# Patient Record
Sex: Male | Born: 1955 | Race: White | Hispanic: No | Marital: Single | Smoking: Former smoker
Health system: Southern US, Community
[De-identification: ages and names within clinical notes are randomized; demographics above are authoritative.]

## PROBLEM LIST (undated history)

## (undated) DIAGNOSIS — I10 Essential (primary) hypertension: Secondary | ICD-10-CM

## (undated) DIAGNOSIS — E785 Hyperlipidemia, unspecified: Secondary | ICD-10-CM

## (undated) DIAGNOSIS — I2510 Atherosclerotic heart disease of native coronary artery without angina pectoris: Secondary | ICD-10-CM

## (undated) DIAGNOSIS — I209 Angina pectoris, unspecified: Secondary | ICD-10-CM

## (undated) DIAGNOSIS — N289 Disorder of kidney and ureter, unspecified: Secondary | ICD-10-CM

## (undated) DIAGNOSIS — I219 Acute myocardial infarction, unspecified: Secondary | ICD-10-CM

## (undated) HISTORY — PX: CORONARY ANGIOPLASTY WITH STENT PLACEMENT: SHX49

## (undated) HISTORY — PX: CAROTID ENDARTERECTOMY: SUR193

## (undated) HISTORY — PX: LITHOTRIPSY: SUR834

---

## 2004-07-07 ENCOUNTER — Emergency Department: Payer: Self-pay | Admitting: Emergency Medicine

## 2004-07-07 ENCOUNTER — Other Ambulatory Visit: Payer: Self-pay

## 2004-07-24 ENCOUNTER — Emergency Department: Payer: Self-pay | Admitting: Internal Medicine

## 2008-03-19 ENCOUNTER — Emergency Department: Payer: Self-pay | Admitting: Emergency Medicine

## 2008-03-27 ENCOUNTER — Ambulatory Visit: Payer: Self-pay | Admitting: Specialist

## 2008-05-02 ENCOUNTER — Ambulatory Visit: Payer: Self-pay | Admitting: Specialist

## 2008-05-09 ENCOUNTER — Ambulatory Visit: Payer: Self-pay | Admitting: Specialist

## 2010-11-22 IMAGING — CT CT STONE STUDY
1 of 2 series · 15 of 32 positions shown, 19 images · non-contrast
Comparison: none

RESULT:      Emergent noncontrast CT of the abdomen and pelvis is performed
utilizing a renal stone protocol. Images were reconstructed in the axial
plane at 3 mm slice thickness. Multiplanar and 3 dimensional reconstructions
are performed at the time of dictation utilizing the web space software area
there is no previous exam for comparison.

The prostate is enlarged and contains calcifications. The urinary bladder is
not significantly distended. There are no bladder calculi evident. There is
no urinary obstruction. There is no ascites, abnormal bowel distention,
abnormal fluid collection or free air. The patient does have an infrarenal
abdominal aortic aneurysm demonstrating an AP dimension of 2.6 cm with a
transverse dimension of 2.7 cm. This tapers to normal diameter prior to the
aortic bifurcation. No definite urinary tract stones are evident.
The upper abdominal structures are otherwise unremarkable. The adrenal
glands are normal. .The cecum is anterior in position just to the right of
midline there is no evidence of acute appendicitis. There is no adenopathy.

[Series 2: stone · axial · 0.74mm/px · z∈[-1028,-632]mm · 15 of 150 slices shown, 19 images]
[im 12/150  soft-tissue]
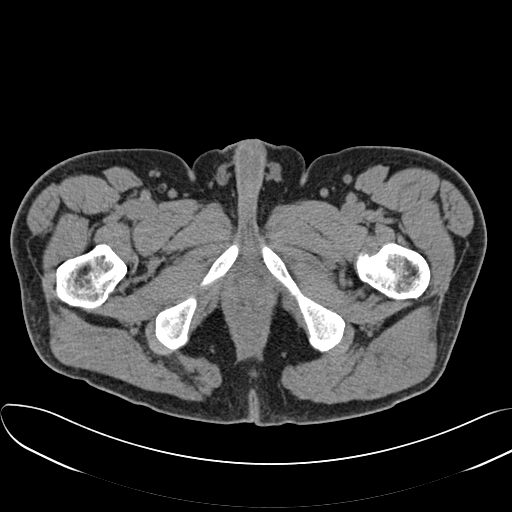
[im 12/150  bone]
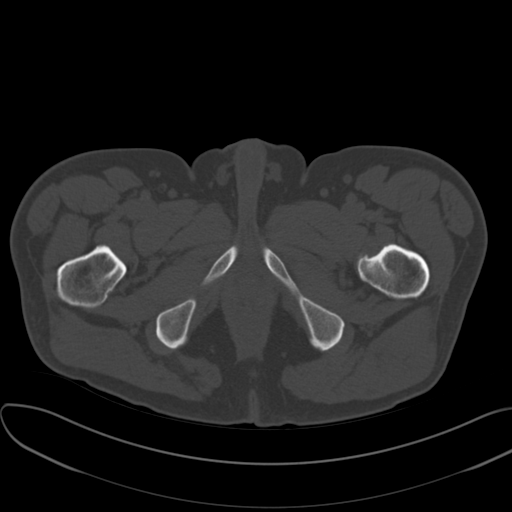
[im 23/150  soft-tissue]
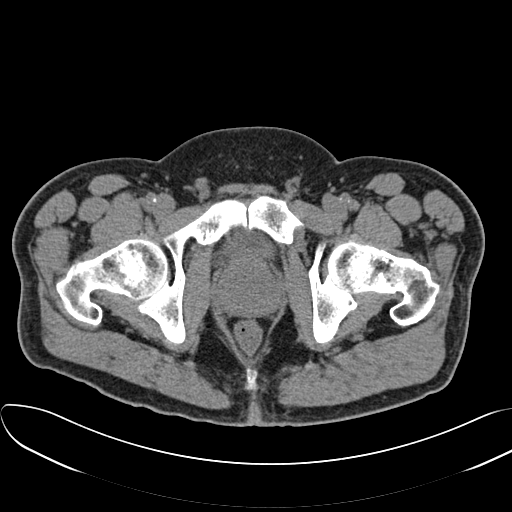
[im 34/150  soft-tissue]
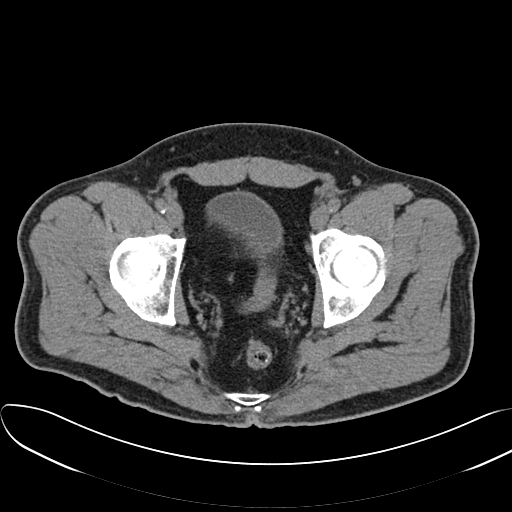
[im 45/150  soft-tissue]
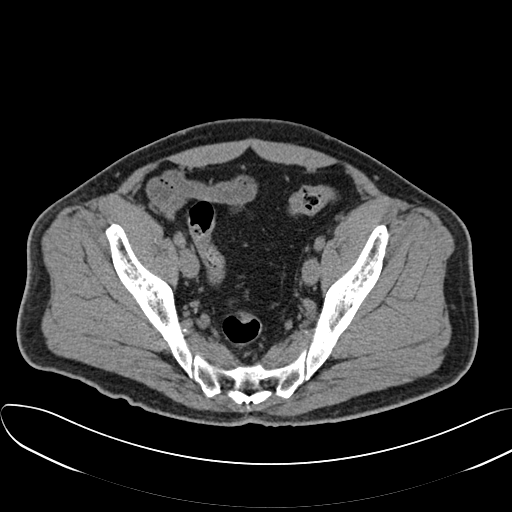
[im 56/150  soft-tissue]
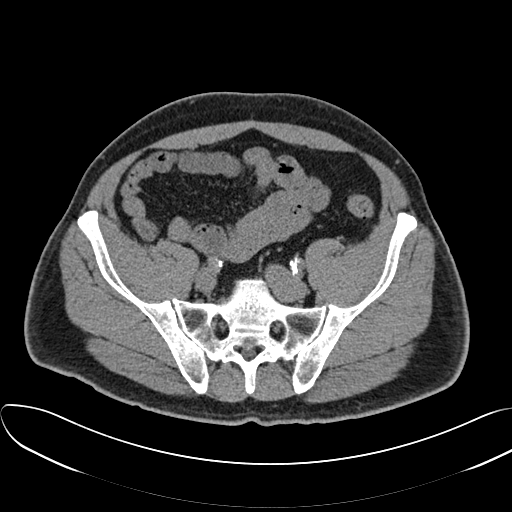
[im 67/150  soft-tissue]
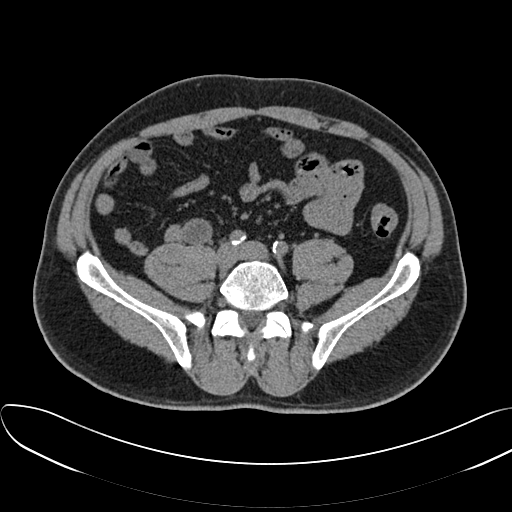
[im 78/150  soft-tissue]
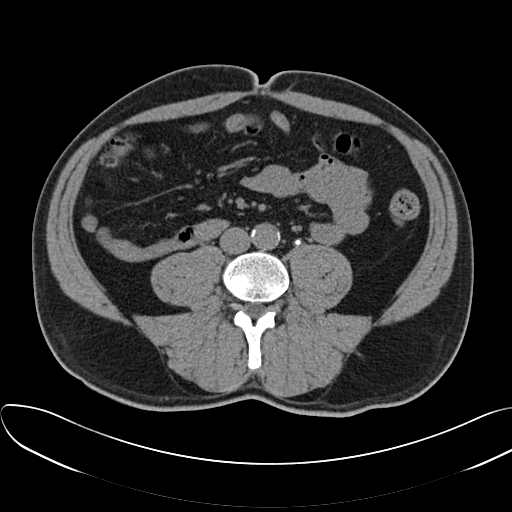
[im 89/150  soft-tissue]
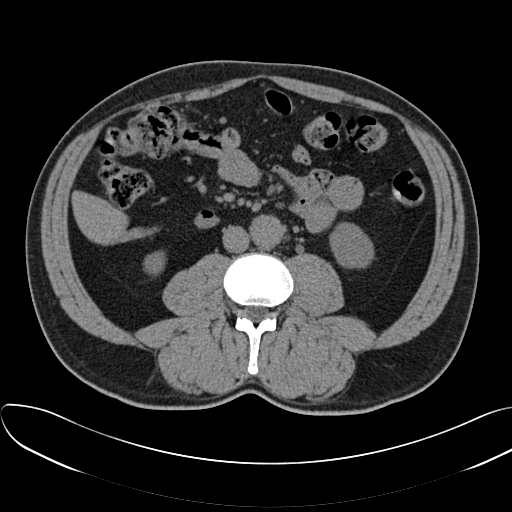
[im 100/150  soft-tissue]
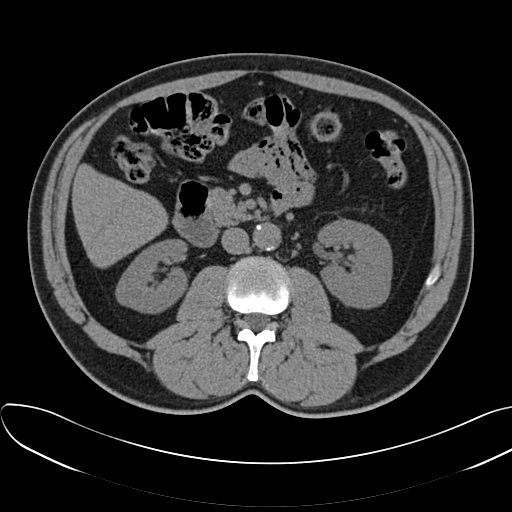
[im 100/150  bone]
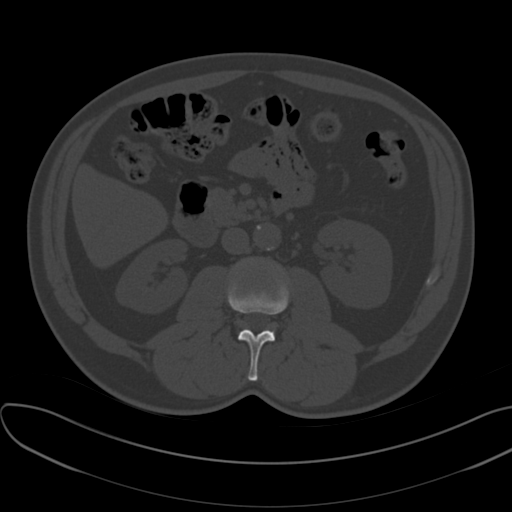
[im 111/150  soft-tissue]
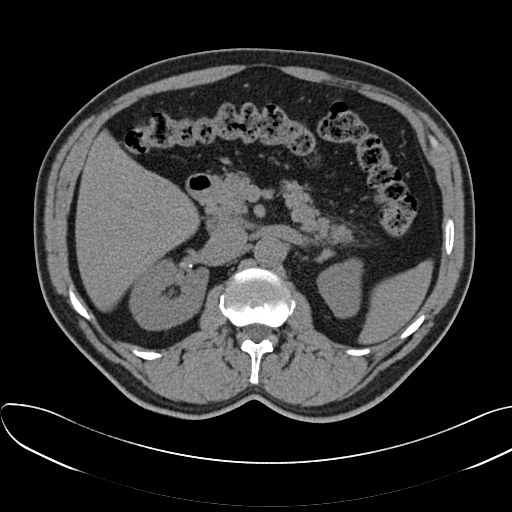
[im 122/150  soft-tissue]
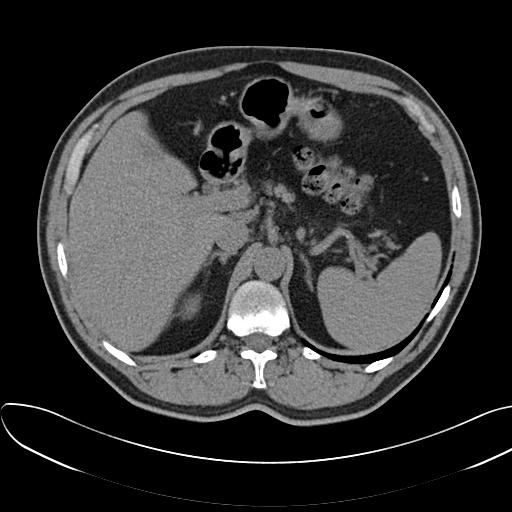
[im 127/150  lung]
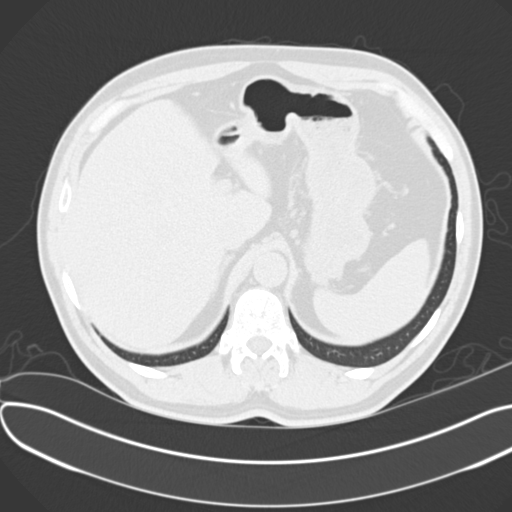
[im 133/150  soft-tissue]
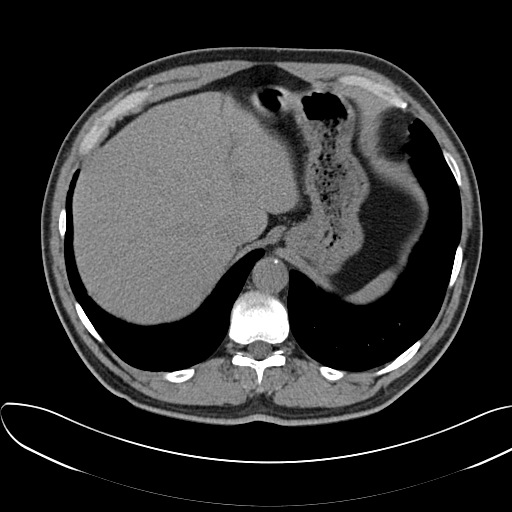
[im 133/150  lung]
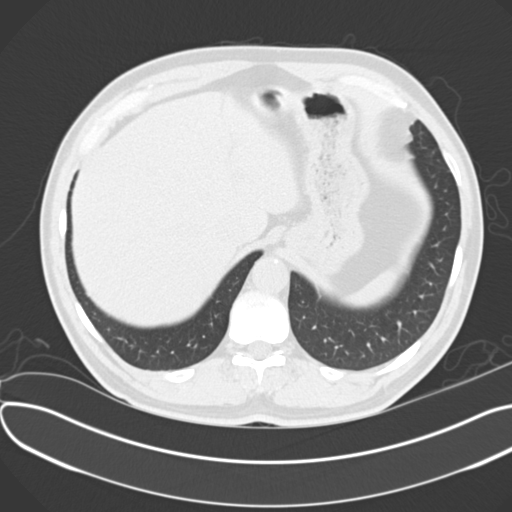
[im 138/150  lung]
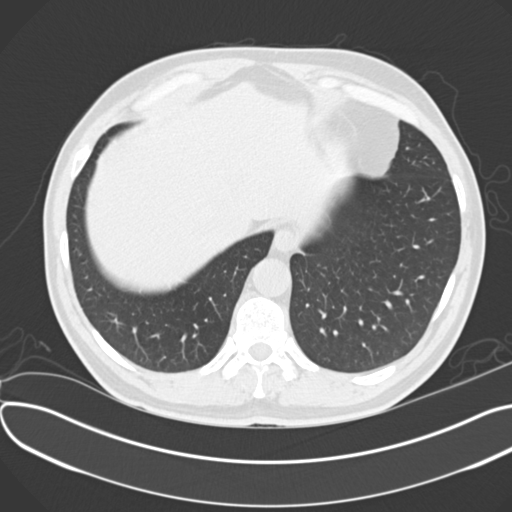
[im 144/150  soft-tissue]
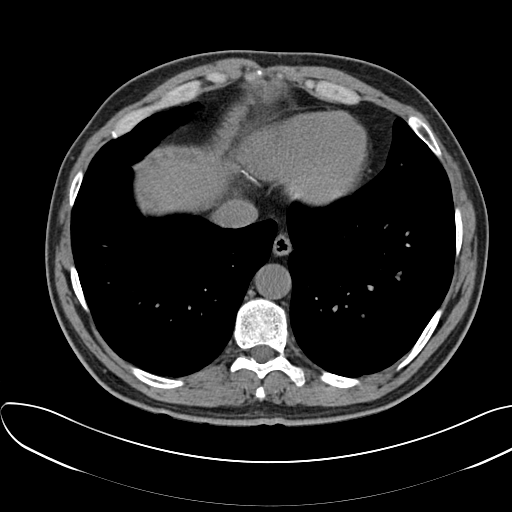
[im 144/150  lung]
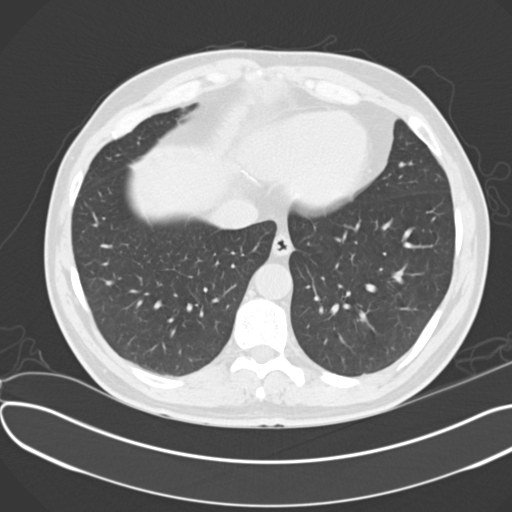

[15 of 32 positions shown; findings below may reference images not displayed]

IMPRESSION: 1. No urinary calculi or obstructive change.
2. Minimal abdominal aortic aneurysm in the infrarenal suprailiac aorta.
Atherosclerotic calcification is present.
3. No evidence of bowel obstruction or perforation.

An addendum: There is a stone in the proximal left ureter best seen on image
73 measuring approximately 3 mm in diameter. There is no obstructive change.

## 2011-01-08 ENCOUNTER — Ambulatory Visit: Payer: Self-pay | Admitting: Cardiology

## 2011-07-06 ENCOUNTER — Ambulatory Visit: Payer: Self-pay | Admitting: Ophthalmology

## 2013-05-11 ENCOUNTER — Emergency Department: Payer: Self-pay | Admitting: Emergency Medicine

## 2013-05-12 LAB — URINALYSIS, COMPLETE

## 2013-05-13 LAB — URINE CULTURE

## 2014-06-26 DIAGNOSIS — E782 Mixed hyperlipidemia: Secondary | ICD-10-CM | POA: Insufficient documentation

## 2014-06-26 DIAGNOSIS — I2510 Atherosclerotic heart disease of native coronary artery without angina pectoris: Secondary | ICD-10-CM | POA: Insufficient documentation

## 2014-06-26 DIAGNOSIS — I25119 Atherosclerotic heart disease of native coronary artery with unspecified angina pectoris: Secondary | ICD-10-CM | POA: Diagnosis present

## 2014-06-26 DIAGNOSIS — Z8679 Personal history of other diseases of the circulatory system: Secondary | ICD-10-CM | POA: Diagnosis present

## 2015-04-07 ENCOUNTER — Emergency Department
Admission: EM | Admit: 2015-04-07 | Discharge: 2015-04-08 | Disposition: A | Discharge status: Home / Self Care | Payer: BC Managed Care – PPO | Attending: Emergency Medicine | Admitting: Emergency Medicine

## 2015-04-07 ENCOUNTER — Emergency Department: Payer: BC Managed Care – PPO

## 2015-04-07 DIAGNOSIS — I2510 Atherosclerotic heart disease of native coronary artery without angina pectoris: Secondary | ICD-10-CM | POA: Diagnosis not present

## 2015-04-07 DIAGNOSIS — R0602 Shortness of breath: Secondary | ICD-10-CM | POA: Diagnosis not present

## 2015-04-07 DIAGNOSIS — I252 Old myocardial infarction: Secondary | ICD-10-CM | POA: Insufficient documentation

## 2015-04-07 DIAGNOSIS — R079 Chest pain, unspecified: Secondary | ICD-10-CM | POA: Insufficient documentation

## 2015-04-07 DIAGNOSIS — Z7902 Long term (current) use of antithrombotics/antiplatelets: Secondary | ICD-10-CM | POA: Insufficient documentation

## 2015-04-07 DIAGNOSIS — R110 Nausea: Secondary | ICD-10-CM | POA: Diagnosis not present

## 2015-04-07 DIAGNOSIS — Z79899 Other long term (current) drug therapy: Secondary | ICD-10-CM | POA: Insufficient documentation

## 2015-04-07 DIAGNOSIS — I10 Essential (primary) hypertension: Secondary | ICD-10-CM | POA: Diagnosis not present

## 2015-04-07 HISTORY — DX: Acute myocardial infarction, unspecified: I21.9

## 2015-04-07 HISTORY — DX: Hyperlipidemia, unspecified: E78.5

## 2015-04-07 HISTORY — DX: Atherosclerotic heart disease of native coronary artery without angina pectoris: I25.10

## 2015-04-07 HISTORY — DX: Essential (primary) hypertension: I10

## 2015-04-07 LAB — TROPONIN I: Troponin I: <0.03 ng/mL (ref <0.031)

## 2015-04-07 LAB — BASIC METABOLIC PANEL
Anion gap: 6 (ref 5–15)
BUN: 17 mg/dL (ref 6–20)
CALCIUM: 10.2 mg/dL (ref 8.9–10.3)
CO2: 27 mmol/L (ref 22–32)
Chloride: 106 mmol/L (ref 101–111)
Creatinine, Ser: 0.93 mg/dL (ref 0.61–1.24)
GFR calc non Af Amer: >60 mL/min (ref >60)
Glucose, Bld: 137 mg/dL — ABNORMAL HIGH (ref 65–99)
Potassium: 3.9 mmol/L (ref 3.5–5.1)
SODIUM: 139 mmol/L (ref 135–145)

## 2015-04-07 LAB — CBC
HEMATOCRIT: 47.9 % (ref 40.0–52.0)
HEMOGLOBIN: 16.5 g/dL (ref 13.0–18.0)
MCH: 30.9 pg (ref 26.0–34.0)
MCHC: 34.4 g/dL (ref 32.0–36.0)
MCV: 89.9 fL (ref 80.0–100.0)
Platelets: 213 10*3/uL (ref 150–440)
RBC: 5.33 MIL/uL (ref 4.40–5.90)
RDW: 14.2 % (ref 11.5–14.5)
WBC: 9.4 10*3/uL (ref 3.8–10.6)

## 2015-04-07 MED ORDER — NITROGLYCERIN 0.4 MG SL SUBL
0.4000 mg | SUBLINGUAL_TABLET | SUBLINGUAL | Status: DC | PRN
  Administered 2015-04-08: 0.4 mg via SUBLINGUAL
  Filled 2015-04-07: qty 1

## 2015-04-07 MED ORDER — ASPIRIN 81 MG PO CHEW
324.0000 mg | CHEWABLE_TABLET | Freq: Once | ORAL | Status: AC
  Administered 2015-04-08: 324 mg via ORAL
  Filled 2015-04-07: qty 4

## 2015-04-07 NOTE — ED Provider Notes (Signed)
[facility] Emergency Department Provider Note  ____________________________________________  Time seen: Approximately 2339 PM  I have reviewed the triage vital signs and the nursing notes.   HISTORY  Chief Complaint Chest Pain    HPI Dennis Church is a 60 y.o. male ration is a 59 year old male who comes in today with chest pain. The patient reports that the pain started this evening and he wasn't doing anything very strenuous. The patient reports he feels sore in the left side of his chest and in the back of his left arm. The patient took his regular medicines today but did not take any nitroglycerin when the pain started. The patient does have a history of an MI with a stent placed about 10 years ago. The patient reports that the pain has eased off some since he is arrived and he rates his pain a 5-6 out of 10 in intensity. He reports that the pain feels sore and achy and just won't go away. He is also having some pressure in the left side of his chest. He reports that this does not feel like his previous MI but at the time he felt pinching in his chest. The patient is concerned that there is some blockage and he sees Dr. Ihor Austin is his cardiologist. The patient denies any active shortness of breath but does have some intermittent shortness of breath. He also denies any sweats but has some nausea.   Past Medical History  Diagnosis Date  . Coronary artery disease   . Hypertension   . Hyperlipidemia   . Myocardial infarction (HCC)     There are no active problems to display for this patient.   Past Surgical History  Procedure Laterality Date  . Coronary angioplasty with stent placement    . Carotid endarterectomy Left     Current Outpatient Rx  Name  Route  Sig  Dispense  Refill  . atorvastatin (LIPITOR) 80 MG tablet   Oral   Take 80 mg by mouth every evening.         . clopidogrel (PLAVIX) 75 MG tablet   Oral   Take 75 mg by mouth daily.          . metoprolol tartrate (LOPRESSOR) 25 MG tablet   Oral   Take 25 mg by mouth 2 (two) times daily.           Allergies Review of patient's allergies indicates no known allergies.  No family history on file.  Social History Social History  Substance Use Topics  . Smoking status: Not on file  . Smokeless tobacco: Not on file  . Alcohol Use: No    Review of Systems Constitutional: No fever/chills Eyes: No visual changes. ENT: No sore throat. Cardiovascular: chest pain. Respiratory: Intermittent shortness of breath. Gastrointestinal: Nausea with No abdominal pain, no vomiting.  No diarrhea.  No constipation. Genitourinary: Negative for dysuria. Musculoskeletal: Negative for back pain. Skin: Negative for rash. Neurological: Negative for headaches, focal weakness or numbness.  10-point ROS otherwise negative.  ____________________________________________   PHYSICAL EXAM:  VITAL SIGNS: ED Triage Vitals  Enc Vitals Group     BP 04/07/15 2100 207/93 mmHg     Pulse Rate 04/07/15 2100 79     Resp 04/07/15 2100 20     Temp 04/07/15 2100 98.6 F (37 C)     Temp Source 04/07/15 2100 Oral     SpO2 04/07/15 2100 95 %     Weight 04/07/15 2100 175 lb (  79.379 kg)     Height 04/07/15 2100 5\' 10"  (1.778 m)     Head Cir --      Peak Flow --      Pain Score 04/07/15 2307 7     Pain Loc --      Pain Edu? --      Excl. in GC? --     Constitutional: Alert and oriented. Well appearing and in mild distress. Eyes: Conjunctivae are normal. PERRL. EOMI. Head: Atraumatic. Nose: No congestion/rhinnorhea. Mouth/Throat: Mucous membranes are moist.  Oropharynx non-erythematous. Cardiovascular: Normal rate, regular rhythm. Grossly normal heart sounds.  Good peripheral circulation. Respiratory: Normal respiratory effort.  No retractions. Lungs CTAB. Gastrointestinal: Soft and nontender. No distention. Positive bowel sounds Musculoskeletal: No lower extremity tenderness nor edema.    Neurologic:  Normal speech and language. No gross focal neurologic deficits are appreciated.  Skin:  Skin is warm, dry and intact.  Psychiatric: Mood and affect are normal.   ____________________________________________   LABS (all labs ordered are listed, but only abnormal results are displayed)  Labs Reviewed  BASIC METABOLIC PANEL - Abnormal; Notable for the following:    Glucose, Bld 137 (*)    All other components within normal limits  CBC  TROPONIN I  TROPONIN I   ____________________________________________  EKG  ED ECG REPORT I, Rebecka ApleyWebster,  Allison P, the attending physician, personally viewed and interpreted this ECG.   Date: 04/07/2015  EKG Time: 2059  Rate: 76  Rhythm: normal sinus rhythm  Axis: Normal  Intervals:none  ST&T Change: Q waves in leads II, III, and F aVF which are present on an EKG from 2006, no ST segment elevation.  ____________________________________________  RADIOLOGY  Chest x-ray: No active cardiopulmonary disease ____________________________________________   PROCEDURES  Procedure(s) performed: None  Critical Care performed: No  ____________________________________________   INITIAL IMPRESSION / ASSESSMENT AND PLAN / ED COURSE  Pertinent labs & imaging results that were available during my care of the patient were reviewed by me and considered in my medical decision making (see chart for details).  This is a 60 year old male who comes into the hospital today with chest pain. The patient does have a history of MI and an atypical presentation. The patient is continuing to have some mild pains I will give him a dose of nitroglycerin as well as an aspirin. I will repeat the patient's troponin and then reassess the patient to determine if his symptoms are improved.  The patient did continue to have some chest discomfort after the aspirin and the nitroglycerin. I did give him some tramadol, a GI cocktail and a dose of Ativan 0.5 mg  orally. The patient's repeat troponin was unremarkable. The patient will be discharged home to follow-up with Dr. Lady GaryFath tomorrow for further evaluation with a stress test. The patient's chest pain is improved and he feels comfortable being discharged to home. ____________________________________________   FINAL CLINICAL IMPRESSION(S) / ED DIAGNOSES  Final diagnoses:  Chest pain, unspecified chest pain type      Rebecka ApleyAllison P Webster, MD 04/08/15 [phone]

## 2015-04-07 NOTE — ED Notes (Signed)
Blood drawn, and EKG ran by Paramedic student DEE

## 2015-04-07 NOTE — ED Notes (Addendum)
Pt to triage via w/c with no distress noted; pt reports left sided CP radiating into left arm this evening with no accomp symptoms; st hx of same; pt st he is unable to tell me what is pain level is

## 2015-04-08 ENCOUNTER — Encounter: Payer: Self-pay | Admitting: Emergency Medicine

## 2015-04-08 LAB — TROPONIN I: Troponin I: <0.03 ng/mL (ref <0.031)

## 2015-04-08 MED ORDER — TRAMADOL HCL 50 MG PO TABS
50.0000 mg | ORAL_TABLET | Freq: Once | ORAL | Status: AC
  Administered 2015-04-08: 50 mg via ORAL
  Filled 2015-04-08: qty 1

## 2015-04-08 MED ORDER — GI COCKTAIL ~~LOC~~
30.0000 mL | Freq: Once | ORAL | Status: AC
  Administered 2015-04-08: 30 mL via ORAL
  Filled 2015-04-08: qty 30

## 2015-04-08 MED ORDER — LORAZEPAM 0.5 MG PO TABS
0.5000 mg | ORAL_TABLET | Freq: Once | ORAL | Status: AC
  Administered 2015-04-08: 0.5 mg via ORAL
  Filled 2015-04-08: qty 1

## 2015-04-08 NOTE — Discharge Instructions (Signed)
Nonspecific Chest Pain  °Chest pain can be caused by many different conditions. There is always a chance that your pain could be related to something serious, such as a heart attack or a blood clot in your lungs. Chest pain can also be caused by conditions that are not life-threatening. If you have chest pain, it is very important to follow up with your health care provider. °CAUSES  °Chest pain can be caused by: °· Heartburn. °· Pneumonia or bronchitis. °· Anxiety or stress. °· Inflammation around your heart (pericarditis) or lung (pleuritis or pleurisy). °· A blood clot in your lung. °· A collapsed lung (pneumothorax). It can develop suddenly on its own (spontaneous pneumothorax) or from trauma to the chest. °· Shingles infection (varicella-zoster virus). °· Heart attack. °· Damage to the bones, muscles, and cartilage that make up your chest wall. This can include: °¨ Bruised bones due to injury. °¨ Strained muscles or cartilage due to frequent or repeated coughing or overwork. °¨ Fracture to one or more ribs. °¨ Sore cartilage due to inflammation (costochondritis). °RISK FACTORS  °Risk factors for chest pain may include: °· Activities that increase your risk for trauma or injury to your chest. °· Respiratory infections or conditions that cause frequent coughing. °· Medical conditions or overeating that can cause heartburn. °· Heart disease or family history of heart disease. °· Conditions or health behaviors that increase your risk of developing a blood clot. °· Having had chicken pox (varicella zoster). °SIGNS AND SYMPTOMS °Chest pain can feel like: °· Burning or tingling on the surface of your chest or deep in your chest. °· Crushing, pressure, aching, or squeezing pain. °· Dull or sharp pain that is worse when you move, cough, or take a deep breath. °· Pain that is also felt in your back, neck, shoulder, or arm, or pain that spreads to any of these areas. °Your chest pain may come and go, or it may stay  constant. °DIAGNOSIS °Lab tests or other studies may be needed to find the cause of your pain. Your health care provider may have you take a test called an ambulatory ECG (electrocardiogram). An ECG records your heartbeat patterns at the time the test is performed. You may also have other tests, such as: °· Transthoracic echocardiogram (TTE). During echocardiography, sound waves are used to create a picture of all of the heart structures and to look at how blood flows through your heart. °· Transesophageal echocardiogram (TEE). This is a more advanced imaging test that obtains images from inside your body. It allows your health care provider to see your heart in finer detail. °· Cardiac monitoring. This allows your health care provider to monitor your heart rate and rhythm in real time. °· Holter monitor. This is a portable device that records your heartbeat and can help to diagnose abnormal heartbeats. It allows your health care provider to track your heart activity for several days, if needed. °· Stress tests. These can be done through exercise or by taking medicine that makes your heart beat more quickly. °· Blood tests. °· Imaging tests. °TREATMENT  °Your treatment depends on what is causing your chest pain. Treatment may include: °· Medicines. These may include: °¨ Acid blockers for heartburn. °¨ Anti-inflammatory medicine. °¨ Pain medicine for inflammatory conditions. °¨ Antibiotic medicine, if an infection is present. °¨ Medicines to dissolve blood clots. °¨ Medicines to treat coronary artery disease. °· Supportive care for conditions that do not require medicines. This may include: °¨ Resting. °¨ Applying heat   or cold packs to injured areas. °¨ Limiting activities until pain decreases. °HOME CARE INSTRUCTIONS °· If you were prescribed an antibiotic medicine, finish it all even if you start to feel better. °· Avoid any activities that bring on chest pain. °· Do not use any tobacco products, including  cigarettes, chewing tobacco, or electronic cigarettes. If you need help quitting, ask your health care provider. °· Do not drink alcohol. °· Take medicines only as directed by your health care provider. °· Keep all follow-up visits as directed by your health care provider. This is important. This includes any further testing if your chest pain does not go away. °· If heartburn is the cause for your chest pain, you may be told to keep your head raised (elevated) while sleeping. This reduces the chance that acid will go from your stomach into your esophagus. °· Make lifestyle changes as directed by your health care provider. These may include: °¨ Getting regular exercise. Ask your health care provider to suggest some activities that are safe for you. °¨ Eating a heart-healthy diet. A registered dietitian can help you to learn healthy eating options. °¨ Maintaining a healthy weight. °¨ Managing diabetes, if necessary. °¨ Reducing stress. °SEEK MEDICAL CARE IF: °· Your chest pain does not go away after treatment. °· You have a rash with blisters on your chest. °· You have a fever. °SEEK IMMEDIATE MEDICAL CARE IF:  °· Your chest pain is worse. °· You have an increasing cough, or you cough up blood. °· You have severe abdominal pain. °· You have severe weakness. °· You faint. °· You have chills. °· You have sudden, unexplained chest discomfort. °· You have sudden, unexplained discomfort in your arms, back, neck, or jaw. °· You have shortness of breath at any time. °· You suddenly start to sweat, or your skin gets clammy. °· You feel nauseous or you vomit. °· You suddenly feel light-headed or dizzy. °· Your heart begins to beat quickly, or it feels like it is skipping beats. °These symptoms may represent a serious problem that is an emergency. Do not wait to see if the symptoms will go away. Get medical help right away. Call your local emergency services (911 in the [place] Do not drive yourself to the hospital. °  °This  information is not intended to replace advice given to you by your health care provider. Make sure you discuss any questions you have with your health care provider. °  °Document Released: 12/30/2004 Document Revised: 04/12/2014 Document Reviewed: 10/26/2013 °Elsevier Interactive Patient Education ©2016 [employer] ° °

## 2015-04-24 ENCOUNTER — Other Ambulatory Visit: Payer: Self-pay | Admitting: Cardiology

## 2015-04-25 ENCOUNTER — Ambulatory Visit
Admission: RE | Admit: 2015-04-25 | Discharge: 2015-04-25 | Disposition: A | Discharge status: Home / Self Care | Payer: BC Managed Care – PPO | Source: Ambulatory Visit | Attending: Cardiology | Admitting: Cardiology

## 2015-04-25 ENCOUNTER — Encounter
Admission: RE | Disposition: A | Discharge status: Home / Self Care | Payer: Self-pay | Source: Ambulatory Visit | Attending: Cardiology

## 2015-04-25 ENCOUNTER — Encounter: Payer: Self-pay | Admitting: *Deleted

## 2015-04-25 DIAGNOSIS — I2510 Atherosclerotic heart disease of native coronary artery without angina pectoris: Secondary | ICD-10-CM | POA: Insufficient documentation

## 2015-04-25 DIAGNOSIS — R0602 Shortness of breath: Secondary | ICD-10-CM | POA: Diagnosis not present

## 2015-04-25 DIAGNOSIS — R079 Chest pain, unspecified: Secondary | ICD-10-CM | POA: Diagnosis present

## 2015-04-25 DIAGNOSIS — I10 Essential (primary) hypertension: Secondary | ICD-10-CM | POA: Diagnosis not present

## 2015-04-25 DIAGNOSIS — Z87891 Personal history of nicotine dependence: Secondary | ICD-10-CM | POA: Insufficient documentation

## 2015-04-25 DIAGNOSIS — Z8249 Family history of ischemic heart disease and other diseases of the circulatory system: Secondary | ICD-10-CM | POA: Insufficient documentation

## 2015-04-25 DIAGNOSIS — Z79899 Other long term (current) drug therapy: Secondary | ICD-10-CM | POA: Diagnosis not present

## 2015-04-25 DIAGNOSIS — E785 Hyperlipidemia, unspecified: Secondary | ICD-10-CM | POA: Insufficient documentation

## 2015-04-25 DIAGNOSIS — Z823 Family history of stroke: Secondary | ICD-10-CM | POA: Insufficient documentation

## 2015-04-25 DIAGNOSIS — Z7982 Long term (current) use of aspirin: Secondary | ICD-10-CM | POA: Diagnosis not present

## 2015-04-25 HISTORY — DX: Angina pectoris, unspecified: I20.9

## 2015-04-25 HISTORY — PX: CARDIAC CATHETERIZATION: SHX172

## 2015-04-25 SURGERY — LEFT HEART CATH AND CORONARY ANGIOGRAPHY
Anesthesia: Moderate Sedation | Laterality: Left

## 2015-04-25 MED ORDER — SODIUM CHLORIDE 0.9 % IV SOLN: 250.0000 mL | INTRAVENOUS | Status: DC | PRN

## 2015-04-25 MED ORDER — SODIUM CHLORIDE 0.9 % IJ SOLN: 3.0000 mL | INTRAMUSCULAR | Status: DC | PRN

## 2015-04-25 MED ORDER — SODIUM CHLORIDE 0.9 % IJ SOLN: 3.0000 mL | Freq: Two times a day (BID) | INTRAMUSCULAR | Status: DC

## 2015-04-25 MED ORDER — MIDAZOLAM HCL 2 MG/2ML IJ SOLN
INTRAMUSCULAR | Status: DC | PRN
  Administered 2015-04-25 (×2): 1 mg via INTRAVENOUS

## 2015-04-25 MED ORDER — FENTANYL CITRATE (PF) 100 MCG/2ML IJ SOLN
INTRAMUSCULAR | Status: AC
  Filled 2015-04-25: qty 2

## 2015-04-25 MED ORDER — HEPARIN (PORCINE) IN NACL 2-0.9 UNIT/ML-% IJ SOLN
INTRAMUSCULAR | Status: AC
  Filled 2015-04-25: qty 500

## 2015-04-25 MED ORDER — FENTANYL CITRATE (PF) 100 MCG/2ML IJ SOLN
INTRAMUSCULAR | Status: DC | PRN
  Administered 2015-04-25 (×2): 25 ug via INTRAVENOUS

## 2015-04-25 MED ORDER — ASPIRIN 81 MG PO CHEW: 81.0000 mg | CHEWABLE_TABLET | ORAL | Status: DC

## 2015-04-25 MED ORDER — SODIUM CHLORIDE 0.9 % WEIGHT BASED INFUSION: 1.0000 mL/kg/h | INTRAVENOUS | Status: DC

## 2015-04-25 MED ORDER — IOHEXOL 300 MG/ML  SOLN
INTRAMUSCULAR | Status: DC | PRN
  Administered 2015-04-25: 160 mL via INTRA_ARTERIAL

## 2015-04-25 MED ORDER — SODIUM CHLORIDE 0.9 % WEIGHT BASED INFUSION
3.0000 mL/kg/h | INTRAVENOUS | Status: DC
  Administered 2015-04-25: 3 mL/kg/h via INTRAVENOUS

## 2015-04-25 MED ORDER — MIDAZOLAM HCL 2 MG/2ML IJ SOLN
INTRAMUSCULAR | Status: AC
  Filled 2015-04-25: qty 2

## 2015-04-25 SURGICAL SUPPLY — 11 items
CATH INFINITI 5 FR 3DRC (CATHETERS) ×3 IMPLANT
CATH INFINITI 5FR AL1 (CATHETERS) ×3 IMPLANT
CATH INFINITI 5FR ANG PIGTAIL (CATHETERS) ×3 IMPLANT
CATH INFINITI 5FR JL4 (CATHETERS) ×3 IMPLANT
CATH INFINITI JR4 5F (CATHETERS) ×3 IMPLANT
DEVICE CLOSURE MYNXGRIP 5F (Vascular Products) ×3 IMPLANT
KIT MANI 3VAL PERCEP (MISCELLANEOUS) ×3 IMPLANT
NEEDLE PERC 18GX7CM (NEEDLE) ×3 IMPLANT
PACK CARDIAC CATH (CUSTOM PROCEDURE TRAY) ×3 IMPLANT
SHEATH PINNACLE 5F 10CM (SHEATH) ×3 IMPLANT
WIRE EMERALD 3MM-J .035X150CM (WIRE) ×3 IMPLANT

## 2015-04-25 NOTE — Discharge Instructions (Signed)

## 2015-04-25 NOTE — H&P (Signed)
Chief Complaint: Chief Complaint  Patient presents with  . Follow-up  had myoview  . Chest Pain  I feel rough today  . Shortness of Breath  I still have some  Date of Service: 04/23/2015 Date of Birth: 04/24/1955 PCP: External Referred to Provider  History of Present Illness: Dennis Church is a 60 y.o.male patient who returns for follow-up visit after presenting to the emergency room with chest pain. He ruled out for myocardial infarction during that visit. He has continued to have intermittent episodes of exertional chest pain with occasional radiation to his arm.. He has status post inferior myocardial infarction with cardiac catheterization at [facility] revealing an occluded RCA , the LAD disease treated with a stent. This was carried out in 2006 after anterior myocardial infarction. Cardiac catheterization in 2012 revealed a patent state in the LAD with occluded RCA. 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 There was high-grade disease in his circumflex felt best treated medically. He has been compliant with his medications. He now complains of exertional chest pain. He underwent a functional study which showed evidence of mild ischemia in the inferior leads. It is of note he has a chronically occluded RCA with collaterals from the left. Risk and benefits of left heart cat was discussed with the patient.  Past Medical and Surgical History  Past Medical History Past Medical History  Diagnosis Date  . Coronary artery disease  . Hyperlipidemia  . Hypertension   Past Surgical History He has no past surgical history on file.   Medications and Allergies  Current Medications  Current Outpatient Prescriptions  Medication Sig Dispense Refill  . aspirin 81 MG EC tablet Take 81 mg by mouth once daily.  Marland Kitchen atorvastatin (LIPITOR) 80 MG tablet Take 1 tablet (80 mg total) by mouth once daily. 30 tablet 11  . clopidogrel (PLAVIX) 75 mg  tablet Take 1 tablet (75 mg total) by mouth once daily. 30 tablet 11  . isosorbide mononitrate (IMDUR) 60 MG ER tablet Take 1 tablet (60 mg total) by mouth once daily. 30 tablet 11  . metoprolol tartrate (LOPRESSOR) 25 MG tablet Take 1 tablet (25 mg total) by mouth 2 (two) times daily. 60 tablet 11  . nitroGLYcerin (NITROSTAT) 0.4 MG SL tablet Place 0.4 mg under the tongue every 5 (five) minutes as needed for Chest pain. Reported on 04/23/2015   No current facility-administered medications for this visit.   Allergies: Review of patient's allergies indicates no known allergies.  Social and Family History  Social History reports that he has quit smoking. He does not have any smokeless tobacco history on file. He reports that he drinks alcohol. He reports that he does not use illicit drugs.  Family History Family History  Problem Relation Age of Onset  . Stroke Father 1  . Heart attack Mother 32   Review of Systems  Review of Systems  Constitutional: Negative for chills, diaphoresis, fever, malaise/fatigue and weight loss.  HENT: Negative for congestion, ear discharge, hearing loss and tinnitus.  Eyes: Negative for blurred vision.  Respiratory: Negative for cough, hemoptysis, sputum production, shortness of breath and wheezing.  Cardiovascular: Positive for chest pain. Negative for palpitations, orthopnea, claudication, leg swelling and PND.  Gastrointestinal: Negative for abdominal pain, blood in stool, constipation, diarrhea, heartburn, melena, nausea and vomiting.  Genitourinary: Negative for dysuria, frequency, hematuria and urgency.  Musculoskeletal: Negative for back pain, falls, joint pain and myalgias.  Skin: Negative for  itching and rash.  Neurological: Negative for dizziness, tingling, focal weakness, loss of consciousness, weakness and headaches.  Endo/Heme/Allergies: Negative for polydipsia. Does not bruise/bleed easily.  Psychiatric/Behavioral: Negative for depression,  memory loss and substance abuse. The patient is not nervous/anxious.    Physical Examination   Vitals: Visit Vitals  . BP 140/70 (BP Location: Left upper arm, Patient Position: Sitting, BP Cuff Size: Small Adult)  . Pulse 76  . Resp 12  . Ht 177.8 cm ( )  . Wt 76.7 kg (169 lb)  . BMI 24.25 kg/m2   Ht:177.8 cm ( ) Wt:76.7 kg (169 lb) ZOX:WRUE surface area is 1.95 meters squared. Body mass index is 24.25 kg/(m^2).  Wt Readings from Last 3 Encounters:  04/23/15 76.7 kg (169 lb)  04/18/15 76.7 kg (169 lb 3.2 oz)  11/13/14 74.8 kg (165 lb)   BP Readings from Last 3 Encounters:  04/23/15 140/70  04/18/15 (!) 172/110  11/13/14 (!) 150/100   General appearance appears in no acute distress  Head Mouth and Eye exam Normocephalic, without obvious abnormality, atraumatic Dentition is good Eyes appear anicteric   Neck exam Thyroid: normal  Nodes: no obvious adenopathy  LUNGS Breath Sounds: Normal Percussion: Normal  CARDIOVASCULAR JVP CV wave: no HJR: no Elevation at 90 degrees: None Carotid Pulse: normal pulsation bilaterally Bruit: None Apex: apical impulse normal  Auscultation Rhythm: normal sinus rhythm S1: normal S2: normal Clicks: no Rub: no Murmurs: no murmurs  Gallop: None ABDOMEN Liver enlargement: no Pulsatile aorta: no Ascites: no Bruits: no  EXTREMITIES Clubbing: no Edema: trace to 1+ bilateral pedal edema Pulses: peripheral pulses symmetrical Femoral Bruits: no Amputation: no SKIN Rash: no Cyanosis: no Embolic phemonenon: no Bruising: no NEURO Alert and Oriented to person, place and time: yes Non focal: yes  PSYCH: Pt appears to have normal affect  Diagnostic Studies Reviewed:  EKG EKG demonstrated normal sinus rhythm, nonspecific ST and T waves changes.  Assessment and Plan   60 y.o. male with  ICD-10-CM ICD-9-CM  1. Coronary artery disease involving native coronary artery of native heart with continued  exertional chest pain. Functional study showed mild inferior ischemia. Risk and benefits of left cardiac catheter explained to the patient he agrees to proceed. Further recommendations after cath is completed. I25.10 414.01 ECG 12-lead  2. Coronary artery disease involving native coronary artery of native heart with angina pectoris-as per above I25.119 414.01  413.9  3. Hyperlipidemia, mixed .-continue with atorvastatin at 80 in mg daily. LDL goal is less than 100 A54.0 272.2   Return in about 2 weeks (around 05/07/2015).  These notes generated with voice recognition software. I apologize for typographical errors.  Denton Ar, MD

## 2015-05-03 ENCOUNTER — Encounter: Payer: Self-pay | Admitting: Emergency Medicine

## 2015-05-03 ENCOUNTER — Emergency Department
Admission: EM | Admit: 2015-05-03 | Discharge: 2015-05-03 | Disposition: A | Discharge status: Home / Self Care | Payer: BC Managed Care – PPO | Attending: Emergency Medicine | Admitting: Emergency Medicine

## 2015-05-03 DIAGNOSIS — Z7982 Long term (current) use of aspirin: Secondary | ICD-10-CM | POA: Insufficient documentation

## 2015-05-03 DIAGNOSIS — I97630 Postprocedural hematoma of a circulatory system organ or structure following a cardiac catheterization: Secondary | ICD-10-CM

## 2015-05-03 DIAGNOSIS — Z87891 Personal history of nicotine dependence: Secondary | ICD-10-CM | POA: Insufficient documentation

## 2015-05-03 DIAGNOSIS — Z79899 Other long term (current) drug therapy: Secondary | ICD-10-CM | POA: Diagnosis not present

## 2015-05-03 DIAGNOSIS — Z7902 Long term (current) use of antithrombotics/antiplatelets: Secondary | ICD-10-CM | POA: Diagnosis not present

## 2015-05-03 DIAGNOSIS — I97610 Postprocedural hemorrhage and hematoma of a circulatory system organ or structure following a cardiac catheterization: Secondary | ICD-10-CM | POA: Diagnosis not present

## 2015-05-03 DIAGNOSIS — I10 Essential (primary) hypertension: Secondary | ICD-10-CM | POA: Insufficient documentation

## 2015-05-03 DIAGNOSIS — R1031 Right lower quadrant pain: Secondary | ICD-10-CM | POA: Diagnosis present

## 2015-05-03 NOTE — Discharge Instructions (Signed)
As we discussed, the small amount of oozing is due to a leftover bruise beneath the skin after the catheterization called a hematoma. You may cover with a loose dressing, but otherwise allowed to drain.  Return to the emergency department for any worsening condition including new or worsening pain, swelling, numbness or tingling, or large amount of bleeding.  See Dr. Lady Gary on Tuesday as scheduled.

## 2015-05-03 NOTE — ED Provider Notes (Signed)
[facility] Emergency Department Provider Note   ____________________________________________  Time seen: Approximately 9:30 AM I have reviewed the triage vital signs and the triage nursing note.  HISTORY  Chief Complaint Groin Pain   Historian Patient  HPI Dennis Church is a 60 y.o. male who had a cardiac catheterization through the right groin last Friday, 8 days ago, with Dr. Lady Gary. He reports that he had increased swelling compared to prior catheterizations as well as some increased pain in thatgroin after the catheterization, unique and different from prior catheterizations. He left the water proof dressing in place for almost one week. Today he noticed a tiny amount of oozing from the groin site. No increased swelling in fact it has been decreasing over the past 1 week. No increased pain. He is highly anxious about whether or not something is going to "let loose. "Follow-up appointment with cardiologist on Tuesday.    Past Medical History  Diagnosis Date  . Coronary artery disease   . Hypertension   . Hyperlipidemia   . Myocardial infarction (HCC)   . Anginal pain [facility])     Patient Active Problem List   Diagnosis Date Noted  . CAD in native artery 06/26/2014  . Combined fat and carbohydrate induced hyperlipemia 06/26/2014    Past Surgical History  Procedure Laterality Date  . Coronary angioplasty with stent placement    . Carotid endarterectomy Left   . Lithotripsy    . Cardiac catheterization Left 04/25/2015    Procedure: Left Heart Cath and Coronary Angiography;  Surgeon: Dalia Heading, MD;  Location: [facility] INVASIVE CV LAB;  Service: Cardiovascular;  Laterality: Left;    Current Outpatient Rx  Name  Route  Sig  Dispense  Refill  . aspirin 81 MG tablet   Oral   Take 81 mg by mouth daily.         Marland Kitchen atorvastatin (LIPITOR) 80 MG tablet   Oral   Take 80 mg by mouth every evening.         . clopidogrel (PLAVIX) 75 MG tablet   Oral  Take 75 mg by mouth daily.         . isosorbide mononitrate (IMDUR) 60 MG 24 hr tablet   Oral   Take 60 mg by mouth daily.         . metoprolol tartrate (LOPRESSOR) 25 MG tablet   Oral   Take 25 mg by mouth 2 (two) times daily.           Allergies Review of patient's allergies indicates no known allergies.  History reviewed. No pertinent family history.  Social History Social History  Substance Use Topics  . Smoking status: Former Games developer  . Smokeless tobacco: None  . Alcohol Use: No    Review of Systems  Constitutional: Negative for fever. Eyes: Negative for visual changes. ENT: Negative for sore throat. Cardiovascular: Negative for chest pain. Respiratory: Negative for shortness of breath. Gastrointestinal: Negative for abdominal pain, vomiting and diarrhea. Genitourinary: Negative for dysuria. Musculoskeletal: Negative for back pain. Skin: Negative for rash. Neurological: Negative for headache. 10 point Review of Systems otherwise negative ____________________________________________   PHYSICAL EXAM:  VITAL SIGNS: ED Triage Vitals  Enc Vitals Group     BP 05/03/15 0850 217/119 mmHg     Pulse Rate 05/03/15 0850 73     Resp 05/03/15 0850 18     Temp 05/03/15 0850 98 F (36.7 C)     Temp Source 05/03/15 0850 Oral  SpO2 05/03/15 0850 99 %     Weight 05/03/15 0850 174 lb (78.926 kg)     Height 05/03/15 0850  (1.778 m)     Head Cir --      Peak Flow --      Pain Score 05/03/15 0851 0     Pain Loc --      Pain Edu? --      Excl. in GC? --      Constitutional: Alert and oriented. Well appearing and in no distress. Eyes: Conjunctivae are normal. PERRL. Normal extraocular movements. ENT   Head: Normocephalic and atraumatic.   Nose: No congestion/rhinnorhea.   Mouth/Throat: Mucous membranes are moist.   Neck: No stridor. Cardiovascular/Chest: Normal rate, regular rhythm.  No murmurs, rubs, or gallops. Respiratory: Normal  respiratory effort without tachypnea nor retractions. Breath sounds are clear and equal bilaterally. No wheezes/rales/rhonchi. Gastrointestinal: Soft. No distention, no guarding, no rebound. Nontender.    Genitourinary/rectal: Right groin has a small marble-sized induration that was able to express a tiny amount of dark blood/hematoma, approximately 1 mL total. No significant tenderness. Musculoskeletal: Nontender with normal range of motion in all extremities. No joint effusions.  No lower extremity tenderness.  No edema. Neurologic:  Normal speech and language. No gross or focal neurologic deficits are appreciated. Skin:  Skin is warm, dry and intact. No rash noted. Psychiatric: Mood and affect are normal. Speech and behavior are normal. Patient exhibits appropriate insight and judgment.  ____________________________________________   EKG I, Governor Rooks, MD, the attending physician have personally viewed and interpreted all ECGs.  None ____________________________________________  LABS (pertinent positives/negatives)  None  ____________________________________________  RADIOLOGY All Xrays were viewed by me. Imaging interpreted by Radiologist.  None __________________________________________  PROCEDURES  Procedure(s) performed: None  Critical Care performed: None  ____________________________________________   ED COURSE / ASSESSMENT AND PLAN  Pertinent labs & imaging results that were available during my care of the patient were reviewed by me and considered in my medical decision making (see chart for details).   In visualizing the location, it actually looks like a healing site after a recent catheterization. There is a tiny punctate hole in the skin where by a tiny amount of dark blood intermittently uses. I was able to express a small pocket. Symptoms consistent with a hematoma that is draining. I discussed with Dr. Gwen Pounds who is on call, and he recommended leaving  it open versus covering with Dermabond.  No evidence of infection or acute/active arterial bleeding.     CONSULTATIONS:   Phone consultation with Dr. Gwen Pounds, cardiology.   Patient / Family / Caregiver informed of clinical course, medical decision-making process, and agree with plan.   I discussed return precautions, follow-up instructions, and discharged instructions with patient and/or family.  Discharge instructions: As we discussed, the small amount of oozing is due to a leftover bruise beneath the skin after the catheterization called a hematoma. He may cover with a loose dressing, but otherwise allowed to drain.  Return to the emergency department for any worsening condition including new or worsening pain, swelling, numbness or tingling, or large amount of bleeding. ___________________________________________   FINAL CLINICAL IMPRESSION(S) / ED DIAGNOSES   Final diagnoses:  Postoperative hematoma involving circulatory system following cardiac catheterization              Note: This dictation was prepared with Dragon dictation. Any transcriptional errors that result from this process are unintentional   Governor Rooks, MD 05/03/15 1017

## 2015-05-03 NOTE — ED Notes (Signed)
Pt reports cath on last Friday, today having bleeding at the site in right groin.

## 2015-05-03 NOTE — ED Notes (Signed)
MD at bedside. 

## 2016-07-12 ENCOUNTER — Emergency Department: Payer: BC Managed Care – PPO

## 2016-07-12 ENCOUNTER — Encounter: Payer: Self-pay | Admitting: *Deleted

## 2016-07-12 ENCOUNTER — Emergency Department
Admission: EM | Admit: 2016-07-12 | Discharge: 2016-07-12 | Discharge status: Against Medical Advice | Payer: BC Managed Care – PPO | Attending: Emergency Medicine | Admitting: Emergency Medicine

## 2016-07-12 DIAGNOSIS — R001 Bradycardia, unspecified: Secondary | ICD-10-CM

## 2016-07-12 DIAGNOSIS — M79622 Pain in left upper arm: Secondary | ICD-10-CM | POA: Diagnosis not present

## 2016-07-12 DIAGNOSIS — Z79899 Other long term (current) drug therapy: Secondary | ICD-10-CM | POA: Insufficient documentation

## 2016-07-12 DIAGNOSIS — I2510 Atherosclerotic heart disease of native coronary artery without angina pectoris: Secondary | ICD-10-CM | POA: Diagnosis not present

## 2016-07-12 DIAGNOSIS — R0789 Other chest pain: Secondary | ICD-10-CM | POA: Insufficient documentation

## 2016-07-12 DIAGNOSIS — Z7982 Long term (current) use of aspirin: Secondary | ICD-10-CM | POA: Diagnosis not present

## 2016-07-12 DIAGNOSIS — I252 Old myocardial infarction: Secondary | ICD-10-CM | POA: Diagnosis not present

## 2016-07-12 DIAGNOSIS — R079 Chest pain, unspecified: Secondary | ICD-10-CM

## 2016-07-12 DIAGNOSIS — I10 Essential (primary) hypertension: Secondary | ICD-10-CM | POA: Diagnosis not present

## 2016-07-12 DIAGNOSIS — M79602 Pain in left arm: Secondary | ICD-10-CM

## 2016-07-12 DIAGNOSIS — R0602 Shortness of breath: Secondary | ICD-10-CM | POA: Diagnosis not present

## 2016-07-12 DIAGNOSIS — Z87891 Personal history of nicotine dependence: Secondary | ICD-10-CM | POA: Diagnosis not present

## 2016-07-12 LAB — BASIC METABOLIC PANEL
Anion gap: 7 (ref 5–15)
BUN: 16 mg/dL (ref 6–20)
CALCIUM: 9.7 mg/dL (ref 8.9–10.3)
CO2: 23 mmol/L (ref 22–32)
CREATININE: 1.27 mg/dL — AB (ref 0.61–1.24)
Chloride: 104 mmol/L (ref 101–111)
GFR calc Af Amer: >60 mL/min (ref >60)
GFR, EST NON AFRICAN AMERICAN: 58 mL/min — AB (ref >60)
GLUCOSE: 110 mg/dL — AB (ref 65–99)
Potassium: 4.4 mmol/L (ref 3.5–5.1)
Sodium: 134 mmol/L — ABNORMAL LOW (ref 135–145)

## 2016-07-12 LAB — CBC
HCT: 46.9 % (ref 40.0–52.0)
Hemoglobin: 16.1 g/dL (ref 13.0–18.0)
MCH: 30 pg (ref 26.0–34.0)
MCHC: 34.3 g/dL (ref 32.0–36.0)
MCV: 87.6 fL (ref 80.0–100.0)
PLATELETS: 199 10*3/uL (ref 150–440)
RBC: 5.35 MIL/uL (ref 4.40–5.90)
RDW: 14.7 % — AB (ref 11.5–14.5)
WBC: 8.7 10*3/uL (ref 3.8–10.6)

## 2016-07-12 LAB — TROPONIN I

## 2016-07-12 MED ORDER — NITROGLYCERIN 0.4 MG SL SUBL
0.4000 mg | SUBLINGUAL_TABLET | SUBLINGUAL | Status: DC | PRN
  Administered 2016-07-12 (×2): 0.4 mg via SUBLINGUAL
  Filled 2016-07-12: qty 1

## 2016-07-12 MED ORDER — IOPAMIDOL (ISOVUE-370) INJECTION 76%
75.0000 mL | Freq: Once | INTRAVENOUS | Status: AC | PRN
  Administered 2016-07-12: 75 mL via INTRAVENOUS

## 2016-07-12 MED ORDER — ASPIRIN 81 MG PO CHEW
324.0000 mg | CHEWABLE_TABLET | Freq: Once | ORAL | Status: AC
  Administered 2016-07-12: 324 mg via ORAL
  Filled 2016-07-12: qty 4

## 2016-07-12 MED ORDER — SODIUM CHLORIDE 0.9 % IV BOLUS (SEPSIS)
1000.0000 mL | Freq: Once | INTRAVENOUS | Status: AC
  Administered 2016-07-12: 1000 mL via INTRAVENOUS

## 2016-07-12 NOTE — Discharge Instructions (Signed)
Please continue to take all your medications as prescribed and return to the emergency department for pain, shortness of breath, fainting, or any other symptoms concerning to you.  Today you are leaving against medical advice.  The cause of your chest pain is unclear, and it may be due to blockages in the arteries of your heart.  This may lead to heart attack, worsening heart function, and death.

## 2016-07-12 NOTE — ED Provider Notes (Addendum)
[facility] Emergency Department Provider Note  ____________________________________________  Time seen: Approximately 1:38 PM  I have reviewed the triage vital signs and the nursing notes.   HISTORY  Chief Complaint Chest Pain    HPI Dennis Church is a 61 y.o. male with a history of CAD status post stent, HTN, HL presenting with chest pain. The patient has a long history of several years of atypical chest pain. Over the last week, his pain has worsened and changed in character. He reports that with exertion, he has been developing a "sore" sensation in the left side of the chest wall that is reproducible if he pushes on it, associated with shortness of breath, and left upper arm pain, nausea without vomiting. The pain is worse with deep breaths. No diaphoresis, lightheadedness or syncope. This pain improves if he rests. He notices no difference with aspirin, nitroglycerin use, or isosorbide.    The patient underwent cardiac catheterization in 04/2015 with the following results:  Prox RCA lesion, 100% stenosed.  Mid Cx lesion, 40% stenosed.  Mid LAD to Dist LAD lesion, 10% stenosed. The lesion was previously treated with a stent (unknown type).  Dist LAD lesion, 70% stenosed.  The left ventricular systolic function is normal.   Past Medical History:  Diagnosis Date  . Anginal pain (HCC)   . Coronary artery disease   . Hyperlipidemia   . Hypertension   . Myocardial infarction     Patient Active Problem List   Diagnosis Date Noted  . CAD in native artery 06/26/2014  . Combined fat and carbohydrate induced hyperlipemia 06/26/2014    Past Surgical History:  Procedure Laterality Date  . CARDIAC CATHETERIZATION Left 04/25/2015   Procedure: Left Heart Cath and Coronary Angiography;  Surgeon: Dalia Heading, MD;  Location: [facility] INVASIVE CV LAB;  Service: Cardiovascular;  Laterality: Left;  . CAROTID ENDARTERECTOMY Left   . CORONARY ANGIOPLASTY WITH  STENT PLACEMENT    . LITHOTRIPSY      Current Outpatient Rx  . Order #: [id number] Class: Historical Med  . Order #: [id number] Class: Historical Med  . Order #: [id number] Class: Historical Med  . Order #: [id number] Class: Historical Med  . Order #: [id number] Class: Historical Med  . Order #: [id number] Class: Historical Med    Allergies Patient has no known allergies.  History reviewed. No pertinent family history.  Social History Social History  Substance Use Topics  . Smoking status: Former Games developer  . Smokeless tobacco: Not on file  . Alcohol use No    Review of Systems Constitutional: No fever/chills. Eyes: No visual changes. ENT: No sore throat. No congestion or rhinorrhea. Cardiovascular: Positive chest pain. Denies palpitations. Respiratory: Positive shortness of breath.  No cough. Gastrointestinal: No abdominal pain.  No nausea, no vomiting.  No diarrhea.  No constipation. Genitourinary: Negative for dysuria. Musculoskeletal: Negative for back pain. Positive left upper extremity pain. Skin: Negative for rash. Neurological: Negative for headaches. No focal numbness, tingling or weakness.   10-point ROS otherwise negative.  ____________________________________________   PHYSICAL EXAM:  VITAL SIGNS: ED Triage Vitals  Enc Vitals Group     BP 07/12/16 1009 (!) 174/93     Pulse Rate 07/12/16 1009 67     Resp 07/12/16 1009 18     Temp 07/12/16 1009 98.6 F (37 C)     Temp Source 07/12/16 1009 Oral     SpO2 07/12/16 1009 96 %     Weight 07/12/16 1006 173 lb (78.5 kg)  Height 07/12/16 1006  (1.778 m)     Head Circumference --      Peak Flow --      Pain Score --      Pain Loc --      Pain Edu? --      Excl. in GC? --     Constitutional: Alert and oriented. Well appearing and in no acute distress. Answers questions appropriately. Eyes: Conjunctivae are normal.  EOMI. No scleral icterus. Head: Atraumatic. Nose: No congestion/rhinnorhea. Mouth/Throat:  Mucous membranes are moist.  Neck: No stridor.  Supple.  No JVD. Cardiovascular: Normal rate, regular rhythm. No murmurs, rubs or gallops.  Respiratory: Normal respiratory effort.  No accessory muscle use or retractions. Lungs CTAB.  No wheezes, rales or ronchi. Gastrointestinal: Soft, nontender and nondistended.  No guarding or rebound.  No peritoneal signs. Musculoskeletal: No LE edema. No ttp in the calves or palpable cords.  Negative Homan's sign. Neurologic:  A&Ox3.  Speech is clear.  Face and smile are symmetric.  EOMI.  Moves all extremities well. Skin:  Skin is warm, dry and intact. No rash noted. Psychiatric: Mood and affect are normal. Speech and behavior are normal.  Normal judgement.  ____________________________________________   LABS (all labs ordered are listed, but only abnormal results are displayed)  Labs Reviewed  BASIC METABOLIC PANEL - Abnormal; Notable for the following:       Result Value   Sodium 134 (*)    Glucose, Bld 110 (*)    Creatinine, Ser 1.27 (*)    GFR calc non Af Amer 58 (*)    All other components within normal limits  CBC - Abnormal; Notable for the following:    RDW 14.7 (*)    All other components within normal limits  TROPONIN I  TROPONIN I   ____________________________________________  EKG  ED ECG REPORT I, Rockne Menghini, the attending physician, personally viewed and interpreted this ECG.   Date: 07/12/2016  EKG Time: 1008  Rate: 66  Rhythm: normal sinus rhythm  Axis: normal  Intervals:none  ST&T Change: 1mm ST elevation isolated to V3 w/o reciprocal changes.  No STEMI.  Morphology is grossly unchanged when compared to 04/2015 prior exam.  ED ECG REPORT I, Rockne Menghini, the attending physician, personally viewed and interpreted this ECG.   Date: 07/12/2016  EKG Time: 1426  Rate: 59  Rhythm: sinus bradycardia  Axis: normal  Intervals:none  ST&T Change: no  STEMI  ____________________________________________  RADIOLOGY  Dg Chest 2 View  Result Date: 07/12/2016 CLINICAL DATA:  Chest pain.  Dyspnea EXAM: CHEST  2 VIEW COMPARISON:  04/07/2015 chest radiograph. FINDINGS: Stable cardiomediastinal silhouette with normal heart size. No pneumothorax. No pleural effusion. Lungs appear clear, with no acute consolidative airspace disease and no pulmonary edema. IMPRESSION: No active cardiopulmonary disease. Electronically Signed   By: Delbert Phenix M.D.   On: 07/12/2016 10:30   Ct Angio Chest Pe W And/or Wo Contrast  Result Date: 07/12/2016 CLINICAL DATA:  Intermittent chest pain, shortness of breath EXAM: CT ANGIOGRAPHY CHEST WITH CONTRAST TECHNIQUE: Multidetector CT imaging of the chest was performed using the standard protocol during bolus administration of intravenous contrast. Multiplanar CT image reconstructions and MIPs were obtained to evaluate the vascular anatomy. CONTRAST:  Seven 5 mL Isovue 370 COMPARISON:  None. FINDINGS: Cardiovascular: Satisfactory opacification of the pulmonary arteries to the segmental level. No evidence of pulmonary embolism. Normal heart size. No pericardial effusion. Mediastinum/Nodes: No enlarged mediastinal, hilar, or axillary lymph nodes. Thyroid  gland, trachea, and esophagus demonstrate no significant findings. Lungs/Pleura: Lungs are clear. No pleural effusion or pneumothorax. Upper Abdomen: No acute abnormality. Musculoskeletal: No chest wall abnormality. No acute or significant osseous findings. Review of the MIP images confirms the above findings. IMPRESSION: 1. No evidence of pulmonary embolus. Electronically Signed   By: Elige Ko   On: 07/12/2016 14:23    ____________________________________________   PROCEDURES  Procedure(s) performed: None  Procedures  Critical Care performed: No ____________________________________________   INITIAL IMPRESSION / ASSESSMENT AND PLAN / ED COURSE  Pertinent labs &  imaging results that were available during my care of the patient were reviewed by me and considered in my medical decision making (see chart for details).  61 y.o. w/ CAD sp stent, HTN, HL is ending with exertional chest pain radiating to the left upper extremity and associated with shortness of breath. Overall, I am concerned about ACS or MI. Other etiologies including musculoskeletal pain, gastrointestinal causes, or intermittent arrhythmias are also possible.  ----------------------------------------- 3:17 PM on 07/12/2016 -----------------------------------------  At this time, the patient is refusing to stay for further evaluation and treatment. His workup in the emergency department is reassuring with 2 EKGs that do not show ischemic changes and 2 troponins that are negative. However, I'm concerned about his patient with multiple high risk cardiac risk factors and experiencing exertional chest pain. If his symptoms are related to ACS or MI, he understands that if he goes home without further evaluation, he may experience worsening of his condition, lifelong comorbidities associated with ischemic disease, or even death. I talked to him multiple times throughout his emergency department stay, and he is adamant to go home. I spoke with Dr. Darrold Junker, the cardiologist on-call for his primary cardiologist, Dr. Lady Gary, for outpatient follow-up.  I discussed return precautions and follow up instructions with this patient.  ____________________________________________  FINAL CLINICAL IMPRESSION(S) / ED DIAGNOSES  Final diagnoses:  Shortness of breath  Exertional chest pain  Left arm pain  Sinus bradycardia         NEW MEDICATIONS STARTED DURING THIS VISIT:  New Prescriptions   No medications on file      Rockne Menghini, MD 07/12/16 1519    Rockne Menghini, MD 07/12/16 1520

## 2016-07-12 NOTE — ED Triage Notes (Signed)
Pt states intermittent chest pain and SOB for several weeks on and off, states hx of cardiac stent, states soreness in nature, awake and alert in no acute distress

## 2020-06-30 ENCOUNTER — Encounter: Payer: Self-pay | Admitting: Emergency Medicine

## 2020-06-30 ENCOUNTER — Emergency Department
Admission: EM | Admit: 2020-06-30 | Discharge: 2020-06-30 | Disposition: A | Discharge status: Home / Self Care | Payer: Medicare PPO | Attending: Emergency Medicine | Admitting: Emergency Medicine

## 2020-06-30 ENCOUNTER — Emergency Department: Payer: Medicare PPO

## 2020-06-30 ENCOUNTER — Other Ambulatory Visit: Payer: Self-pay

## 2020-06-30 DIAGNOSIS — Z87891 Personal history of nicotine dependence: Secondary | ICD-10-CM | POA: Diagnosis not present

## 2020-06-30 DIAGNOSIS — I10 Essential (primary) hypertension: Secondary | ICD-10-CM | POA: Insufficient documentation

## 2020-06-30 DIAGNOSIS — I714 Abdominal aortic aneurysm, without rupture, unspecified: Secondary | ICD-10-CM

## 2020-06-30 DIAGNOSIS — I2510 Atherosclerotic heart disease of native coronary artery without angina pectoris: Secondary | ICD-10-CM | POA: Diagnosis not present

## 2020-06-30 DIAGNOSIS — Z79899 Other long term (current) drug therapy: Secondary | ICD-10-CM | POA: Insufficient documentation

## 2020-06-30 DIAGNOSIS — Z7982 Long term (current) use of aspirin: Secondary | ICD-10-CM | POA: Insufficient documentation

## 2020-06-30 DIAGNOSIS — R109 Unspecified abdominal pain: Secondary | ICD-10-CM

## 2020-06-30 DIAGNOSIS — Z955 Presence of coronary angioplasty implant and graft: Secondary | ICD-10-CM | POA: Insufficient documentation

## 2020-06-30 DIAGNOSIS — Z7902 Long term (current) use of antithrombotics/antiplatelets: Secondary | ICD-10-CM | POA: Diagnosis not present

## 2020-06-30 DIAGNOSIS — N210 Calculus in bladder: Secondary | ICD-10-CM | POA: Diagnosis not present

## 2020-06-30 DIAGNOSIS — M545 Low back pain, unspecified: Secondary | ICD-10-CM

## 2020-06-30 HISTORY — DX: Disorder of kidney and ureter, unspecified: N28.9

## 2020-06-30 LAB — COMPREHENSIVE METABOLIC PANEL
ALT: 17 U/L (ref 0–44)
AST: 19 U/L (ref 15–41)
Albumin: 4.4 g/dL (ref 3.5–5.0)
Alkaline Phosphatase: 96 U/L (ref 38–126)
Anion gap: 10 (ref 5–15)
BUN: 19 mg/dL (ref 8–23)
CO2: 23 mmol/L (ref 22–32)
Calcium: 9.9 mg/dL (ref 8.9–10.3)
Chloride: 100 mmol/L (ref 98–111)
Creatinine, Ser: 1.28 mg/dL — ABNORMAL HIGH (ref 0.61–1.24)
GFR, Estimated: >60 mL/min (ref >60)
Glucose, Bld: 108 mg/dL — ABNORMAL HIGH (ref 70–99)
Potassium: 4.2 mmol/L (ref 3.5–5.1)
Sodium: 133 mmol/L — ABNORMAL LOW (ref 135–145)
Total Bilirubin: 1.1 mg/dL (ref 0.3–1.2)
Total Protein: 8.5 g/dL — ABNORMAL HIGH (ref 6.5–8.1)

## 2020-06-30 LAB — CBC
HCT: 50.3 % (ref 39.0–52.0)
Hemoglobin: 17.4 g/dL — ABNORMAL HIGH (ref 13.0–17.0)
MCH: 30.8 pg (ref 26.0–34.0)
MCHC: 34.6 g/dL (ref 30.0–36.0)
MCV: 89 fL (ref 80.0–100.0)
Platelets: 210 10*3/uL (ref 150–400)
RBC: 5.65 MIL/uL (ref 4.22–5.81)
RDW: 14.3 % (ref 11.5–15.5)
WBC: 9.1 10*3/uL (ref 4.0–10.5)
nRBC: 0 % (ref 0.0–0.2)

## 2020-06-30 LAB — URINALYSIS, COMPLETE (UACMP) WITH MICROSCOPIC
Bilirubin Urine: NEGATIVE
Glucose, UA: NEGATIVE mg/dL
Ketones, ur: NEGATIVE mg/dL
Nitrite: NEGATIVE
Protein, ur: 100 mg/dL — AB
Specific Gravity, Urine: 1.006 (ref 1.005–1.030)
pH: 6 (ref 5.0–8.0)

## 2020-06-30 LAB — LIPASE, BLOOD: Lipase: 43 U/L (ref 11–51)

## 2020-06-30 MED ORDER — AMLODIPINE BESYLATE 5 MG PO TABS
5.0000 mg | ORAL_TABLET | Freq: Once | ORAL | Status: AC
  Administered 2020-06-30: 5 mg via ORAL
  Filled 2020-06-30: qty 1

## 2020-06-30 MED ORDER — LIDOCAINE 5 % EX PTCH: 1.0000 | MEDICATED_PATCH | Freq: Two times a day (BID) | CUTANEOUS | 0 refills | Status: AC

## 2020-06-30 MED ORDER — AMLODIPINE BESYLATE 5 MG PO TABS: 5.0000 mg | ORAL_TABLET | Freq: Every day | ORAL | 1 refills | Status: DC

## 2020-06-30 MED ORDER — CYCLOBENZAPRINE HCL 5 MG PO TABS: 5.0000 mg | ORAL_TABLET | Freq: Three times a day (TID) | ORAL | 0 refills | Status: DC | PRN

## 2020-06-30 MED ORDER — ONDANSETRON HCL 4 MG/2ML IJ SOLN
4.0000 mg | Freq: Once | INTRAMUSCULAR | Status: AC
  Administered 2020-06-30: 4 mg via INTRAVENOUS
  Filled 2020-06-30: qty 2

## 2020-06-30 MED ORDER — MORPHINE SULFATE (PF) 4 MG/ML IV SOLN
4.0000 mg | Freq: Once | INTRAVENOUS | Status: DC
  Filled 2020-06-30: qty 1

## 2020-06-30 MED ORDER — TAMSULOSIN HCL 0.4 MG PO CAPS: 0.4000 mg | ORAL_CAPSULE | Freq: Every day | ORAL | 1 refills | Status: DC

## 2020-06-30 MED ORDER — METOPROLOL TARTRATE 25 MG PO TABS
25.0000 mg | ORAL_TABLET | Freq: Once | ORAL | Status: AC
  Administered 2020-06-30: 25 mg via ORAL
  Filled 2020-06-30: qty 1

## 2020-06-30 MED ORDER — LACTATED RINGERS IV BOLUS
1000.0000 mL | Freq: Once | INTRAVENOUS | Status: AC
Start: 2020-06-30 — End: 2020-06-30
  Administered 2020-06-30: 1000 mL via INTRAVENOUS

## 2020-06-30 NOTE — ED Provider Notes (Addendum)
[facility] Emergency Department Provider Note   ____________________________________________   Event Date/Time   First MD Initiated Contact with Patient 06/30/20 1251     (approximate)  I have reviewed the triage vital signs and the nursing notes.   HISTORY  Chief Complaint Flank Pain    HPI Dennis Church is a 65 y.o. male with past medical history of hypertension, hyperlipidemia, CAD, and kidney stones who presents to the ED complaining of flank pain.  Patient reports that he has been dealing with intermittent pain in his left flank radiating towards his lower back for the past 2 to 3 days.  Pain has since become severe and constant, limiting his movement.  He denies any fevers, nausea, or vomiting, but does endorse some burning when he urinates.  He noticed some blood in his urine last week when he believes he passed a different kidney stone.  He describes current symptoms as similar to prior kidney stones.        Past Medical History:  Diagnosis Date  . Anginal pain (HCC)   . Coronary artery disease   . Hyperlipidemia   . Hypertension   . Myocardial infarction (HCC)   . Renal disorder    kidney stones    Patient Active Problem List   Diagnosis Date Noted  . CAD in native artery 06/26/2014  . Combined fat and carbohydrate induced hyperlipemia 06/26/2014    Past Surgical History:  Procedure Laterality Date  . CARDIAC CATHETERIZATION Left 04/25/2015   Procedure: Left Heart Cath and Coronary Angiography;  Surgeon: Dalia Heading, MD;  Location: [facility] INVASIVE CV LAB;  Service: Cardiovascular;  Laterality: Left;  . CAROTID ENDARTERECTOMY Left   . CORONARY ANGIOPLASTY WITH STENT PLACEMENT    . LITHOTRIPSY      Prior to Admission medications   Medication Sig Start Date End Date Taking? Authorizing Provider  cyclobenzaprine (FLEXERIL) 5 MG tablet Take 1 tablet (5 mg total) by mouth 3 (three) times daily as needed for muscle spasms. 06/30/20  Yes  Chesley Noon, MD  lidocaine (LIDODERM) 5 % Place 1 patch onto the skin every 12 (twelve) hours. Remove & Discard patch within 12 hours or as directed by MD 06/30/20 06/30/21 Yes Chesley Noon, MD  tamsulosin (FLOMAX) 0.4 MG CAPS capsule Take 1 capsule (0.4 mg total) by mouth daily after supper. 06/30/20  Yes Chesley Noon, MD  aspirin 81 MG tablet Take 81 mg by mouth daily.    [provider]  atorvastatin (LIPITOR) 80 MG tablet Take 80 mg by mouth every evening.    [provider]  clopidogrel (PLAVIX) 75 MG tablet Take 75 mg by mouth daily.    [provider]  isosorbide mononitrate (IMDUR) 60 MG 24 hr tablet Take 60 mg by mouth daily.    [provider]  metoprolol tartrate (LOPRESSOR) 25 MG tablet Take 25 mg by mouth 2 (two) times daily.    [provider]  nitroGLYCERIN (NITROSTAT) 0.4 MG SL tablet Place 0.4 mg under the tongue every 5 (five) minutes x 3 doses as needed.    [provider]    Allergies Patient has no known allergies.  No family history on file.  Social History Social History   Tobacco Use  . Smoking status: Former Games developer  . Smokeless tobacco: Never Used  Substance Use Topics  . Alcohol use: No    Review of Systems  Constitutional: No fever/chills Eyes: No visual changes. ENT: No sore throat. Cardiovascular:  Denies chest pain. Respiratory: Denies shortness of breath. Gastrointestinal: Positive for flank pain.  No abdominal pain.  No nausea, no vomiting.  No diarrhea.  No constipation. Genitourinary: Positive for dysuria. Musculoskeletal: Negative for back pain. Skin: Negative for rash. Neurological: Negative for headaches, focal weakness or numbness.  ____________________________________________   PHYSICAL EXAM:  VITAL SIGNS: ED Triage Vitals  Enc Vitals Group     BP 06/30/20 1223 (!) 237/99     Pulse Rate 06/30/20 1223 66     Resp 06/30/20 1223 16     Temp --      Temp src --      SpO2  06/30/20 1223 98 %     Weight 06/30/20 1220 173 lb 1 oz (78.5 kg)     Height 06/30/20 1220 5\' 10"  (1.778 m)     Head Circumference --      Peak Flow --      Pain Score 06/30/20 1219 2     Pain Loc --      Pain Edu? --      Excl. in GC? --     Constitutional: Alert and oriented. Eyes: Conjunctivae are normal. Head: Atraumatic. Nose: No congestion/rhinnorhea. Mouth/Throat: Mucous membranes are moist. Neck: Normal ROM Cardiovascular: Normal rate, regular rhythm. Grossly normal heart sounds. Respiratory: Normal respiratory effort.  No retractions. Lungs CTAB. Gastrointestinal: Soft and nontender.  Left CVA tenderness noted.  No distention. Genitourinary: deferred Musculoskeletal: No lower extremity tenderness nor edema. Neurologic:  Normal speech and language. No gross focal neurologic deficits are appreciated. Skin:  Skin is warm, dry and intact. No rash noted. Psychiatric: Mood and affect are normal. Speech and behavior are normal.  ____________________________________________   LABS (all labs ordered are listed, but only abnormal results are displayed)  Labs Reviewed  CBC - Abnormal; Notable for the following components:      Result Value   Hemoglobin 17.4 (*)    All other components within normal limits  COMPREHENSIVE METABOLIC PANEL - Abnormal; Notable for the following components:   Sodium 133 (*)    Glucose, Bld 108 (*)    Creatinine, Ser 1.28 (*)    Total Protein 8.5 (*)    All other components within normal limits  URINALYSIS, COMPLETE (UACMP) WITH MICROSCOPIC - Abnormal; Notable for the following components:   Color, Urine YELLOW (*)    APPearance CLEAR (*)    Hgb urine dipstick SMALL (*)    Protein, ur 100 (*)    Leukocytes,Ua SMALL (*)    Bacteria, UA RARE (*)    All other components within normal limits  LIPASE, BLOOD    PROCEDURES  Procedure(s) performed (including Critical  Care):  Procedures   ____________________________________________   INITIAL IMPRESSION / ASSESSMENT AND PLAN / ED COURSE       65 year old male with past medical history of hypertension, hyperlipidemia, CAD, kidney stones who presents to the ED with 2 to 3 days of intermittent left flank pain that has since become constant.  Symptoms sound consistent with kidney stone and we will further assess with noncontrast CT scan.  We will also check labs including CBC, CMP, lipase, and UA.  We will treat symptomatically with morphine and Zofran, hydrate with IV fluids.  I would also consider pyelonephritis, lumbar strain, or lumbar radiculopathy given pain extends into his lower back.  Labs are unremarkable, UA does not appear consistent with infection.  CT scan shows 4.9 cm infrarenal abdominal aortic aneurysm.  This finding was discussed with Dr.  Schnier of vascular surgery, who agrees it is unlikely to be the source of patient's pain and states patient may follow-up in vascular surgery clinic for monitoring.  Additionally, patient found to have enlarged prostate with associated bladder stone, which we will treat with Flomax.  There is no evidence of obstructing kidney stone, patient's pain may be from stone that has since passed or musculoskeletal in etiology as he now primarily points to his bilateral lumbar area.  We will treat conservatively with Lidoderm patches and Flexeril, patient counseled to follow-up with his PCP, vascular surgery, and urology.  He was counseled to return to the ED for new worsening symptoms, patient agrees with plan.  Patient noted to be hypertensive at the time of triage, states all that he takes for blood pressure is metoprolol once daily but was recently told by his cardiologist he needs to start taking it twice daily.  We will give his second dose of metoprolol today and also add amlodipine to his regimen.  Patient continues to feel much better with minimal pain at this time  and remains appropriate for discharge home.      ____________________________________________   FINAL CLINICAL IMPRESSION(S) / ED DIAGNOSES  Final diagnoses:  Left flank pain  Acute bilateral low back pain without sciatica  Abdominal aortic aneurysm (AAA) without rupture (HCC)  Bladder stone     ED Discharge Orders         Ordered    lidocaine (LIDODERM) 5 %  Every 12 hours        06/30/20 1444    cyclobenzaprine (FLEXERIL) 5 MG tablet  3 times daily PRN        06/30/20 1444    tamsulosin (FLOMAX) 0.4 MG CAPS capsule  Daily after supper        06/30/20 1444           Note:  This document was prepared using Dragon voice recognition software and may include unintentional dictation errors.   Chesley Noon, MD 06/30/20 1446    Chesley Noon, MD 06/30/20 1500

## 2020-06-30 NOTE — ED Notes (Signed)
Per ed md ok for patient to leave with elevated bp

## 2020-06-30 NOTE — ED Triage Notes (Signed)
Left flank pain x 2-3 days.  States has history of kidney stones and passed a stone last week.

## 2021-08-08 ENCOUNTER — Encounter: Payer: Self-pay | Admitting: Emergency Medicine

## 2021-08-08 ENCOUNTER — Other Ambulatory Visit: Payer: Self-pay

## 2021-08-08 ENCOUNTER — Emergency Department
Admission: EM | Admit: 2021-08-08 | Discharge: 2021-08-08 | Disposition: A | Discharge status: Home / Self Care | Payer: Medicare PPO | Attending: Emergency Medicine | Admitting: Emergency Medicine

## 2021-08-08 DIAGNOSIS — R944 Abnormal results of kidney function studies: Secondary | ICD-10-CM | POA: Insufficient documentation

## 2021-08-08 DIAGNOSIS — N3000 Acute cystitis without hematuria: Secondary | ICD-10-CM | POA: Insufficient documentation

## 2021-08-08 DIAGNOSIS — R339 Retention of urine, unspecified: Secondary | ICD-10-CM

## 2021-08-08 DIAGNOSIS — I2510 Atherosclerotic heart disease of native coronary artery without angina pectoris: Secondary | ICD-10-CM | POA: Insufficient documentation

## 2021-08-08 DIAGNOSIS — I10 Essential (primary) hypertension: Secondary | ICD-10-CM | POA: Diagnosis not present

## 2021-08-08 DIAGNOSIS — F17200 Nicotine dependence, unspecified, uncomplicated: Secondary | ICD-10-CM | POA: Diagnosis not present

## 2021-08-08 DIAGNOSIS — Z720 Tobacco use: Secondary | ICD-10-CM

## 2021-08-08 LAB — CBC WITH DIFFERENTIAL/PLATELET
Abs Immature Granulocytes: 0.03 10*3/uL (ref 0.00–0.07)
Basophils Absolute: 0 10*3/uL (ref 0.0–0.1)
Basophils Relative: 0 %
Eosinophils Absolute: 0.1 10*3/uL (ref 0.0–0.5)
Eosinophils Relative: 1 %
HCT: 47.7 % (ref 39.0–52.0)
Hemoglobin: 15.9 g/dL (ref 13.0–17.0)
Immature Granulocytes: 0 %
Lymphocytes Relative: 19 %
Lymphs Abs: 1.7 10*3/uL (ref 0.7–4.0)
MCH: 29.5 pg (ref 26.0–34.0)
MCHC: 33.3 g/dL (ref 30.0–36.0)
MCV: 88.5 fL (ref 80.0–100.0)
Monocytes Absolute: 0.7 10*3/uL (ref 0.1–1.0)
Monocytes Relative: 8 %
Neutro Abs: 6.3 10*3/uL (ref 1.7–7.7)
Neutrophils Relative %: 72 %
Platelets: 158 10*3/uL (ref 150–400)
RBC: 5.39 MIL/uL (ref 4.22–5.81)
RDW: 14.1 % (ref 11.5–15.5)
WBC: 8.9 10*3/uL (ref 4.0–10.5)
nRBC: 0 % (ref 0.0–0.2)

## 2021-08-08 LAB — BASIC METABOLIC PANEL
Anion gap: 9 (ref 5–15)
BUN: 22 mg/dL (ref 8–23)
CO2: 23 mmol/L (ref 22–32)
Calcium: 9.9 mg/dL (ref 8.9–10.3)
Chloride: 102 mmol/L (ref 98–111)
Creatinine, Ser: 1.38 mg/dL — ABNORMAL HIGH (ref 0.61–1.24)
GFR, Estimated: 56 mL/min — ABNORMAL LOW (ref >60)
Glucose, Bld: 105 mg/dL — ABNORMAL HIGH (ref 70–99)
Potassium: 4 mmol/L (ref 3.5–5.1)
Sodium: 134 mmol/L — ABNORMAL LOW (ref 135–145)

## 2021-08-08 LAB — URINALYSIS, ROUTINE W REFLEX MICROSCOPIC
Bilirubin Urine: NEGATIVE
Glucose, UA: NEGATIVE mg/dL
Ketones, ur: NEGATIVE mg/dL
Nitrite: NEGATIVE
Protein, ur: 100 mg/dL — AB
RBC / HPF: >50 RBC/hpf — ABNORMAL HIGH (ref 0–5)
Specific Gravity, Urine: 1.012 (ref 1.005–1.030)
WBC, UA: >50 WBC/hpf — ABNORMAL HIGH (ref 0–5)
pH: 5 (ref 5.0–8.0)

## 2021-08-08 MED ORDER — ISOSORBIDE MONONITRATE ER 60 MG PO TB24
60.0000 mg | ORAL_TABLET | Freq: Every day | ORAL | Status: DC
  Administered 2021-08-08: 60 mg via ORAL
  Filled 2021-08-08: qty 1

## 2021-08-08 MED ORDER — TAMSULOSIN HCL 0.4 MG PO CAPS
0.4000 mg | ORAL_CAPSULE | Freq: Every day | ORAL | 0 refills | Status: AC
Start: 2021-08-08 — End: 2021-08-15

## 2021-08-08 MED ORDER — SULFAMETHOXAZOLE-TRIMETHOPRIM 800-160 MG PO TABS
1.0000 | ORAL_TABLET | Freq: Two times a day (BID) | ORAL | 0 refills | Status: AC
Start: 2021-08-08 — End: 2021-08-18

## 2021-08-08 MED ORDER — ACETAMINOPHEN 500 MG PO TABS
1000.0000 mg | ORAL_TABLET | Freq: Once | ORAL | Status: AC
  Administered 2021-08-08: 1000 mg via ORAL
  Filled 2021-08-08: qty 2

## 2021-08-08 MED ORDER — METOPROLOL TARTRATE 25 MG PO TABS
25.0000 mg | ORAL_TABLET | Freq: Two times a day (BID) | ORAL | Status: DC
  Administered 2021-08-08: 25 mg via ORAL
  Filled 2021-08-08: qty 1

## 2021-08-08 MED ORDER — SODIUM CHLORIDE 0.9 % IV SOLN
1.0000 g | Freq: Once | INTRAVENOUS | Status: AC
  Administered 2021-08-08: 1 g via INTRAVENOUS
  Filled 2021-08-08: qty 10

## 2021-08-08 NOTE — ED Triage Notes (Signed)
Pt reports for the past 4 days he has not been able to void reports he only has been able to release a couple of drops, feels burning sensation. Pt reports feeling sore in his suprapubic area. Pt reports he has high blood pressure reports taking his meds today. Denies any other symptom at present  ?

## 2021-08-08 NOTE — ED Provider Notes (Addendum)
? ?[facility] ?Provider Note ? ? ? Event Date/Time  ? First MD Initiated Contact with Patient 08/08/21 [phone]   ?  (approximate) ? ? ?History  ? ?Dysuria and Urinary Retention ? ? ?HPI ? ?Dennis Church is a 66 y.o. male with a past medical history of CAD, HTN, HDL and BPH as well as tobacco abuse who presents for evaluation of 3 to 4 days of difficulty urinating associate with some left-sided flank pain and suprapubic discomfort.  Patient states whenever he lies on his left side he will feel like he needs to urinate but has been having significant difficulty initiating stream has hardly had any urine output at all of last couple days.  He has not had any chest pain, cough, shortness of breath, headache, earache, sore throat, vomiting, diarrhea, constipation or actual burning when he is peeing or any blood.  He has had prior episodes of retention but is not sure when this was.  States he has had a procedure in the past to reduce prostate side but this was at the [place] and he is not sure what exactly was done or when.  He states that other than not taking his blood pressure medicines today he is compliant with his medications. ? ?  ? ? ?Physical Exam  ?Triage Vital Signs: ?ED Triage Vitals  ?Enc Vitals Group  ?   BP 08/08/21 0203 (!) 204/118  ?   Pulse Rate 08/08/21 0203 91  ?   Resp 08/08/21 0203 16  ?   Temp 08/08/21 0203 (!) 97.5 ?F (36.4 ?C)  ?   Temp Source 08/08/21 0203 Oral  ?   SpO2 08/08/21 0203 99 %  ?   Weight 08/08/21 0204 160 lb (72.6 kg)  ?   Height 08/08/21 0204 5\' 10"  (1.778 m)  ?   Head Circumference --   ?   Peak Flow --   ?   Pain Score 08/08/21 0203 10  ?   Pain Loc --   ?   Pain Edu? --   ?   Excl. in GC? --   ? ? ?Most recent vital signs: ?Vitals:  ? 08/08/21 0203 08/08/21 0428  ?BP: (!) 204/118 (!) 207/122  ?Pulse: 91 77  ?Resp: 16 16  ?Temp: (!) 97.5 ?F (36.4 ?C) 97.7 ?F (36.5 ?C)  ?SpO2: 99% 97%  ? ? ?General: Awake, no distress.  ?CV:  Good peripheral perfusion.  2+ radial  pulses. ?Resp:  Normal effort.  Clear bilaterally. ?Abd:  No distention.  Some mild tenderness in the suprapubic region.  No tenderness in the upper quadrants or CVA tenderness. ?Other:   ? ? ?ED Results / Procedures / Treatments  ?Labs ?(all labs ordered are listed, but only abnormal results are displayed) ?Labs Reviewed  ?URINALYSIS, ROUTINE W REFLEX MICROSCOPIC - Abnormal; Notable for the following components:  ?    Result Value  ? Color, Urine YELLOW (*)   ? APPearance CLOUDY (*)   ? Hgb urine dipstick LARGE (*)   ? Protein, ur 100 (*)   ? Leukocytes,Ua LARGE (*)   ? RBC / HPF >50 (*)   ? WBC, UA >50 (*)   ? Bacteria, UA RARE (*)   ? All other components within normal limits  ?BASIC METABOLIC PANEL - Abnormal; Notable for the following components:  ? Sodium 134 (*)   ? Glucose, Bld 105 (*)   ? Creatinine, Ser 1.38 (*)   ? GFR, Estimated 56 (*)   ?  All other components within normal limits  ?URINE CULTURE  ?CBC WITH DIFFERENTIAL/PLATELET  ? ? ? ?EKG ? ? ? ?RADIOLOGY ? ? ? ?PROCEDURES: ? ?Critical Care performed: No ? ?Procedures ? ? ? ?MEDICATIONS ORDERED IN ED: ?Medications  ?isosorbide mononitrate (IMDUR) 24 hr tablet 60 mg (has no administration in time range)  ?metoprolol tartrate (LOPRESSOR) tablet 25 mg (has no administration in time range)  ?cefTRIAXone (ROCEPHIN) 1 g in sodium chloride 0.9 % 100 mL IVPB (has no administration in time range)  ?acetaminophen (TYLENOL) tablet 1,000 mg (has no administration in time range)  ? ? ? ?IMPRESSION / MDM / ASSESSMENT AND PLAN / ED COURSE  ?I reviewed the triage vital signs and the nursing notes. ?             ?               ? ?Differential diagnosis includes, but is not limited to urinary hesitancy and some suprapubic pressure related to acute urinary retention versus cystitis.  Patient is also noted to be quite hypertensive in triage which could be related to pain from this issue with the patient not having taken his morning blood pressure medications.  He does  not seem otherwise symptomatic from this as he has no chest pain or headache or lightheadedness or dizziness or visual changes.  He states he has struggled with high blood pressures with systolics approaching the 200s for years. ? ?We will order home dose of blood pressure medications obtain a postvoid residual bladder scan.  Patient was able to provide a small urine sample that was sent for analysis while in triage which showed findings concerning for cystitis with large leukocyte esterase as well as greater than 50 WBCs and some bacteria.  There is also a large hemoglobin at 100 protein.  Urine culture sent. ? ?BMP shows a creatinine of 1.38 compared to 1.281-year ago without any other significant lecture metabolic derangements.  CBC is unremarkable. ? ?Postvoid residual bladder scan showed greater than 1 L.  Foley placed with nearly 1 L of output. ? ?Patient given dose Rocephin while undergoing initial work-up.  We will treat cystitis with a course of Bactrim.  Also prescribed Flomax.  Instructed to follow-up with urology for his retention requiring Foley placement emergency room.  He also follow-up with PCP to have his blood pressure rechecked.  He has no other acute concerns at this time.  Counseled on tobacco cessation.  Discharged in stable condition.  Strict return precautions advised and discussed. ? ? ? ?  ? ? ?FINAL CLINICAL IMPRESSION(S) / ED DIAGNOSES  ? ?Final diagnoses:  ?Urinary retention  ?Acute cystitis without hematuria  ?Hypertension, unspecified type  ?Tobacco abuse  ? ? ? ?Rx / DC Orders  ? ?ED Discharge Orders   ? ?      Ordered  ?  sulfamethoxazole-trimethoprim (BACTRIM DS) 800-160 MG tablet  2 times daily       ? 08/08/21 0856  ?  tamsulosin (FLOMAX) 0.4 MG CAPS capsule  Daily       ? 08/08/21 0856  ? ?  ?  ? ?  ? ? ? ?Note:  This document was prepared using Dragon voice recognition software and may include unintentional dictation errors. ?  ?Gilles Chiquito, MD ?08/08/21 [phone] ? ?  ?Gilles Chiquito, MD ?08/08/21 [phone] ? ?

## 2021-08-08 NOTE — ED Notes (Signed)
Bladder scanner > 1014 ml ?

## 2021-08-08 NOTE — ED Notes (Signed)
Pt tolerated well bladder scanned, retaining 241ml of urine  ?

## 2021-08-09 LAB — URINE CULTURE: Culture: NO GROWTH

## 2021-08-19 ENCOUNTER — Ambulatory Visit: Payer: Self-pay | Admitting: Physician Assistant

## 2021-08-19 ENCOUNTER — Encounter: Payer: Self-pay | Admitting: Urology

## 2021-08-19 ENCOUNTER — Ambulatory Visit (INDEPENDENT_AMBULATORY_CARE_PROVIDER_SITE_OTHER): Payer: Medicare PPO | Admitting: Urology

## 2021-08-19 VITALS — BP 189/109 | HR 89 | Ht 70.0 in | Wt 175.0 lb

## 2021-08-19 DIAGNOSIS — N210 Calculus in bladder: Secondary | ICD-10-CM

## 2021-08-19 DIAGNOSIS — R3129 Other microscopic hematuria: Secondary | ICD-10-CM | POA: Diagnosis not present

## 2021-08-19 DIAGNOSIS — N401 Enlarged prostate with lower urinary tract symptoms: Secondary | ICD-10-CM | POA: Diagnosis not present

## 2021-08-19 DIAGNOSIS — R338 Other retention of urine: Secondary | ICD-10-CM

## 2021-08-19 DIAGNOSIS — N200 Calculus of kidney: Secondary | ICD-10-CM

## 2021-08-19 DIAGNOSIS — R972 Elevated prostate specific antigen [PSA]: Secondary | ICD-10-CM

## 2021-08-19 DIAGNOSIS — N402 Nodular prostate without lower urinary tract symptoms: Secondary | ICD-10-CM

## 2021-08-19 LAB — URINALYSIS, COMPLETE
Bilirubin, UA: NEGATIVE
Glucose, UA: NEGATIVE
Ketones, UA: NEGATIVE
Leukocytes,UA: NEGATIVE
Nitrite, UA: NEGATIVE
Specific Gravity, UA: 1.015 (ref 1.005–1.030)
Urobilinogen, Ur: 0.2 mg/dL (ref 0.2–1.0)
pH, UA: 6 (ref 5.0–7.5)

## 2021-08-19 LAB — MICROSCOPIC EXAMINATION: Bacteria, UA: NONE SEEN

## 2021-08-19 LAB — BLADDER SCAN AMB NON-IMAGING: Scan Result: 811

## 2021-08-19 MED ORDER — TAMSULOSIN HCL 0.4 MG PO CAPS: 0.4000 mg | ORAL_CAPSULE | Freq: Every day | ORAL | 1 refills | Status: DC

## 2021-08-19 NOTE — Progress Notes (Signed)
? ?08/19/2021 ?11:46 AM  ? ?Dennis Church ?08/20/1955 ?[id number] ? ?Referring provider: No referring provider defined for this encounter. ? ?Chief Complaint  ?Patient presents with  ? Urinary Retention  ? ? ?HPI: ?Dennis Church is a 66 y.o. male who presents for evaluation of urinary retention. ? ?[facility] ED visit 08/08/2021 for dysuria and urinary retention ?UA with >50 RBC/WBC; creatinine 1.38 ?PVR >1 L and Foley catheter placed ?Given Rx tamsulosin and Bactrim with instructions of urology follow-up. ?States the catheter fell out after 3 days ?Was only given a 7-day course of tamsulosin ?Urine culture was negative ?CT March 2022 performed for flank pain showed a 1.7 cm bladder calculus, small renal calculi and prostate enlargement with estimated volume 82 cc ?Prior history stone disease treated with lithotripsy ?Voiding symptoms include frequency, urgency, hesitancy and a weak urinary stream ?Occasional dysuria ? ? ?PMH: ?Past Medical History:  ?Diagnosis Date  ? Anginal pain ([place])   ? Coronary artery disease   ? Hyperlipidemia   ? Hypertension   ? Myocardial infarction [facility])   ? Renal disorder   ? kidney stones  ? ? ?Surgical History: ?Past Surgical History:  ?Procedure Laterality Date  ? CARDIAC CATHETERIZATION Left 04/25/2015  ? Procedure: Left Heart Cath and Coronary Angiography;  Surgeon: Teodoro Spray, MD;  Location: [place] CV LAB;  Service: Cardiovascular;  Laterality: Left;  ? CAROTID ENDARTERECTOMY Left   ? CORONARY ANGIOPLASTY WITH STENT PLACEMENT    ? LITHOTRIPSY    ? ? ?Home Medications:  ?Allergies as of 08/19/2021   ?No Known Allergies ?  ? ?  ?Medication List  ?  ? ?  ? Accurate as of Aug 19, 2021 11:46 AM. If you have any questions, ask your nurse or doctor.  ?  ?  ? ?  ? ?amLODipine 5 MG tablet ?Commonly known as: NORVASC ?Take 1 tablet (5 mg total) by mouth daily. ?  ?aspirin 81 MG tablet ?Take 81 mg by mouth daily. ?  ?atorvastatin 80 MG tablet ?Commonly known as: LIPITOR ?Take 80 mg by mouth  every evening. ?  ?clopidogrel 75 MG tablet ?Commonly known as: PLAVIX ?Take 75 mg by mouth daily. ?  ?isosorbide mononitrate 60 MG 24 hr tablet ?Commonly known as: IMDUR ?Take 60 mg by mouth daily. ?  ?metoprolol tartrate 25 MG tablet ?Commonly known as: LOPRESSOR ?Take 25 mg by mouth 2 (two) times daily. ?  ?nitroGLYCERIN 0.4 MG SL tablet ?Commonly known as: NITROSTAT ?Place 0.4 mg under the tongue every 5 (five) minutes x 3 doses as needed. ?  ?tamsulosin 0.4 MG Caps capsule ?Commonly known as: FLOMAX ?Take 1 capsule (0.4 mg total) by mouth daily. ?Started by: Abbie Sons, MD ?  ? ?  ? ? ?Allergies: No Known Allergies ? ?Family History: ?No family history on file. ? ?Social History:  reports that he has quit smoking. He has never used smokeless tobacco. He reports that he does not drink alcohol. No history on file for drug use. ? ? ?Physical Exam: ?BP (!) 189/109   Pulse 89   Ht 5\' 10"  (1.778 m)   Wt 175 lb (79.4 kg)   BMI 25.11 kg/m?   ?Constitutional:  Alert and oriented, No acute distress. ?HEENT: [place] AT, moist mucus membranes.  Trachea midline, no masses. ?Cardiovascular: No clubbing, cyanosis, or edema. ?Respiratory: Normal respiratory effort, no increased work of breathing. ?GI: Abdomen is soft, nontender, nondistended, no abdominal masses ?GU: Prostate 60+ cc, smooth with prominent nodular  area left mid prostate ?Skin: No rashes, bruises or suspicious lesions. ?Neurologic: Grossly intact, no focal deficits, moving all 4 extremities. ?Psychiatric: Normal mood and affect. ? ?Laboratory Data: ? ?Urinalysis ?Dipstick 2+ blood/1+ protein ?Microscopy 11-30 RBC/0-5 WBC ? ?Pertinent Imaging: ?Images were personally reviewed and interpreted ? ?CT Renal Stone Study ? ?Narrative ?CLINICAL DATA:  2-3 day history of left flank pain ? ?EXAM: ?CT ABDOMEN AND PELVIS WITHOUT CONTRAST ? ?TECHNIQUE: ?Multidetector CT imaging of the abdomen and pelvis was performed ?following the standard protocol without IV  contrast. ? ?COMPARISON:  CT abdomen/pelvis 05/09/2008 ? ?FINDINGS: ?Lower chest: No acute abnormality. ? ?Hepatobiliary: No focal liver abnormality is seen. No gallstones, ?gallbladder wall thickening, or biliary dilatation. ? ?Pancreas: Unremarkable. No pancreatic ductal dilatation or ?surrounding inflammatory changes. ? ?Spleen: Normal in size without focal abnormality. ? ?Adrenals/Urinary Tract: Approximately 1.7 x 1.1 x 1.1 cm bladder ?stone. Prostatomegaly. Punctate calcifications present in the renal ?hila bilaterally, greater on the right than the left. This is in the ?setting of multifocal calcifications within the renal arteries. ?Findings likely represent a combination of calcified atherosclerotic ?plaque and small kidney stones. The largest individual stone ?measures 3 mm in the upper pole of the right kidney. No evidence of ?hydronephrosis. Unremarkable ureters. ? 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 No evidence of obstruction or focal bowel wall ?thickening. ? ?Vascular/Lymphatic: Limited evaluation in the absence of intravenous ?contrast. Fusiform aneurysmal dilation of the infrarenal abdominal ?aorta with maximal diameters of 4.7 x 4.9 cm. Intraluminal ?calcification likely represents ossification is prominent wall ?adherent mural thrombus. No inflammatory changes or evidence of ?impending rupture. ? ?Reproductive: Marked prostatomegaly.The prostate gland measures ?approximately 5.7 x 5.3 x 5.2 cm (volume = 82 cm^3). ? ?Other: No abdominal wall hernia or abnormality. No abdominopelvic ?ascites. ? ?Musculoskeletal: No acute fracture or aggressive appearing lytic or ?blastic osseous lesion. ? ?IMPRESSION: ?1. Fusiform infrarenal abdominal aortic aneurysm with maximal ?diameter of 4.9 cm. Recommend follow-up every 6 months and vascular ?consultation. This recommendation follows ACR consensus guidelines: ?[place] Paper of the ACR Incidental Findings  Committee II on Vascular ?Findings. J Am Coll Radiol 2013; 10:789-794. ?2. Combination of small bilateral nephrolithiasis and ?atherosclerotic vascular calcifications. The largest individual ?stone measures 3 mm in the upper pole of the right kidney. No ?evidence of hydronephrosis or hydroureter. ?3. Approximately 1.7 cm bladder stone suggests chronic bladder ?outlet obstruction likely due to prostatomegaly. ?4. Marked prostatomegaly.  The prostate gland is approximately 82 g. ?5. 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 ? ?Aortic aneurysm NOS (ICD10-I71.9); Aortic Atherosclerosis ?(ICD10-I70.0). ? ? ?Electronically Signed ?By: Jacqulynn Cadet M.D. ?On: 06/30/2020 13:35 ? ? ?Assessment & Plan:   ? ?1.  BPH with urinary retention ?PVR today 811 mL ?Foley catheter placement recommended but he declined ?Rx tamsulosin sent to pharmacy ? ?2.  Bladder calculus ?17 mm bladder calculus on CT 1 year ago ?KUB ordered today ?We discussed recommendation of cystolitholapaxy ?Discussed outlet procedure at the time of cystolitholapaxy though there is a possibility this may not resolve his emptying if secondary to bladder hypotonicity.  Based on prostate size would consider HoLEP ? ?3.  Microhematuria ?Most likely secondary to bladder calculus ? ?4.  Prostate nodule ?PSA ordered ?Last PSA 01/2018 was 7.32 ? ?5.  Bilateral nephrolithiasis ? ? ?Abbie Sons, MD ? ?[facility] ?[address] ?[place], [place] [address] ?[phone] ? ?

## 2021-08-20 LAB — PSA: Prostate Specific Ag, Serum: 11 ng/mL — ABNORMAL HIGH (ref 0.0–4.0)

## 2021-08-28 ENCOUNTER — Telehealth: Payer: Self-pay

## 2021-08-28 NOTE — Telephone Encounter (Signed)
Tried to call patient, voicemail not set up

## 2021-08-28 NOTE — Telephone Encounter (Signed)
-----   Message from Abbie Sons, MD sent at 08/27/2021  4:28 PM EDT ----- PSA elevated 11.0.  Recommend scheduling follow-up appointment for cystoscopy and to discuss treatment options.  He also never got his KUB performed.

## 2023-01-06 ENCOUNTER — Other Ambulatory Visit: Payer: Self-pay | Admitting: Emergency Medicine

## 2023-01-06 DIAGNOSIS — R270 Ataxia, unspecified: Secondary | ICD-10-CM

## 2023-03-05 IMAGING — CT CT RENAL STONE PROTOCOL
2 of 4 series · 15 of 46 positions shown, 17 images · non-contrast
Comparison: CT abdomen/pelvis [DATE]

CLINICAL DATA: [REDACTED] history of left flank pain

EXAM:
CT ABDOMEN AND PELVIS WITHOUT CONTRAST
TECHNIQUE: Multidetector CT imaging of the abdomen and pelvis was performed
following the standard protocol without IV contrast.

[Series 2: stone full standard · axial · 0.72mm/px · z∈[-1180,-780]mm · 12 of 92 slices shown, 14 images]
[im 8/92  soft-tissue]
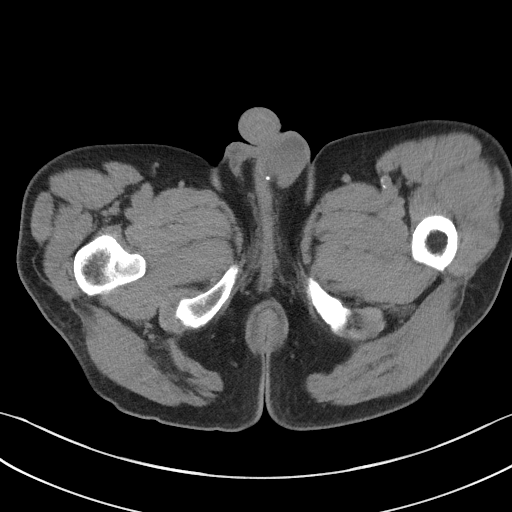
[im 8/92  bone]
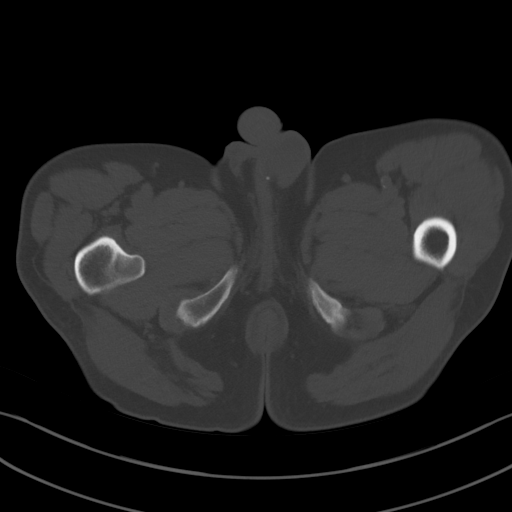
[im 15/92  soft-tissue]
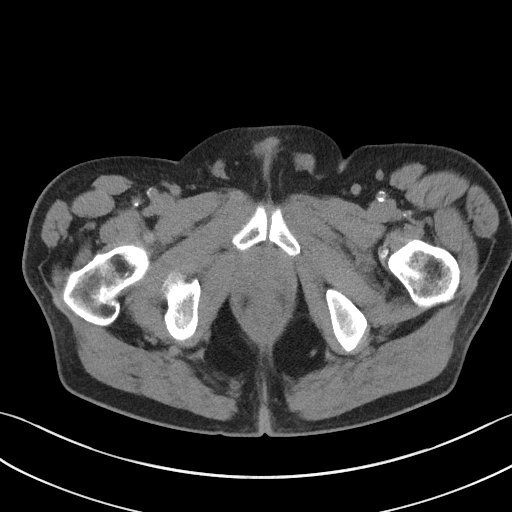
[im 22/92  soft-tissue]
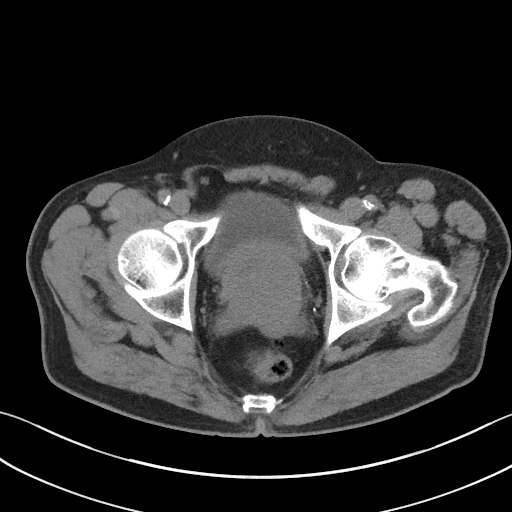
[im 30/92  soft-tissue]
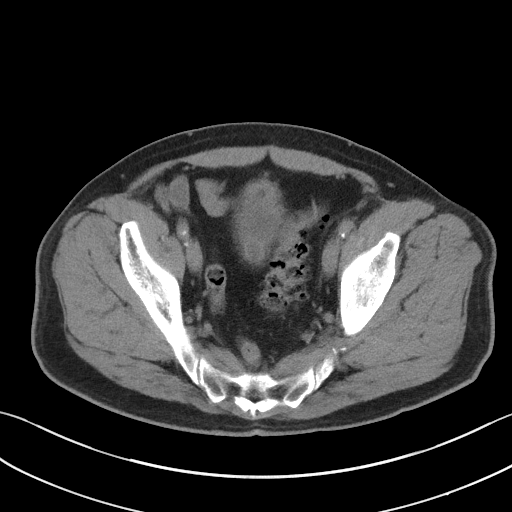
[im 37/92  soft-tissue]
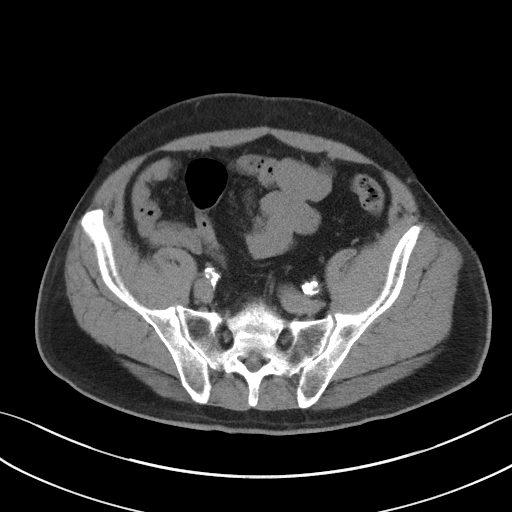
[im 44/92  soft-tissue]
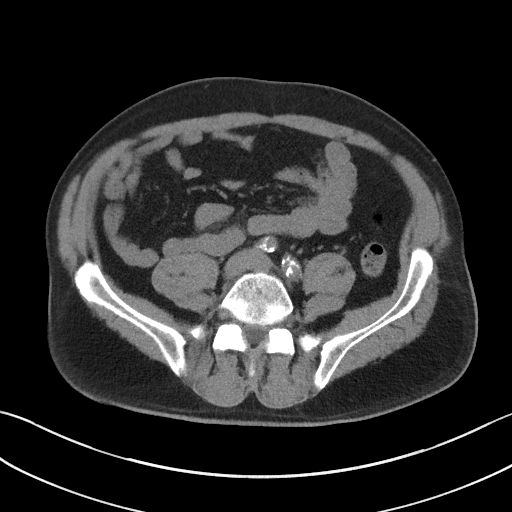
[im 51/92  soft-tissue]
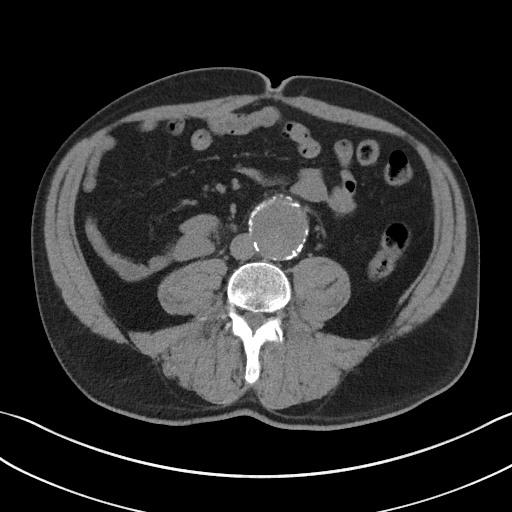
[im 59/92  soft-tissue]
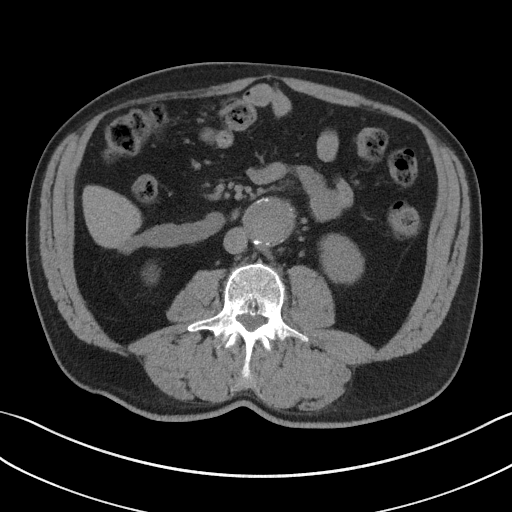
[im 66/92  soft-tissue]
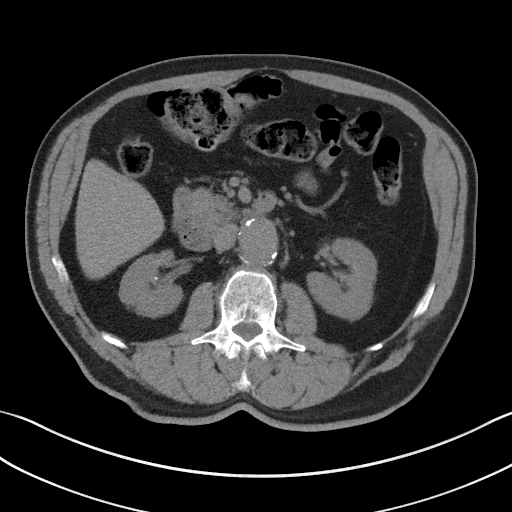
[im 66/92  bone]
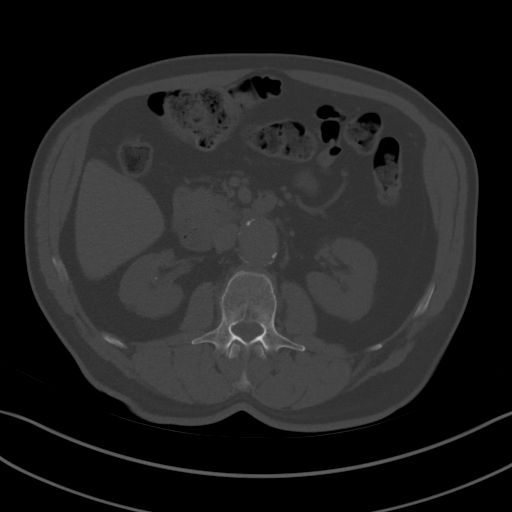
[im 73/92  soft-tissue]
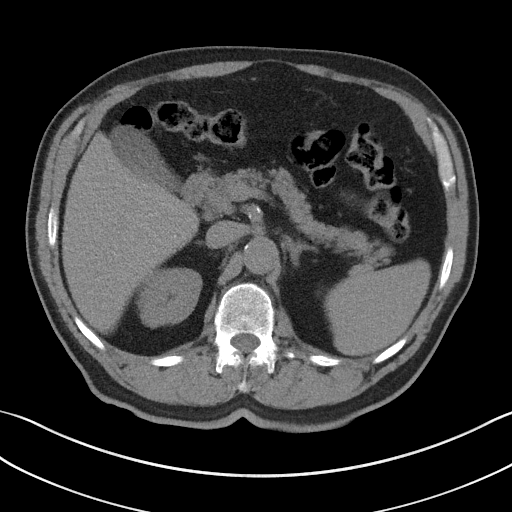
[im 81/92  soft-tissue]
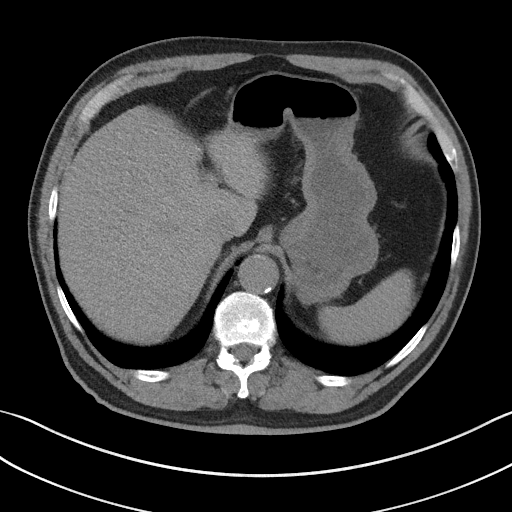
[im 88/92  soft-tissue]
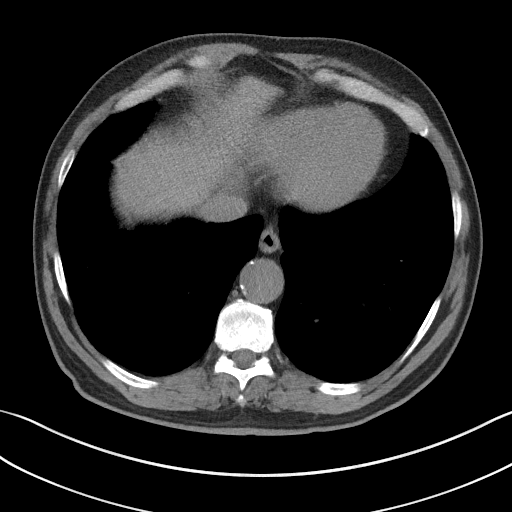

[Series 5: coronal · coronal · 0.74mm/px · 3 of 147 slices shown]
[im 49/147  soft-tissue]
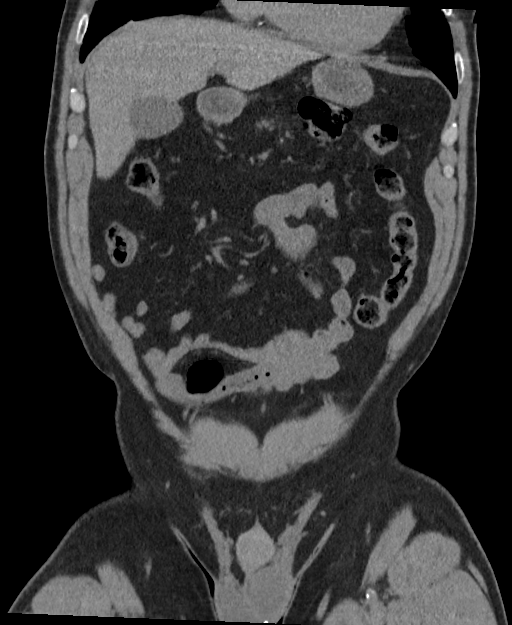
[im 65/147  soft-tissue]
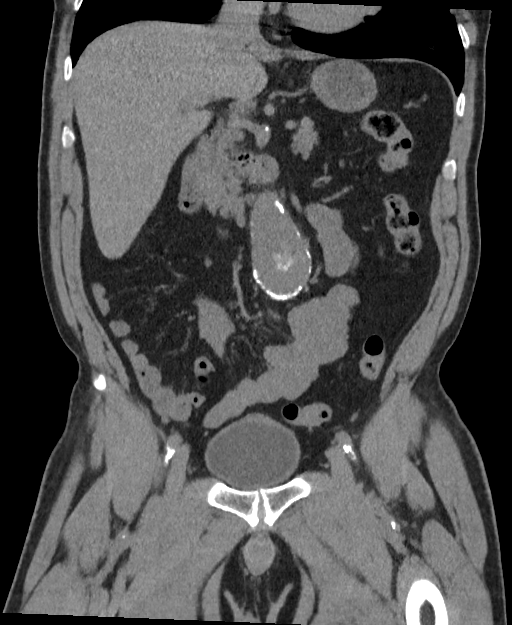
[im 82/147  soft-tissue]
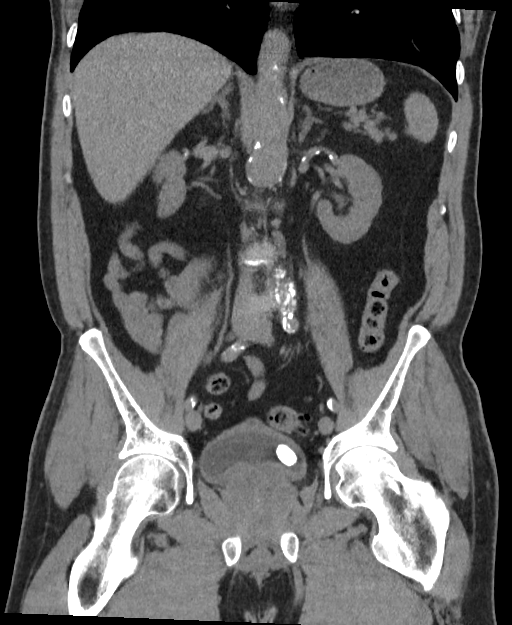

[15 of 46 positions shown; findings below may reference images not displayed]

FINDINGS: Lower chest: No acute abnormality.

Hepatobiliary: No focal liver abnormality is seen. No gallstones,
gallbladder wall thickening, or biliary dilatation.

Pancreas: Unremarkable. No pancreatic ductal dilatation or
surrounding inflammatory changes.

Spleen: Normal in size without focal abnormality.

Adrenals/Urinary Tract: Approximately 1.7 x 1.1 x 1.1 cm bladder
stone. Prostatomegaly. Punctate calcifications present in the renal
hila bilaterally, greater on the right than the left. This is in the
setting of multifocal calcifications within the renal arteries.
Findings likely represent a combination of calcified atherosclerotic
plaque and small kidney stones. The largest individual stone
measures 3 mm in the upper pole of the right kidney. No evidence of
hydronephrosis. Unremarkable ureters.

Stomach/Bowel: Colonic diverticular disease without CT evidence of
active inflammation. No evidence of obstruction or focal bowel wall
thickening.

Vascular/Lymphatic: Limited evaluation in the absence of intravenous
contrast. Fusiform aneurysmal dilation of the infrarenal abdominal
aorta with maximal diameters of 4.7 x 4.9 cm. Intraluminal
calcification likely represents ossification is prominent wall
adherent mural thrombus. No inflammatory changes or evidence of
impending rupture.

Reproductive: Marked prostatomegaly.The prostate gland measures
approximately 5.7 x 5.3 x 5.2 cm (volume = 82 cm^3).

Other: No abdominal wall hernia or abnormality. No abdominopelvic
ascites.

Musculoskeletal: No acute fracture or aggressive appearing lytic or
blastic osseous lesion.
IMPRESSION: 1. Fusiform infrarenal abdominal aortic aneurysm with maximal
diameter of 4.9 cm. Recommend follow-up every 6 months and vascular
consultation. This recommendation follows ACR consensus guidelines:
White Paper of the ACR Incidental Findings Committee II on Vascular
Findings. [HOSPITAL] 5189; 05/09/2008.
2. Combination of small bilateral nephrolithiasis and
atherosclerotic vascular calcifications. The largest individual
stone measures 3 mm in the upper pole of the right kidney. No
evidence of hydronephrosis or hydroureter.
3. Approximately 1.7 cm bladder stone suggests chronic bladder
outlet obstruction likely due to prostatomegaly.
4. Marked prostatomegaly.  The prostate gland is approximately 82 g.
5. Colonic diverticular disease without CT evidence of active
inflammation.

Aortic aneurysm NOS (XY31H-AFF.H); Aortic Atherosclerosis
(XY31H-EZP.P).

## 2023-04-03 ENCOUNTER — Other Ambulatory Visit: Payer: Self-pay

## 2023-04-03 ENCOUNTER — Emergency Department
Admission: EM | Admit: 2023-04-03 | Discharge: 2023-04-03 | Disposition: A | Discharge status: Against Medical Advice | Payer: Medicare Other | Attending: Emergency Medicine | Admitting: Emergency Medicine

## 2023-04-03 DIAGNOSIS — Z5321 Procedure and treatment not carried out due to patient leaving prior to being seen by health care provider: Secondary | ICD-10-CM | POA: Diagnosis not present

## 2023-04-03 DIAGNOSIS — R0602 Shortness of breath: Secondary | ICD-10-CM | POA: Diagnosis present

## 2023-04-03 LAB — CBC
HCT: 47.3 % (ref 39.0–52.0)
Hemoglobin: 15 g/dL (ref 13.0–17.0)
MCH: 26.5 pg (ref 26.0–34.0)
MCHC: 31.7 g/dL (ref 30.0–36.0)
MCV: 83.7 fL (ref 80.0–100.0)
Platelets: 121 10*3/uL — ABNORMAL LOW (ref 150–400)
RBC: 5.65 MIL/uL (ref 4.22–5.81)
RDW: 18.3 % — ABNORMAL HIGH (ref 11.5–15.5)
WBC: 6.2 10*3/uL (ref 4.0–10.5)
nRBC: 0 % (ref 0.0–0.2)

## 2023-04-03 LAB — BASIC METABOLIC PANEL
Anion gap: 12 (ref 5–15)
BUN: 22 mg/dL (ref 8–23)
CO2: 22 mmol/L (ref 22–32)
Calcium: 9.4 mg/dL (ref 8.9–10.3)
Chloride: 102 mmol/L (ref 98–111)
Creatinine, Ser: 1.5 mg/dL — ABNORMAL HIGH (ref 0.61–1.24)
GFR, Estimated: 50 mL/min — ABNORMAL LOW (ref >60)
Glucose, Bld: 110 mg/dL — ABNORMAL HIGH (ref 70–99)
Potassium: 4.5 mmol/L (ref 3.5–5.1)
Sodium: 136 mmol/L (ref 135–145)

## 2023-04-03 NOTE — ED Triage Notes (Signed)
C/O loss of balance for several months with falls.  Also c/o SOB. Pateint states "I have got something going on like I'm changing. Like I have some sort of disease".  Family states patient has declined over past month. Unable to care for his yard anymore.

## 2023-04-24 ENCOUNTER — Emergency Department: Payer: Medicare Other

## 2023-04-24 ENCOUNTER — Other Ambulatory Visit: Payer: Self-pay

## 2023-04-24 ENCOUNTER — Inpatient Hospital Stay
Admission: EM | Admit: 2023-04-24 | Discharge: 2023-04-30 | DRG: 064 | Disposition: A | Discharge status: Home / Self Care | Payer: Medicare Other | Attending: Internal Medicine | Admitting: Internal Medicine

## 2023-04-24 DIAGNOSIS — I63412 Cerebral infarction due to embolism of left middle cerebral artery: Secondary | ICD-10-CM | POA: Diagnosis present

## 2023-04-24 DIAGNOSIS — E871 Hypo-osmolality and hyponatremia: Secondary | ICD-10-CM | POA: Diagnosis not present

## 2023-04-24 DIAGNOSIS — G6181 Chronic inflammatory demyelinating polyneuritis: Secondary | ICD-10-CM | POA: Diagnosis present

## 2023-04-24 DIAGNOSIS — I255 Ischemic cardiomyopathy: Secondary | ICD-10-CM | POA: Diagnosis present

## 2023-04-24 DIAGNOSIS — R5383 Other fatigue: Principal | ICD-10-CM

## 2023-04-24 DIAGNOSIS — J9601 Acute respiratory failure with hypoxia: Secondary | ICD-10-CM | POA: Diagnosis present

## 2023-04-24 DIAGNOSIS — I252 Old myocardial infarction: Secondary | ICD-10-CM

## 2023-04-24 DIAGNOSIS — Z7902 Long term (current) use of antithrombotics/antiplatelets: Secondary | ICD-10-CM

## 2023-04-24 DIAGNOSIS — N400 Enlarged prostate without lower urinary tract symptoms: Secondary | ICD-10-CM | POA: Diagnosis present

## 2023-04-24 DIAGNOSIS — I10 Essential (primary) hypertension: Secondary | ICD-10-CM | POA: Diagnosis present

## 2023-04-24 DIAGNOSIS — R6520 Severe sepsis without septic shock: Secondary | ICD-10-CM | POA: Diagnosis present

## 2023-04-24 DIAGNOSIS — I639 Cerebral infarction, unspecified: Secondary | ICD-10-CM | POA: Diagnosis not present

## 2023-04-24 DIAGNOSIS — I130 Hypertensive heart and chronic kidney disease with heart failure and stage 1 through stage 4 chronic kidney disease, or unspecified chronic kidney disease: Secondary | ICD-10-CM | POA: Diagnosis present

## 2023-04-24 DIAGNOSIS — R918 Other nonspecific abnormal finding of lung field: Secondary | ICD-10-CM | POA: Diagnosis present

## 2023-04-24 DIAGNOSIS — I25119 Atherosclerotic heart disease of native coronary artery with unspecified angina pectoris: Secondary | ICD-10-CM | POA: Diagnosis present

## 2023-04-24 DIAGNOSIS — I5082 Biventricular heart failure: Secondary | ICD-10-CM | POA: Diagnosis present

## 2023-04-24 DIAGNOSIS — R29810 Facial weakness: Secondary | ICD-10-CM | POA: Diagnosis present

## 2023-04-24 DIAGNOSIS — E440 Moderate protein-calorie malnutrition: Secondary | ICD-10-CM | POA: Diagnosis present

## 2023-04-24 DIAGNOSIS — R29703 NIHSS score 3: Secondary | ICD-10-CM | POA: Diagnosis present

## 2023-04-24 DIAGNOSIS — R54 Age-related physical debility: Secondary | ICD-10-CM | POA: Diagnosis present

## 2023-04-24 DIAGNOSIS — R42 Dizziness and giddiness: Secondary | ICD-10-CM

## 2023-04-24 DIAGNOSIS — Z7901 Long term (current) use of anticoagulants: Secondary | ICD-10-CM

## 2023-04-24 DIAGNOSIS — J918 Pleural effusion in other conditions classified elsewhere: Secondary | ICD-10-CM | POA: Diagnosis present

## 2023-04-24 DIAGNOSIS — R296 Repeated falls: Secondary | ICD-10-CM | POA: Diagnosis present

## 2023-04-24 DIAGNOSIS — I2699 Other pulmonary embolism without acute cor pulmonale: Secondary | ICD-10-CM | POA: Diagnosis present

## 2023-04-24 DIAGNOSIS — R531 Weakness: Secondary | ICD-10-CM

## 2023-04-24 DIAGNOSIS — E785 Hyperlipidemia, unspecified: Secondary | ICD-10-CM | POA: Diagnosis present

## 2023-04-24 DIAGNOSIS — Z87442 Personal history of urinary calculi: Secondary | ICD-10-CM

## 2023-04-24 DIAGNOSIS — Z9181 History of falling: Secondary | ICD-10-CM

## 2023-04-24 DIAGNOSIS — C3431 Malignant neoplasm of lower lobe, right bronchus or lung: Secondary | ICD-10-CM | POA: Diagnosis present

## 2023-04-24 DIAGNOSIS — Z79899 Other long term (current) drug therapy: Secondary | ICD-10-CM

## 2023-04-24 DIAGNOSIS — J90 Pleural effusion, not elsewhere classified: Secondary | ICD-10-CM | POA: Diagnosis present

## 2023-04-24 DIAGNOSIS — I509 Heart failure, unspecified: Secondary | ICD-10-CM

## 2023-04-24 DIAGNOSIS — I513 Intracardiac thrombosis, not elsewhere classified: Secondary | ICD-10-CM | POA: Diagnosis present

## 2023-04-24 DIAGNOSIS — N401 Enlarged prostate with lower urinary tract symptoms: Secondary | ICD-10-CM | POA: Diagnosis present

## 2023-04-24 DIAGNOSIS — F17200 Nicotine dependence, unspecified, uncomplicated: Secondary | ICD-10-CM | POA: Diagnosis present

## 2023-04-24 DIAGNOSIS — N138 Other obstructive and reflux uropathy: Secondary | ICD-10-CM | POA: Diagnosis present

## 2023-04-24 DIAGNOSIS — N1831 Chronic kidney disease, stage 3a: Secondary | ICD-10-CM | POA: Diagnosis present

## 2023-04-24 DIAGNOSIS — I5031 Acute diastolic (congestive) heart failure: Secondary | ICD-10-CM | POA: Diagnosis not present

## 2023-04-24 DIAGNOSIS — I25118 Atherosclerotic heart disease of native coronary artery with other forms of angina pectoris: Secondary | ICD-10-CM | POA: Diagnosis present

## 2023-04-24 DIAGNOSIS — A419 Sepsis, unspecified organism: Secondary | ICD-10-CM | POA: Diagnosis present

## 2023-04-24 DIAGNOSIS — Z7982 Long term (current) use of aspirin: Secondary | ICD-10-CM

## 2023-04-24 DIAGNOSIS — Z046 Encounter for general psychiatric examination, requested by authority: Secondary | ICD-10-CM

## 2023-04-24 DIAGNOSIS — Z955 Presence of coronary angioplasty implant and graft: Secondary | ICD-10-CM

## 2023-04-24 DIAGNOSIS — Z1152 Encounter for screening for COVID-19: Secondary | ICD-10-CM

## 2023-04-24 DIAGNOSIS — Z8679 Personal history of other diseases of the circulatory system: Secondary | ICD-10-CM | POA: Diagnosis present

## 2023-04-24 DIAGNOSIS — I5022 Chronic systolic (congestive) heart failure: Secondary | ICD-10-CM

## 2023-04-24 DIAGNOSIS — R0902 Hypoxemia: Secondary | ICD-10-CM

## 2023-04-24 DIAGNOSIS — I714 Abdominal aortic aneurysm, without rupture, unspecified: Secondary | ICD-10-CM

## 2023-04-24 DIAGNOSIS — Z6820 Body mass index (BMI) 20.0-20.9, adult: Secondary | ICD-10-CM

## 2023-04-24 DIAGNOSIS — Z515 Encounter for palliative care: Secondary | ICD-10-CM | POA: Diagnosis not present

## 2023-04-24 DIAGNOSIS — I5023 Acute on chronic systolic (congestive) heart failure: Secondary | ICD-10-CM | POA: Diagnosis present

## 2023-04-24 LAB — COMPREHENSIVE METABOLIC PANEL
ALT: 22 U/L (ref 0–44)
AST: 29 U/L (ref 15–41)
Albumin: 2.9 g/dL — ABNORMAL LOW (ref 3.5–5.0)
Alkaline Phosphatase: 95 U/L (ref 38–126)
Anion gap: 12 (ref 5–15)
BUN: 32 mg/dL — ABNORMAL HIGH (ref 8–23)
CO2: 22 mmol/L (ref 22–32)
Calcium: 9.2 mg/dL (ref 8.9–10.3)
Chloride: 102 mmol/L (ref 98–111)
Creatinine, Ser: 1.55 mg/dL — ABNORMAL HIGH (ref 0.61–1.24)
GFR, Estimated: 48 mL/min — ABNORMAL LOW (ref >60)
Glucose, Bld: 100 mg/dL — ABNORMAL HIGH (ref 70–99)
Potassium: 4.1 mmol/L (ref 3.5–5.1)
Sodium: 136 mmol/L (ref 135–145)
Total Bilirubin: 3.5 mg/dL — ABNORMAL HIGH (ref 0.0–1.2)
Total Protein: 7.1 g/dL (ref 6.5–8.1)

## 2023-04-24 LAB — URINALYSIS, ROUTINE W REFLEX MICROSCOPIC
Bilirubin Urine: NEGATIVE
Glucose, UA: NEGATIVE mg/dL
Ketones, ur: NEGATIVE mg/dL
Leukocytes,Ua: NEGATIVE
Nitrite: NEGATIVE
Protein, ur: NEGATIVE mg/dL
Specific Gravity, Urine: 1.023 (ref 1.005–1.030)
pH: 6 (ref 5.0–8.0)

## 2023-04-24 LAB — CBC
HCT: 52.8 % — ABNORMAL HIGH (ref 39.0–52.0)
Hemoglobin: 16.6 g/dL (ref 13.0–17.0)
MCH: 26 pg (ref 26.0–34.0)
MCHC: 31.4 g/dL (ref 30.0–36.0)
MCV: 82.6 fL (ref 80.0–100.0)
Platelets: 97 10*3/uL — ABNORMAL LOW (ref 150–400)
RBC: 6.39 MIL/uL — ABNORMAL HIGH (ref 4.22–5.81)
RDW: 19.9 % — ABNORMAL HIGH (ref 11.5–15.5)
WBC: 5.3 10*3/uL (ref 4.0–10.5)
nRBC: 0 % (ref 0.0–0.2)

## 2023-04-24 LAB — ETHANOL: Alcohol, Ethyl (B): <10 mg/dL (ref <10)

## 2023-04-24 LAB — RESP PANEL BY RT-PCR (RSV, FLU A&B, COVID)  RVPGX2
Influenza A by PCR: NEGATIVE
Influenza B by PCR: NEGATIVE
Resp Syncytial Virus by PCR: NEGATIVE
SARS Coronavirus 2 by RT PCR: NEGATIVE

## 2023-04-24 LAB — TSH: TSH: 3.359 u[IU]/mL (ref 0.350–4.500)

## 2023-04-24 LAB — LACTIC ACID, PLASMA
Lactic Acid, Venous: 2.1 mmol/L (ref 0.5–1.9)
Lactic Acid, Venous: 1.4 mmol/L (ref 0.5–1.9)

## 2023-04-24 LAB — MAGNESIUM: Magnesium: 2.4 mg/dL (ref 1.7–2.4)

## 2023-04-24 LAB — BLOOD GAS, VENOUS

## 2023-04-24 LAB — PROTIME-INR
INR: 2.1 — ABNORMAL HIGH (ref 0.8–1.2)
Prothrombin Time: 23.7 s — ABNORMAL HIGH (ref 11.4–15.2)

## 2023-04-24 LAB — TROPONIN I (HIGH SENSITIVITY): Troponin I (High Sensitivity): 51 ng/L — ABNORMAL HIGH (ref <18)

## 2023-04-24 LAB — CBG MONITORING, ED: Glucose-Capillary: 101 mg/dL — ABNORMAL HIGH (ref 70–99)

## 2023-04-24 LAB — BRAIN NATRIURETIC PEPTIDE: B Natriuretic Peptide: >4500 pg/mL — ABNORMAL HIGH (ref 0.0–100.0)

## 2023-04-24 MED ORDER — SODIUM CHLORIDE 0.9 % IV SOLN
500.0000 mg | Freq: Once | INTRAVENOUS | Status: AC
  Administered 2023-04-24: 500 mg via INTRAVENOUS
  Filled 2023-04-24: qty 5

## 2023-04-24 MED ORDER — ACETAMINOPHEN 325 MG PO TABS
650.0000 mg | ORAL_TABLET | Freq: Four times a day (QID) | ORAL | Status: DC | PRN
  Administered 2023-04-26: 650 mg via ORAL
  Filled 2023-04-24: qty 2

## 2023-04-24 MED ORDER — HYDRALAZINE HCL 20 MG/ML IJ SOLN
10.0000 mg | Freq: Four times a day (QID) | INTRAMUSCULAR | Status: DC | PRN
  Administered 2023-04-25: 10 mg via INTRAVENOUS
  Filled 2023-04-24: qty 1

## 2023-04-24 MED ORDER — SODIUM CHLORIDE 0.9% FLUSH
3.0000 mL | Freq: Two times a day (BID) | INTRAVENOUS | Status: DC
  Administered 2023-04-25 – 2023-04-30 (×10): 3 mL via INTRAVENOUS

## 2023-04-24 MED ORDER — TAMSULOSIN HCL 0.4 MG PO CAPS
0.4000 mg | ORAL_CAPSULE | Freq: Every day | ORAL | Status: DC
  Administered 2023-04-25 – 2023-04-30 (×6): 0.4 mg via ORAL
  Filled 2023-04-24 (×6): qty 1

## 2023-04-24 MED ORDER — HEPARIN BOLUS VIA INFUSION
4800.0000 [IU] | Freq: Once | INTRAVENOUS | Status: AC
Start: 2023-04-24 — End: 2023-04-24
  Administered 2023-04-24: 4800 [IU] via INTRAVENOUS
  Filled 2023-04-24: qty 4800

## 2023-04-24 MED ORDER — ISOSORBIDE MONONITRATE ER 30 MG PO TB24
60.0000 mg | ORAL_TABLET | Freq: Every day | ORAL | Status: DC
  Administered 2023-04-25 – 2023-04-26 (×2): 60 mg via ORAL
  Filled 2023-04-24: qty 1
  Filled 2023-04-24: qty 2

## 2023-04-24 MED ORDER — SODIUM CHLORIDE 0.9 % IV SOLN
2.0000 g | Freq: Once | INTRAVENOUS | Status: AC
  Administered 2023-04-24: 2 g via INTRAVENOUS
  Filled 2023-04-24: qty 20

## 2023-04-24 MED ORDER — MORPHINE SULFATE (PF) 2 MG/ML IV SOLN
2.0000 mg | INTRAVENOUS | Status: DC | PRN
Start: 2023-04-24 — End: 2023-04-30

## 2023-04-24 MED ORDER — ATORVASTATIN CALCIUM 20 MG PO TABS
80.0000 mg | ORAL_TABLET | Freq: Every evening | ORAL | Status: DC
  Administered 2023-04-25 – 2023-04-29 (×4): 80 mg via ORAL
  Filled 2023-04-24 (×4): qty 4

## 2023-04-24 MED ORDER — AMLODIPINE BESYLATE 5 MG PO TABS
5.0000 mg | ORAL_TABLET | Freq: Every day | ORAL | Status: DC
  Administered 2023-04-25: 5 mg via ORAL
  Filled 2023-04-24: qty 1

## 2023-04-24 MED ORDER — IOHEXOL 350 MG/ML SOLN
75.0000 mL | Freq: Once | INTRAVENOUS | Status: AC | PRN
  Administered 2023-04-24: 75 mL via INTRAVENOUS

## 2023-04-24 MED ORDER — ACETAMINOPHEN 650 MG RE SUPP: 650.0000 mg | Freq: Four times a day (QID) | RECTAL | Status: DC | PRN

## 2023-04-24 MED ORDER — METOPROLOL TARTRATE 25 MG PO TABS
25.0000 mg | ORAL_TABLET | Freq: Two times a day (BID) | ORAL | Status: DC
  Administered 2023-04-25 (×2): 25 mg via ORAL
  Filled 2023-04-24 (×2): qty 1

## 2023-04-24 MED ORDER — SODIUM CHLORIDE 0.9 % IV SOLN
2.0000 g | INTRAVENOUS | Status: AC
  Administered 2023-04-24 – 2023-04-28 (×4): 2 g via INTRAVENOUS
  Filled 2023-04-24 (×5): qty 20

## 2023-04-24 MED ORDER — LACTATED RINGERS IV SOLN: INTRAVENOUS | Status: DC

## 2023-04-24 MED ORDER — FUROSEMIDE 10 MG/ML IJ SOLN
40.0000 mg | Freq: Once | INTRAMUSCULAR | Status: AC
  Administered 2023-04-24: 40 mg via INTRAVENOUS
  Filled 2023-04-24: qty 4

## 2023-04-24 MED ORDER — HEPARIN (PORCINE) 25000 UT/250ML-% IV SOLN
1200.0000 [IU]/h | INTRAVENOUS | Status: DC
  Administered 2023-04-24: 1200 [IU]/h via INTRAVENOUS
  Filled 2023-04-24: qty 250

## 2023-04-24 NOTE — Assessment & Plan Note (Deleted)
Stable currently patient without any chest pain, troponin mildly elevated suspect secondary to demand.  Patient is currently on heparin gtt. for his mural thrombus.  Will continue patient on metoprolol and Imdur as blood pressure allows. Will have to verify that patient is on aspirin and Plavix which I will hold as patient is on heparin gtt.

## 2023-04-24 NOTE — Assessment & Plan Note (Signed)
Ruled out

## 2023-04-24 NOTE — ED Notes (Signed)
Patient transported to CT 

## 2023-04-24 NOTE — Assessment & Plan Note (Signed)
Today's creatinine 1.55  with a GFR of 48

## 2023-04-24 NOTE — ED Notes (Signed)
Applied 2L [place] and O2 came up, then went back down. Pt now on 4L [place].

## 2023-04-24 NOTE — Assessment & Plan Note (Signed)
 Nicotine patch. Counseling once patient is stable.

## 2023-04-24 NOTE — ED Notes (Signed)
Received 2 sets of blood work for this patient. One set has chart labels and one set has sunquest labels. Rosemarie Beath, RN to confirm which set of blood he would like [place] to run for this patient. He confirmed that he personally drew Mr. Langill and put chart labels on the blood. Proceeded to run test under accession [id number] and [phone] on blood sent on chart labels.

## 2023-04-24 NOTE — ED Notes (Signed)
Changed pt's sheets and removed personal clothing and place pt in hospital gown due to sheets being soaked with urine.

## 2023-04-24 NOTE — ED Notes (Signed)
RN notified Dr. Arnoldo Morale of pt EKG.

## 2023-04-24 NOTE — Assessment & Plan Note (Signed)
Patient initially presenting with generalized weakness and multiple complaints of weight loss not feeling well.  Based on evaluation so far Findings are concerning for possible cancer contributing to this.  We will treat the underlying illness and suspect patient will require short-term rehab or aggressive PT to get to his baseline.

## 2023-04-24 NOTE — ED Provider Notes (Addendum)
[facility] Provider Note    Event Date/Time   First MD Initiated Contact with Patient 04/24/23 1614     (approximate)   History   Fatigue   HPI  Dennis Church is a 68 y.o. male past medical tree significant for CAD, hypertension, hyperlipidemia, who presents to the emergency department with multiple complaints.  Patient states that he has been having multiple falls with dizziness and feeling very off balance.  Denies any alcohol use.  States that he has had multiple falls over the past couple of weeks.  States that he is not hungry and only eats a couple bites of food every day.  Does state that he has access to food but he is just not hungry.  Endorses a significant amount of weight loss.  Daily tobacco use.  States that he has been feeling short of breath and having swelling on his legs.  Denies any history of DVT or PE.  No recent travel.  Denies being on any anticoagulation.     Physical Exam   Triage Vital Signs: ED Triage Vitals  Encounter Vitals Group     BP 04/24/23 1515 (!) 180/134     Systolic BP Percentile --      Diastolic BP Percentile --      Pulse Rate 04/24/23 1515 88     Resp 04/24/23 1515 18     Temp 04/24/23 1515 98 F (36.7 C)     Temp Source 04/24/23 1515 Oral     SpO2 04/24/23 1515 98 %     Weight 04/24/23 1516 175 lb 0.7 oz (79.4 kg)     Height 04/24/23 1626 5\' 5"  (1.651 m)     Head Circumference --      Peak Flow --      Pain Score 04/24/23 1515 0     Pain Loc --      Pain Education --      Exclude from Growth Chart --     Most recent vital signs: Vitals:   04/24/23 1626 04/24/23 1810  BP: (!) 182/143 (!) 159/123  Pulse: 86 83  Resp: (!) 23 19  Temp:    SpO2: 94% 94%    Physical Exam Constitutional:      Appearance: He is well-developed.  HENT:     Head: Atraumatic.  Eyes:     Extraocular Movements: Extraocular movements intact.     Conjunctiva/sclera: Conjunctivae normal.     Pupils: Pupils are equal,  round, and reactive to light.  Cardiovascular:     Rate and Rhythm: Regular rhythm.  Pulmonary:     Effort: No respiratory distress.     Breath sounds: Rhonchi present.     Comments: Hypoxic to 88% requiring 4 L nasal cannula Abdominal:     Tenderness: There is no abdominal tenderness.  Musculoskeletal:        General: Swelling present.     Cervical back: Normal range of motion.     Comments: Pitting edema to bilateral lower extremities with no unilateral leg swelling  Skin:    General: Skin is warm.     Capillary Refill: Capillary refill takes 2 to 3 seconds.  Neurological:     Mental Status: He is alert. Mental status is at baseline.     Comments: Dysmetria with bilateral finger-to-nose.  No nystagmus.  5/5 strength bilateral upper and lower extremities.     IMPRESSION / MDM / ASSESSMENT AND PLAN / ED COURSE  I reviewed  the triage vital signs and the nursing notes.  On arrival patient is significantly hypertensive, hypoxic to 88% requiring 4 L nasal cannula.  Differential diagnosis including COPD exacerbation, CHF that is new onset, ACS, malignancy, pulmonary embolism, intracranial hemorrhage, CVA  EKG  I, Corena Herter, the attending physician, personally viewed and interpreted this ECG.  Significant artifact on EKG.  Significantly prolonged QTc within interval of 700s.  Nonspecific ST changes.  This is a significant change when compared to prior EKG.  Repeating EKG.  No tachycardic or bradycardic dysrhythmias while on cardiac telemetry.  RADIOLOGY I independently reviewed imaging, my interpretation of imaging: Appears to have cardiomegaly with pleural effusion and questionable right lower lobe pneumonia versus effusion.  Discussed CT scans with the radiologist on-call.  Concern for large pleural effusion with questionable pneumonia versus mass.  Does have abdominal ascites but no obvious malignancy in the abdomen.  CT scan of the head without signs of intracranial  hemorrhage.  Questionable findings of delayed filling of either chronic findings from his large effusion versus pulmonary embolism however also noted a questionable mural thrombus.    LABS (all labs ordered are listed, but only abnormal results are displayed) Labs interpreted as -    Labs Reviewed  CBC - Abnormal; Notable for the following components:      Result Value   RBC 6.39 (*)    HCT 52.8 (*)    RDW 19.9 (*)    Platelets 97 (*)    All other components within normal limits  URINALYSIS, ROUTINE W REFLEX MICROSCOPIC - Abnormal; Notable for the following components:   Color, Urine YELLOW (*)    APPearance HAZY (*)    Hgb urine dipstick LARGE (*)    Bacteria, UA RARE (*)    All other components within normal limits  COMPREHENSIVE METABOLIC PANEL - Abnormal; Notable for the following components:   Glucose, Bld 100 (*)    BUN 32 (*)    Creatinine, Ser 1.55 (*)    Albumin 2.9 (*)    Total Bilirubin 3.5 (*)    GFR, Estimated 48 (*)    All other components within normal limits  BLOOD GAS, VENOUS - Abnormal; Notable for the following components:   Bicarbonate 31.9 (*)    Acid-Base Excess 5.3 (*)    All other components within normal limits  CBG MONITORING, ED - Abnormal; Notable for the following components:   Glucose-Capillary 101 (*)    All other components within normal limits  TROPONIN I (HIGH SENSITIVITY) - Abnormal; Notable for the following components:   Troponin I (High Sensitivity) 51 (*)    All other components within normal limits  RESP PANEL BY RT-PCR (RSV, FLU A&B, COVID)  RVPGX2  CULTURE, BLOOD (ROUTINE X 2)  CULTURE, BLOOD (ROUTINE X 2)  MAGNESIUM  TSH  BRAIN NATRIURETIC PEPTIDE  LACTIC ACID, PLASMA  LACTIC ACID, PLASMA     MDM    Patient with acute hypoxic respiratory failure.  Multiple abnormalities concerning for large pleural effusion, questionable underlying malignancy, questionable underlying pneumonia and possible pulmonary embolism.  Patient  requiring new oxygen.  Increase in size of aortic aneurysm.  No abdominal pain at this time.  Patient's increased to 5.9 from 4.8.  Given questionable pneumonia, unable to exclude and does have a cough blood cultures obtained and will obtain a lactic acid.  Started on IV Rocephin and azithromycin.  Repeat EKG with normal QT interval.  Started on IV Lasix for effusion.  Felt that 30 cc/kg  of IV fluids or any IV fluids would be detrimental to the patient.  Started on heparin bolus infusion for questionable pulmonary embolism and mural thrombus.  Consulted hospitalist for admission for acute hypoxic respiratory failure.   PROCEDURES:  Critical Care performed: yes  .Critical Care  Performed by: Corena Herter, MD Authorized by: Corena Herter, MD   Critical care provider statement:    Critical care time (minutes):  45   Critical care time was exclusive of:  Separately billable procedures and treating other patients   Critical care was necessary to treat or prevent imminent or life-threatening deterioration of the following conditions:  Respiratory failure   Critical care was time spent personally by me on the following activities:  Development of treatment plan with patient or surrogate, discussions with consultants, evaluation of patient's response to treatment, examination of patient, ordering and review of laboratory studies, ordering and review of radiographic studies, ordering and performing treatments and interventions, pulse oximetry, re-evaluation of patient's condition and review of old charts   Patient's presentation is most consistent with acute presentation with potential threat to life or bodily function.   MEDICATIONS ORDERED IN ED: Medications  cefTRIAXone (ROCEPHIN) 2 g in sodium chloride 0.9 % 100 mL IVPB (has no administration in time range)  azithromycin (ZITHROMAX) 500 mg in sodium chloride 0.9 % 250 mL IVPB (has no administration in time range)  furosemide (LASIX)  injection 40 mg (has no administration in time range)  iohexol (OMNIPAQUE) 350 MG/ML injection 75 mL (75 mLs Intravenous Contrast Given 04/24/23 1714)    FINAL CLINICAL IMPRESSION(S) / ED DIAGNOSES   Final diagnoses:  Other fatigue  Pleural effusion  Hypoxia  Dizzy     Rx / DC Orders   ED Discharge Orders     None        Note:  This document was prepared using Dragon voice recognition software and may include unintentional dictation errors.   Corena Herter, MD 04/24/23 Kristeen Mans    Corena Herter, MD 04/24/23 1610

## 2023-04-24 NOTE — ED Provider Triage Note (Signed)
Emergency Medicine Provider Triage Evaluation Note  DASHIELL CHEVERE, a 68 y.o. male  was evaluated in triage.  Pt complains of generalized weakness as well as unexpected and unplanned weight loss and decreased energy.  Patient would endorse symptoms have been present for the last several months.  He would also endorse decreased appetite.  Patient with history of hypertension, BPH, CAD, HLD reports he takes the medications as prescribed.  By his report, has not been evaluated by his cardiologist since the pandemic.  Patient presented to this ED in December for the same complaint, but left secondary to the protracted wait prior to being evaluated.  Review of Systems  Positive: Weakness, fatigue, anorexia Negative: FCS, CP  Physical Exam  BP (!) 180/134 (BP Location: Left Arm)   Pulse 88   Temp 98 F (36.7 C) (Oral)   Resp 18   Wt 79.4 kg   SpO2 98%   BMI 25.12 kg/m  Gen:   Awake, no distress  NAD Resp:  Normal effort CTA MSK:   Moves extremities without difficulty  Other:    Medical Decision Making  Medically screening exam initiated at 3:36 PM.  Appropriate orders placed.  GAELEN RAYAS was informed that the remainder of the evaluation will be completed by another provider, this initial triage assessment does not replace that evaluation, and the importance of remaining in the ED until their evaluation is complete.  Geriatric patient to the ED for evaluation of generalized weakness, anorexia, and weight loss.   Lissa Hoard, PA-C 04/24/23 1538

## 2023-04-24 NOTE — Progress Notes (Signed)
PHARMACY - ANTICOAGULATION CONSULT NOTE  Pharmacy Consult for heparin Indication: pulmonary embolus  No Known Allergies  Patient Measurements: Height: 5\' 5"  (165.1 cm) Weight: 71.2 kg (157 lb) IBW/kg (Calculated) : 61.5 Heparin Dosing Weight: 71.2 kg  Vital Signs: Temp: 98 F (36.7 C) (01/19 1515) Temp Source: Oral (01/19 1515) BP: 159/123 (01/19 1810) Pulse Rate: 83 (01/19 1810)  Labs: Recent Labs    04/24/23 1524  HGB 16.6  HCT 52.8*  PLT 97*  CREATININE 1.55*  TROPONINIHS 51*    Estimated Creatinine Clearance: 39.1 mL/min (A) (by C-G formula based on SCr of 1.55 mg/dL (H)).   Medical History: Past Medical History:  Diagnosis Date   Anginal pain (HCC)    Coronary artery disease    Hyperlipidemia    Hypertension    Myocardial infarction [facility])    Renal disorder    kidney stones   Assessment: 68 y/o male presenting with dizziness and decreased po intake. PMH significant for CAD, hypertension, hyperlipidemia. CT imaging concerning for pulmonary embolism (vs malignancy) and mural thrombus. Pharmacy has been consulted to initiate heparin infusion. Per chart review, patient is not on anticoagulation prior to admission.  Baseline labs: hgb 16.6, plt 97, INR ordered  Goal of Therapy:  Heparin level 0.3-0.7 units/ml Monitor platelets by anticoagulation protocol: Yes   Plan:  Give 4800 units bolus x 1 Start heparin infusion at 1200 units/hr Check anti-Xa level in 6 hours and daily while on heparin Continue to monitor H&H and platelets  Thank you for involving pharmacy in this patient's care.   Rockwell Alexandria, PharmD Clinical Pharmacist 04/24/2023 6:26 PM

## 2023-04-24 NOTE — Assessment & Plan Note (Signed)
Patient has BPH history and is on Flomax, chart review shows a previous PSA elevation at 11.  Will repeat a PSA level and indwelling Foley.

## 2023-04-24 NOTE — ED Triage Notes (Signed)
Pt sts that he has been loosing weight and having no energy. Pt sts that this has been going on for months and that he is just tired of feeling this way.

## 2023-04-24 NOTE — Assessment & Plan Note (Signed)
Not commented on echocardiogram but CT scan.  On Eliquis.  CT scan could not rule out pulmonary embolism.

## 2023-04-24 NOTE — Progress Notes (Signed)
CODE SEPSIS - PHARMACY COMMUNICATION  **Broad Spectrum Antibiotics should be administered within 1 hour of Sepsis diagnosis**  Time Code Sepsis Called/Page Received: 1817  Antibiotics Ordered: ceftriaxone and azithromycin  Time of 1st antibiotic administration: 1842  Additional action taken by pharmacy: None  If necessary, Name of Provider/Nurse Contacted: None    Rockwell Alexandria ,PharmD Clinical Pharmacist  04/24/2023  6:35 PM

## 2023-04-24 NOTE — Assessment & Plan Note (Signed)
Vitals:   04/24/23 1515 04/24/23 1626 04/24/23 1800 04/24/23 1810  BP: (!) 180/134 (!) 182/143 (!) 196/136 (!) 159/123   04/24/23 1900 04/24/23 2000 04/24/23 2200 04/24/23 2322  BP: (!) 193/138 (!) 172/114 (!) 148/97 (!) 167/126  Will continue patient on metoprolol and Imdur and amlodipine, as needed hydralazine.

## 2023-04-24 NOTE — Assessment & Plan Note (Signed)
Thoracentesis with cytology since patient has a lung mass.

## 2023-04-24 NOTE — Assessment & Plan Note (Signed)
Patient with hypoxia in the emergency room of 88%.  Placed on 4 L.

## 2023-04-24 NOTE — Assessment & Plan Note (Signed)
Ordered a thoracentesis with cytology since the patient has a lung mass.

## 2023-04-24 NOTE — H&P (Addendum)
History and Physical    Patient: Dennis Church ZOX:[id number] DOB: September 20, 1955 DOA: 04/24/2023 DOS: the patient was seen and examined on 04/24/2023 PCP: Patient, No Pcp Per  Patient coming from: Home Chief complaint: Chief Complaint  Patient presents with   Fatigue   HPI:  Dennis Church is a 68 y.o. male with past medical history  of CAD, generalized weakness, BPH, benign essential hypertension, tobacco abuse, hyperlipidemia presenting today with chest generalized weakness since the past 2 to 3 months or so with weight loss and malaise and fatigue.  Per nurses report patient was brought by neighbor he looks unkempt and is not able to take care of himself lives at home.  Per report patient has been dizzy multiple complaints of feeling weak, loss of appetite, patient reports no energy.  He is alert awake and oriented but is a limited historian.  >>ED Course: In emergency room patient is ill-appearing and tachypneic not in respiratory distress is able to complete sentences meeting sepsis criteria. Vitals:   04/24/23 1936 04/24/23 2000 04/24/23 2200 04/24/23 2322  BP:  (!) 172/114 (!) 148/97 (!) 167/126  Pulse:  85 95 94  Temp: (!) 97 F (36.1 C)     Resp:  (!) 24 (!) 26 19  Height:      Weight:      SpO2:  95% 91% 98%  TempSrc: Axillary     BMI (Calculated):      >> Workup so far: Vitals since arrival patient has remained stable with heart rate in the 80s respiratory rate has been in the high 20s, hypoxia at 88% on room air patient initially started on 2 L and increased to 4 L.  Venous blood gas within normal limits with  pCO2 of 54 normal pH. CMP shows creatinine of 1.5 EGFR 48, BUN of 32 normal electrolytes otherwise ALT AST 2922 and total bili elevated at 3.5 normal alk phos. EKG is abnormal with low voltage prolonged QT of 700s nonspecific ST changes and anterolateral infarct with Q waves in anterior and lateral leads diffusely, troponin of 51 BNP of more than 4500. CBC shows a normal  Jirak count of 5.3 hemoglobin of 16.6 platelet count at 97,000. Respiratory panel negative for flu COVID and RSV.Marland Kitchen Urinalysis collected today shows 0-5 WBCs rare bacteria negative nitrites large hemoglobin yellow in color. Alcohol level less than 10 . Blood cultures collected in the ED. Initial chest x-ray shows congestive heart failure small left pleural effusion and right moderate pleural fusion, right lung base opacity. Received message from nurse about patient having 650 mL of urine in his bladder indwelling Foley requested. CT of the chest angio PE protocol shows: -Possible PE with decreased enhancement in the right middle and right lower lobe pulmonary arteries. - Right lower lobe mass of 3.6 cm with consolidation concerning for neoplasm. - Large right pleural effusion excess of 2 L. Volume loss and peripheral consolidation in the right lower lobe suspect postobstructive collapse or compressive atelectasis from right pleural effusion. -Cardiomegaly, with prominent left ventricular dilatation. Small mural thrombi at the left ventricular apex. - Patient has moderate ascites. - Enlarged prostate with chronic bladder outlet obstruction. - 5.9 cm abdominal aortic aneurysm with no evidence of leak or rupture.  In the ED pt received Medications  azithromycin (ZITHROMAX) 500 mg in sodium chloride 0.9 % 250 mL IVPB (500 mg Intravenous New Bag/Given 04/24/23 2004)  heparin ADULT infusion 100 units/mL (25000 units/252mL) (1,200 Units/hr Intravenous New Bag/Given 04/24/23 1854)  sodium  chloride flush (NS) 0.9 % injection 3 mL (has no administration in time range)  acetaminophen (TYLENOL) tablet 650 mg (has no administration in time range)    Or  acetaminophen (TYLENOL) suppository 650 mg (has no administration in time range)  morphine (PF) 2 MG/ML injection 2 mg (has no administration in time range)  lactated ringers infusion (has no administration in time range)  iohexol (OMNIPAQUE) 350 MG/ML  injection 75 mL (75 mLs Intravenous Contrast Given 04/24/23 1714)  cefTRIAXone (ROCEPHIN) 2 g in sodium chloride 0.9 % 100 mL IVPB (0 g Intravenous Stopped 04/24/23 1859)  furosemide (LASIX) injection 40 mg (40 mg Intravenous Given 04/24/23 1856)  heparin bolus via infusion 4,800 Units (4,800 Units Intravenous Bolus from Bag 04/24/23 1854)  Review of Systems  Constitutional:  Positive for malaise/fatigue and weight loss.  Respiratory:  Positive for cough, shortness of breath and wheezing.   Neurological:  Positive for weakness.   Past Medical History:  Diagnosis Date   Anginal pain (HCC)    Coronary artery disease    Hyperlipidemia    Hypertension    Myocardial infarction [facility])    Renal disorder    kidney stones   Past Surgical History:  Procedure Laterality Date   CARDIAC CATHETERIZATION Left 04/25/2015   Procedure: Left Heart Cath and Coronary Angiography;  Surgeon: Dalia Heading, MD;  Location: [facility] INVASIVE CV LAB;  Service: Cardiovascular;  Laterality: Left;   CAROTID ENDARTERECTOMY Left    CORONARY ANGIOPLASTY WITH STENT PLACEMENT     LITHOTRIPSY      reports that he has quit smoking. He has never used smokeless tobacco. He reports that he does not drink alcohol. No history on file for drug use.  No Known Allergies  No family history on file.  Prior to Admission medications   Medication Sig Start Date End Date Taking? Authorizing Provider  amLODipine (NORVASC) 5 MG tablet Take 1 tablet (5 mg total) by mouth daily. 06/30/20 08/29/20  Chesley Noon, MD  aspirin 81 MG tablet Take 81 mg by mouth daily.    [provider]  atorvastatin (LIPITOR) 80 MG tablet Take 80 mg by mouth every evening.    [provider]  clopidogrel (PLAVIX) 75 MG tablet Take 75 mg by mouth daily.    [provider]  isosorbide mononitrate (IMDUR) 60 MG 24 hr tablet Take 60 mg by mouth daily.    [provider]  metoprolol tartrate (LOPRESSOR) 25 MG tablet Take 25 mg by  mouth 2 (two) times daily.    [provider]  nitroGLYCERIN (NITROSTAT) 0.4 MG SL tablet Place 0.4 mg under the tongue every 5 (five) minutes x 3 doses as needed.    [provider]  tamsulosin (FLOMAX) 0.4 MG CAPS capsule Take 1 capsule (0.4 mg total) by mouth daily. 08/19/21   Riki Altes, MD   Vitals:   04/24/23 1936 04/24/23 2000 04/24/23 2200 04/24/23 2322  BP:  (!) 172/114 (!) 148/97 (!) 167/126  Pulse:  85 95 94  Resp:  (!) 24 (!) 26 19  Temp: (!) 97 F (36.1 C)     TempSrc: Axillary     SpO2:  95% 91% 98%  Weight:      Height:       Physical Exam Vitals and nursing note reviewed.  Constitutional:      General: He is awake. He is not in acute distress.    Appearance: He is ill-appearing.     Interventions: Nasal cannula  in place.  HENT:     Head: Normocephalic and atraumatic.     Right Ear: Hearing normal.     Left Ear: Hearing normal.     Nose: No nasal deformity.     Left Nostril: Epistaxis present.     Comments: Left nares dry blood.  Eyes:     General: Lids are normal.     Extraocular Movements: Extraocular movements intact.  Cardiovascular:     Rate and Rhythm: Normal rate and regular rhythm.     Pulses: Normal pulses.     Heart sounds: Normal heart sounds.  Pulmonary:     Effort: Tachypnea present. No respiratory distress.     Breath sounds: Examination of the right-middle field reveals decreased breath sounds. Examination of the right-lower field reveals decreased breath sounds. Examination of the left-lower field reveals decreased breath sounds. Decreased breath sounds present.  Abdominal:     General: Bowel sounds are normal. There is distension.     Palpations: Abdomen is soft. There is no mass.     Tenderness: There is no abdominal tenderness.  Musculoskeletal:     Right lower leg: No edema.     Left lower leg: No edema.  Skin:    General: Skin is warm and dry.  Neurological:     General: No focal deficit present.     Mental  Status: He is alert and oriented to person, place, and time.     Cranial Nerves: Cranial nerves 2-12 are intact.  Psychiatric:        Attention and Perception: Attention normal.        Speech: Speech normal.        Behavior: Behavior is cooperative.    Labs on Admission: I have personally reviewed following labs and imaging studies Results for orders placed or performed during the hospital encounter of 04/24/23 (from the past 24 hours)  Urinalysis, Routine w reflex microscopic -Urine, Clean Catch     Status: Abnormal   Collection Time: 04/24/23  3:20 PM  Result Value Ref Range   Color, Urine YELLOW (A) YELLOW   APPearance HAZY (A) CLEAR   Specific Gravity, Urine 1.023 1.005 - 1.030   pH 6.0 5.0 - 8.0   Glucose, UA NEGATIVE NEGATIVE mg/dL   Hgb urine dipstick LARGE (A) NEGATIVE   Bilirubin Urine NEGATIVE NEGATIVE   Ketones, ur NEGATIVE NEGATIVE mg/dL   Protein, ur NEGATIVE NEGATIVE mg/dL   Nitrite NEGATIVE NEGATIVE   Leukocytes,Ua NEGATIVE NEGATIVE   RBC / HPF 0-5 0 - 5 RBC/hpf   WBC, UA 0-5 0 - 5 WBC/hpf   Bacteria, UA RARE (A) NONE SEEN   Squamous Epithelial / HPF 6-10 0 - 5 /HPF   Mucus PRESENT   CBC     Status: Abnormal   Collection Time: 04/24/23  3:24 PM  Result Value Ref Range   WBC 5.3 4.0 - 10.5 K/uL   RBC 6.39 (H) 4.22 - 5.81 MIL/uL   Hemoglobin 16.6 13.0 - 17.0 g/dL   HCT 19.1 (H) 47.8 - 29.5 %   MCV 82.6 80.0 - 100.0 fL   MCH 26.0 26.0 - 34.0 pg   MCHC 31.4 30.0 - 36.0 g/dL   RDW 62.1 (H) 30.8 - 65.7 %   Platelets 97 (L) 150 - 400 K/uL   nRBC 0.0 0.0 - 0.2 %  Comprehensive metabolic panel     Status: Abnormal   Collection Time: 04/24/23  3:24 PM  Result Value Ref Range   Sodium  136 135 - 145 mmol/L   Potassium 4.1 3.5 - 5.1 mmol/L   Chloride 102 98 - 111 mmol/L   CO2 22 22 - 32 mmol/L   Glucose, Bld 100 (H) 70 - 99 mg/dL   BUN 32 (H) 8 - 23 mg/dL   Creatinine, Ser 1.61 (H) 0.61 - 1.24 mg/dL   Calcium 9.2 8.9 - 09.6 mg/dL   Total Protein 7.1 6.5 - 8.1  g/dL   Albumin 2.9 (L) 3.5 - 5.0 g/dL   AST 29 15 - 41 U/L   ALT 22 0 - 44 U/L   Alkaline Phosphatase 95 38 - 126 U/L   Total Bilirubin 3.5 (H) 0.0 - 1.2 mg/dL   GFR, Estimated 48 (L) >60 mL/min   Anion gap 12 5 - 15  Troponin I (High Sensitivity)     Status: Abnormal   Collection Time: 04/24/23  3:24 PM  Result Value Ref Range   Troponin I (High Sensitivity) 51 (H) <18 ng/L  Magnesium     Status: None   Collection Time: 04/24/23  3:24 PM  Result Value Ref Range   Magnesium 2.4 1.7 - 2.4 mg/dL  Brain natriuretic peptide     Status: Abnormal   Collection Time: 04/24/23  3:24 PM  Result Value Ref Range   B Natriuretic Peptide >4,500.0 (H) 0.0 - 100.0 pg/mL  Resp panel by RT-PCR (RSV, Flu A&B, Covid) Anterior Nasal Swab     Status: None   Collection Time: 04/24/23  4:51 PM   Specimen: Anterior Nasal Swab  Result Value Ref Range   SARS Coronavirus 2 by RT PCR NEGATIVE NEGATIVE   Influenza A by PCR NEGATIVE NEGATIVE   Influenza B by PCR NEGATIVE NEGATIVE   Resp Syncytial Virus by PCR NEGATIVE NEGATIVE  TSH     Status: None   Collection Time: 04/24/23  4:51 PM  Result Value Ref Range   TSH 3.359 0.350 - 4.500 uIU/mL  Blood gas, venous     Status: Abnormal (Preliminary result)   Collection Time: 04/24/23  5:00 PM  Result Value Ref Range   pH, Ven 7.38 7.25 - 7.43   pCO2, Ven 54 44 - 60 mmHg   pO2, Ven PENDING 32 - 45 mmHg   Bicarbonate 31.9 (H) 20.0 - 28.0 mmol/L   Acid-Base Excess 5.3 (H) 0.0 - 2.0 mmol/L   O2 Saturation 32.7 %   Patient temperature 37.0    Collection site VEIN   CBG monitoring, ED     Status: Abnormal   Collection Time: 04/24/23  5:06 PM  Result Value Ref Range   Glucose-Capillary 101 (H) 70 - 99 mg/dL  Lactic acid, plasma     Status: Abnormal   Collection Time: 04/24/23  6:46 PM  Result Value Ref Range   Lactic Acid, Venous 2.1 (HH) 0.5 - 1.9 mmol/L  Protime-INR     Status: Abnormal   Collection Time: 04/24/23  6:46 PM  Result Value Ref Range    Prothrombin Time 23.7 (H) 11.4 - 15.2 seconds   INR 2.1 (H) 0.8 - 1.2  Ethanol     Status: None   Collection Time: 04/24/23  7:00 PM  Result Value Ref Range   Alcohol, Ethyl (B) <10 <10 mg/dL  Lactic acid, plasma     Status: None   Collection Time: 04/24/23  8:31 PM  Result Value Ref Range   Lactic Acid, Venous 1.4 0.5 - 1.9 mmol/L   Recent Results (from the past 720 hours)  Resp panel by RT-PCR (RSV, Flu A&B, Covid) Anterior Nasal Swab     Status: None   Collection Time: 04/24/23  4:51 PM   Specimen: Anterior Nasal Swab  Result Value Ref Range Status   SARS Coronavirus 2 by RT PCR NEGATIVE NEGATIVE Final    Comment: (NOTE) SARS-CoV-2 target nucleic acids are NOT DETECTED.  The SARS-CoV-2 RNA is generally detectable in upper respiratory specimens during the acute phase of infection. The lowest concentration of SARS-CoV-2 viral copies this assay can detect is 138 copies/mL. A negative result does not preclude SARS-Cov-2 infection and should not be used as the sole basis for treatment or other patient management decisions. A negative result may occur with  improper specimen collection/handling, submission of specimen other than nasopharyngeal swab, presence of viral mutation(s) within the areas targeted by this assay, and inadequate number of viral copies(<138 copies/mL). A negative result must be combined with clinical observations, patient history, and epidemiological information. The expected result is Negative.  Fact Sheet for Patients:  BloggerCourse.com  Fact Sheet for Healthcare Providers:  SeriousBroker.it  This test is no t yet approved or cleared by the [place] FDA and  has been authorized for detection and/or diagnosis of SARS-CoV-2 by FDA under an Emergency Use Authorization (EUA). This EUA will remain  in effect (meaning this test can be used) for the duration of the COVID-19 declaration under Section  564(b)(1) of the Act, 21 U.S.C.section 360bbb-3(b)(1), unless the authorization is terminated  or revoked sooner.       Influenza A by PCR NEGATIVE NEGATIVE Final   Influenza B by PCR NEGATIVE NEGATIVE Final    Comment: (NOTE) The Xpert Xpress SARS-CoV-2/FLU/RSV plus assay is intended as an aid in the diagnosis of influenza from Nasopharyngeal swab specimens and should not be used as a sole basis for treatment. Nasal washings and aspirates are unacceptable for Xpert Xpress SARS-CoV-2/FLU/RSV testing.  Fact Sheet for Patients: BloggerCourse.com  Fact Sheet for Healthcare Providers: SeriousBroker.it  This test is not yet approved or cleared by the [place] FDA and has been authorized for detection and/or diagnosis of SARS-CoV-2 by FDA under an Emergency Use Authorization (EUA). This EUA will remain in effect (meaning this test can be used) for the duration of the COVID-19 declaration under Section 564(b)(1) of the Act, 21 U.S.C. section 360bbb-3(b)(1), unless the authorization is terminated or revoked.     Resp Syncytial Virus by PCR NEGATIVE NEGATIVE Final    Comment: (NOTE) Fact Sheet for Patients: BloggerCourse.com  Fact Sheet for Healthcare Providers: SeriousBroker.it  This test is not yet approved or cleared by the [place] FDA and has been authorized for detection and/or diagnosis of SARS-CoV-2 by FDA under an Emergency Use Authorization (EUA). This EUA will remain in effect (meaning this test can be used) for the duration of the COVID-19 declaration under Section 564(b)(1) of the Act, 21 U.S.C. section 360bbb-3(b)(1), unless the authorization is terminated or revoked.  Performed at [facility], [address] [place], [place] [address]    CBC:    Latest Ref Rng & Units 04/24/2023    3:24 PM 04/03/2023   11:53 AM 08/08/2021    8:22 AM  CBC   WBC 4.0 - 10.5 K/uL 5.3  6.2  8.9   Hemoglobin 13.0 - 17.0 g/dL 19.1  47.8  29.5   Hematocrit 39.0 - 52.0 % 52.8  47.3  47.7   Platelets 150 - 400 K/uL 97  121  158    Basic  Metabolic Panel: Recent Labs  Lab 04/24/23 1524  NA 136  K 4.1  CL 102  CO2 22  GLUCOSE 100*  BUN 32*  CREATININE 1.55*  CALCIUM 9.2  MG 2.4   Creatinine: Lab Results  Component Value Date   CREATININE 1.55 (H) 04/24/2023   CREATININE 1.50 (H) 04/03/2023   CREATININE 1.38 (H) 08/08/2021   Liver Function Tests:    Latest Ref Rng & Units 04/24/2023    3:24 PM 06/30/2020   12:24 PM  Hepatic Function  Total Protein 6.5 - 8.1 g/dL 7.1  8.5   Albumin 3.5 - 5.0 g/dL 2.9  4.4   AST 15 - 41 U/L 29  19   ALT 0 - 44 U/L 22  17   Alk Phosphatase 38 - 126 U/L 95  96   Total Bilirubin 0.0 - 1.2 mg/dL 3.5  1.1    Coagulation Profile: Recent Labs  Lab 04/24/23 1846  INR 2.1*   Cardiac Enzymes: No results for input(s): "CKTOTAL", "CKMB", "CKMBINDEX", "TROPONINI" in the last 168 hours. BNP (last 3 results) No results for input(s): "PROBNP" in the last 8760 hours. HbA1C: No results for input(s): "HGBA1C" in the last 72 hours. Lipid Profile: No results for input(s): "CHOL", "HDL", "LDLCALC", "TRIG", "CHOLHDL", "LDLDIRECT" in the last 72 hours.  Radiological Exams on Admission: CT Angio Chest Pulmonary Embolism (PE) W or WO Contrast Result Date: 04/24/2023 CLINICAL DATA:  Weight loss, fatigue, weakness, sepsis EXAM: CT ANGIOGRAPHY CHEST CT ABDOMEN AND PELVIS WITH CONTRAST TECHNIQUE: Multidetector CT imaging of the chest was performed using the standard protocol during bolus administration of intravenous contrast. Multiplanar CT image reconstructions and MIPs were obtained to evaluate the vascular anatomy. Multidetector CT imaging of the abdomen and pelvis was performed using the standard protocol during bolus administration of intravenous contrast. RADIATION DOSE REDUCTION: This exam was performed according to  the departmental dose-optimization program which includes automated exposure control, adjustment of the mA and/or kV according to patient size and/or use of iterative reconstruction technique. CONTRAST:  75mL OMNIPAQUE IOHEXOL 350 MG/ML SOLN COMPARISON:  04/24/2023, 06/30/2020, 07/12/2016 FINDINGS: CTA CHEST FINDINGS Cardiovascular: There is technically adequate opacification of the main pulmonary artery. Mixing artifact is identified. There is decreased contrast enhancement of the right middle and right lower lobe pulmonary arteries, which could be due to pulmonary emboli versus decreased perfusion due to presumed underlying chronic consolidation. No other filling defects are identified. The heart is enlarged, with mild left ventricular dilatation. Mural thrombus is seen within the left ventricular apex, reference image 7 of series 9 on the abdominal CT exam. There is reflux of contrast into the hepatic veins, suggesting an element of right sided cardiac dysfunction. No pericardial effusion. Normal caliber of the thoracic aorta. Evaluation of the aortic lumen is limited due to timing of the contrast bolus. Atherosclerosis of the aorta and coronary vasculature. Mediastinum/Nodes: There are numerous subcentimeter mediastinal and hilar lymph nodes, without evidence of pathologic adenopathy. Thyroid, trachea, and esophagus are grossly normal. Lungs/Pleura: There is a large right pleural effusion, volume estimated in excess of 2 L. There is rounded masslike consolidation of the right lower lobe in the infrahilar region, measuring up to 3.6 cm reference image 91/2. Neoplasm cannot be excluded. Peripheral consolidation and volume loss elsewhere within the right lower lobe consistent with atelectasis. Indeterminate 4 mm left upper lobe pulmonary nodule reference image 19/3. Trace left pleural effusion. Upper lobe predominant emphysema.  No pneumothorax. Musculoskeletal: No acute or destructive bony abnormalities.  Reconstructed  images demonstrate no additional findings. Review of the MIP images confirms the above findings. CT ABDOMEN and PELVIS FINDINGS Hepatobiliary: No focal liver abnormality is seen. No gallstones, gallbladder wall thickening, or biliary dilatation. Pancreas: Unremarkable. No pancreatic ductal dilatation or surrounding inflammatory changes. Spleen: Normal in size without focal abnormality. Adrenals/Urinary Tract: Mild bilateral renal cortical atrophy and scattered areas of renal cortical scarring, right greater than left. No urinary tract calculi or obstructive uropathy. Bladder is moderately distended, with multiple bladder diverticula consistent with chronic bladder outlet obstruction. Nonobstructing 2.1 cm bladder calculus layers dependently. The adrenals are unremarkable. Stomach/Bowel: No bowel obstruction or ileus. Distal colonic diverticulosis without diverticulitis. No bowel wall thickening or inflammatory change. Vascular/Lymphatic: There is a 5.9 cm infrarenal abdominal aortic aneurysm, with no evidence of leak or rupture. Chronic calcification is seen within the ventral aspect of the thrombosed portion of the aneurysm. Diffuse aortic atherosclerosis. No pathologic adenopathy. Reproductive: Marked enlargement of the prostate measuring 5.8 x 5.6 cm. Other: Moderate ascites, most pronounced within the bilateral flanks. No free intraperitoneal gas. No abdominal wall hernia. Musculoskeletal: Diffuse body wall edema. No acute or destructive bony abnormalities. Reconstructed images demonstrate no additional findings. Review of the MIP images confirms the above findings. IMPRESSION: Chest: 1. Decreased enhancement of the right middle and right lower lobe pulmonary arteries, without definitive filling defect identified. Differential diagnosis would include decreased perfusion due to chronic consolidation versus pulmonary embolus. 2. Rounded masslike consolidation in the right lower lobe infrahilar region  measuring up to 3.6 cm. The appearance is concerning for neoplasm, though underlying infection cannot be excluded. Pulmonology follow-up recommended. 3. Large right pleural effusion volume estimated in excess of 2 L. 4. Volume loss and peripheral consolidation within the right lower lobe, consistent with atelectasis. It is unclear whether this is postobstructive collapse or sys compressive atelectasis from the right pleural effusion. 5. Cardiomegaly, with prominent left ventricular dilatation. Small mural thrombi at the left ventricular apex best seen on the delayed contrast series on the corresponding CT abdomen exam. Echocardiography recommended. 6. Reflux of contrast into the hepatic veins consistent with cardiac dysfunction. 7.  Aortic Atherosclerosis (ICD10-I70.0). Abdomen/pelvis: 1. 5.9 cm infrarenal abdominal aortic aneurysm. No evidence of aortic leak or rupture. Recommend follow-up CT or MR as appropriate in 6 months and referral to or continued care with vascular specialist. (Ref.: J Vasc Surg. 2018; 67:2-77 and J Am Coll Radiol 2013;10(10):789-794.) 2. Moderate ascites. 3. Enlarged prostate, with evidence of chronic bladder outlet obstruction. 4. Nonobstructing 2.1 cm bladder calculus. 5. Sigmoid diverticulosis without diverticulitis. 6.  Aortic Atherosclerosis (ICD10-I70.0). 7. Diffuse body wall edema. Critical Value/emergent results were called by telephone at the time of interpretation on 04/24/2023 at 550 pm to provider [facility] , who verbally acknowledged these results. Electronically Signed   By: Sharlet Salina M.D.   On: 04/24/2023 18:02   CT ABDOMEN PELVIS W CONTRAST Result Date: 04/24/2023 CLINICAL DATA:  Weight loss, fatigue, weakness, sepsis EXAM: CT ANGIOGRAPHY CHEST CT ABDOMEN AND PELVIS WITH CONTRAST TECHNIQUE: Multidetector CT imaging of the chest was performed using the standard protocol during bolus administration of intravenous contrast. Multiplanar CT image reconstructions and  MIPs were obtained to evaluate the vascular anatomy. Multidetector CT imaging of the abdomen and pelvis was performed using the standard protocol during bolus administration of intravenous contrast. RADIATION DOSE REDUCTION: This exam was performed according to the departmental dose-optimization program which includes automated exposure control, adjustment of the mA and/or kV according to patient size and/or use of iterative  reconstruction technique. CONTRAST:  75mL OMNIPAQUE IOHEXOL 350 MG/ML SOLN COMPARISON:  04/24/2023, 06/30/2020, 07/12/2016 FINDINGS: CTA CHEST FINDINGS Cardiovascular: There is technically adequate opacification of the main pulmonary artery. Mixing artifact is identified. There is decreased contrast enhancement of the right middle and right lower lobe pulmonary arteries, which could be due to pulmonary emboli versus decreased perfusion due to presumed underlying chronic consolidation. No other filling defects are identified. The heart is enlarged, with mild left ventricular dilatation. Mural thrombus is seen within the left ventricular apex, reference image 7 of series 9 on the abdominal CT exam. There is reflux of contrast into the hepatic veins, suggesting an element of right sided cardiac dysfunction. No pericardial effusion. Normal caliber of the thoracic aorta. Evaluation of the aortic lumen is limited due to timing of the contrast bolus. Atherosclerosis of the aorta and coronary vasculature. Mediastinum/Nodes: There are numerous subcentimeter mediastinal and hilar lymph nodes, without evidence of pathologic adenopathy. Thyroid, trachea, and esophagus are grossly normal. Lungs/Pleura: There is a large right pleural effusion, volume estimated in excess of 2 L. There is rounded masslike consolidation of the right lower lobe in the infrahilar region, measuring up to 3.6 cm reference image 91/2. Neoplasm cannot be excluded. Peripheral consolidation and volume loss elsewhere within the right  lower lobe consistent with atelectasis. Indeterminate 4 mm left upper lobe pulmonary nodule reference image 19/3. Trace left pleural effusion. Upper lobe predominant emphysema.  No pneumothorax. Musculoskeletal: No acute or destructive bony abnormalities. Reconstructed images demonstrate no additional findings. Review of the MIP images confirms the above findings. CT ABDOMEN and PELVIS FINDINGS Hepatobiliary: No focal liver abnormality is seen. No gallstones, gallbladder wall thickening, or biliary dilatation. Pancreas: Unremarkable. No pancreatic ductal dilatation or surrounding inflammatory changes. Spleen: Normal in size without focal abnormality. Adrenals/Urinary Tract: Mild bilateral renal cortical atrophy and scattered areas of renal cortical scarring, right greater than left. No urinary tract calculi or obstructive uropathy. Bladder is moderately distended, with multiple bladder diverticula consistent with chronic bladder outlet obstruction. Nonobstructing 2.1 cm bladder calculus layers dependently. The adrenals are unremarkable. Stomach/Bowel: No bowel obstruction or ileus. Distal colonic diverticulosis without diverticulitis. No bowel wall thickening or inflammatory change. Vascular/Lymphatic: There is a 5.9 cm infrarenal abdominal aortic aneurysm, with no evidence of leak or rupture. Chronic calcification is seen within the ventral aspect of the thrombosed portion of the aneurysm. Diffuse aortic atherosclerosis. No pathologic adenopathy. Reproductive: Marked enlargement of the prostate measuring 5.8 x 5.6 cm. Other: Moderate ascites, most pronounced within the bilateral flanks. No free intraperitoneal gas. No abdominal wall hernia. Musculoskeletal: Diffuse body wall edema. No acute or destructive bony abnormalities. Reconstructed images demonstrate no additional findings. Review of the MIP images confirms the above findings. IMPRESSION: Chest: 1. Decreased enhancement of the right middle and right lower  lobe pulmonary arteries, without definitive filling defect identified. Differential diagnosis would include decreased perfusion due to chronic consolidation versus pulmonary embolus. 2. Rounded masslike consolidation in the right lower lobe infrahilar region measuring up to 3.6 cm. The appearance is concerning for neoplasm, though underlying infection cannot be excluded. Pulmonology follow-up recommended. 3. Large right pleural effusion volume estimated in excess of 2 L. 4. Volume loss and peripheral consolidation within the right lower lobe, consistent with atelectasis. It is unclear whether this is postobstructive collapse or sys compressive atelectasis from the right pleural effusion. 5. Cardiomegaly, with prominent left ventricular dilatation. Small mural thrombi at the left ventricular apex best seen on the delayed contrast series on the corresponding CT  abdomen exam. Echocardiography recommended. 6. Reflux of contrast into the hepatic veins consistent with cardiac dysfunction. 7.  Aortic Atherosclerosis (ICD10-I70.0). Abdomen/pelvis: 1. 5.9 cm infrarenal abdominal aortic aneurysm. No evidence of aortic leak or rupture. Recommend follow-up CT or MR as appropriate in 6 months and referral to or continued care with vascular specialist. (Ref.: J Vasc Surg. 2018; 67:2-77 and J Am Coll Radiol 2013;10(10):789-794.) 2. Moderate ascites. 3. Enlarged prostate, with evidence of chronic bladder outlet obstruction. 4. Nonobstructing 2.1 cm bladder calculus. 5. Sigmoid diverticulosis without diverticulitis. 6.  Aortic Atherosclerosis (ICD10-I70.0). 7. Diffuse body wall edema. Critical Value/emergent results were called by telephone at the time of interpretation on 04/24/2023 at 550 pm to provider [facility] , who verbally acknowledged these results. Electronically Signed   By: Sharlet Salina M.D.   On: 04/24/2023 18:02   CT Head Wo Contrast Result Date: 04/24/2023 CLINICAL DATA:  Losing weight, decreased energy for  months. EXAM: CT HEAD WITHOUT CONTRAST TECHNIQUE: Contiguous axial images were obtained from the base of the skull through the vertex without intravenous contrast. RADIATION DOSE REDUCTION: This exam was performed according to the departmental dose-optimization program which includes automated exposure control, adjustment of the mA and/or kV according to patient size and/or use of iterative reconstruction technique. COMPARISON:  CT head dated 07/24/2004. FINDINGS: Brain: No evidence of acute infarction, hemorrhage, hydrocephalus, extra-axial collection or mass lesion/mass effect. There is small to moderate volume encephalomalacia of the left frontal lobe superomedially and small-to-moderate volume encephalomalacia of the right occipital lobe. There is mild cerebral volume loss with associated ex vacuo dilatation. Periventricular Rice matter hypoattenuation likely represents chronic small vessel ischemic disease. Vascular: There are vascular calcifications in the carotid siphons. Skull: Normal. Negative for fracture or focal lesion. Sinuses/Orbits: No acute finding. Other: None. IMPRESSION: 1. No acute intracranial process. 2. Small to moderate volume encephalomalacia of the left frontal lobe and right occipital lobe. Electronically Signed   By: Romona Curls M.D.   On: 04/24/2023 17:48   DG Chest 2 View Result Date: 04/24/2023 CLINICAL DATA:  Weakness. EXAM: CHEST - 2 VIEW COMPARISON:  07/12/2016. FINDINGS: Cardiac silhouette borderline enlarged. Left coronary artery stent. No mediastinal or hilar masses. No convincing adenopathy. Moderate right and small left pleural effusions. Additional opacity at the right lung base which may reflect atelectasis or pneumonia. Vascular congestion and bilateral interstitial thickening. Skeletal structures are grossly intact. IMPRESSION: 1. Findings consistent with congestive heart failure. Moderate right and small left pleural effusions. 2. Right lung base opacity may reflect  atelectasis or pneumonia. Electronically Signed   By: Amie Portland M.D.   On: 04/24/2023 16:11    Data Reviewed: Relevant notes from primary care and specialist visits, past discharge summaries as available in EHR, including Care Everywhere. Prior diagnostic testing as pertinent to current admission diagnoses, Updated medications and problem lists for reconciliation ED course, including vitals, labs, imaging, treatment and response to treatment,Triage notes, nursing and pharmacy notes and ED provider's notes Notable results as noted in HPI.Discussed case with EDMD/ ED APP/ or Specialty MD on call and as needed.  >>Assessment and Plan: * Generalized weakness Patient initially presenting with generalized weakness and multiple complaints of weight loss not feeling well.  Based on evaluation so far Findings are concerning for possible cancer contributing to this.  We will treat the underlying illness and suspect patient will require short-term rehab or aggressive PT to get to his baseline.  Acute respiratory failure with hypoxia (HCC) New finding of hypoxia in the  emergency room patient started on 4 L of oxygen unfortunately patient found to have a large pleural effusion along with a lung mass just concerning for cancer.  Will consult pulmonology. Patient needs thoracentesis however currently is on heparin gtt. for his mural thrombus will defer to a.m. team to coordinate with pulmonology and cardiology for thoracentesis.  Supportive care with supplemental oxygenation and low threshold for NIPPV.  Low threshold for IR consult overnight report thoracentesis.  Severe sepsis [facility]) Patient does meet severe sepsis criteria with vitals and lactic acid and [place] count and findings on x-ray and CT for possible underlying pneumonia or lung mass.  Patient is started on IV antibiotic regimen with Rocephin and azithromycin which we will continue.  Continue with additional supportive measures.  Vitals have  remained stable.  IV fluid boluses not required as blood pressure is stable but also as patient is having a large pleural effusion that needs to be removed and is given Lasix x 1 in the ED.  Pleural effusion on right Etiology unclear, patient does have a history of heart disease however in light of the mass malignancy is unfortunately primary concern.  We have consulted pulmonology for evaluation with thoracentesis and evaluation and management.  Mass of right lung Patient started on IV antibiotics which we will continue.  Pulmonology consulted.  Additional evaluation plan defer to a.m. team.  LV (left ventricular) mural thrombus Case discussed with cardiology on-call and recommendations for heparin GTT.  Will obtain a 2D echocardiogram.  Cardiology consult ordered requested message sent to [facility] clinic.   Coronary artery disease involving native coronary artery of native heart with angina pectoris (HCC) Stable currently patient without any chest pain, troponin mildly elevated suspect secondary to demand.  Patient is currently on heparin gtt. for his mural thrombus.  Will continue patient on metoprolol and Imdur as blood pressure allows. Will have to verify that patient is on aspirin and Plavix which I will hold as patient is on heparin gtt.   Benign prostatic hyperplasia Patient has BPH history and is on Flomax, chart review shows a previous PSA elevation at 11.  Will repeat a PSA level and indwelling Foley.  CKD stage 3a, GFR 45-59 ml/min (HCC) Lab Results  Component Value Date   CREATININE 1.55 (H) 04/24/2023   CREATININE 1.50 (H) 04/03/2023   CREATININE 1.38 (H) 08/08/2021  Will monitor kidney function and dose medications, monitor overnight as patient has received contrast today .  Tobacco use disorder Nicotine patch.  Counseling once patient is stable.  Benign essential hypertension Vitals:   04/24/23 1515 04/24/23 1626 04/24/23 1800 04/24/23 1810  BP: (!) 180/134 (!)  182/143 (!) 196/136 (!) 159/123   04/24/23 1900 04/24/23 2000 04/24/23 2200 04/24/23 2322  BP: (!) 193/138 (!) 172/114 (!) 148/97 (!) 167/126  Will continue patient on metoprolol and Imdur and amlodipine, as needed hydralazine.   DVT prophylaxis:  Heparin GTT Consults:  Cardiology: [facility] clinic Dr. Corky Sing Pulmonology: Dr. Rosine Beat .  Advance Care Planning:    Code Status: Full Code   Family Communication:  None Disposition Plan:  Home Severity of Illness: The appropriate patient status for this patient is INPATIENT. Inpatient status is judged to be reasonable and necessary in order to provide the required intensity of service to ensure the patient's safety. The patient's presenting symptoms, physical exam findings, and initial radiographic and laboratory data in the context of their chronic comorbidities is felt to place them at high risk for further clinical deterioration. Furthermore, it  is not anticipated that the patient will be medically stable for discharge from the hospital within 2 midnights of admission.   * I certify that at the point of admission it is my clinical judgment that the patient will require inpatient hospital care spanning beyond 2 midnights from the point of admission due to high intensity of service, high risk for further deterioration and high frequency of surveillance required.*  Author: Gertha Calkin, MD 04/24/2023 11:50 PM  For on call review [email or web address].   Unresulted Labs (From admission, onward)     Start     Ordered   04/25/23 0500  Comprehensive metabolic panel  Tomorrow morning,   R        04/24/23 2039   04/25/23 0500  CBC  Tomorrow morning,   R        04/24/23 2039   04/25/23 0500  Protime-INR  Tomorrow morning,   R        04/24/23 2039   04/25/23 0100  Heparin level (unfractionated)  ONCE - URGENT,   URGENT        04/24/23 1825   04/25/23 0100  CBC  ONCE - URGENT,   STAT        04/24/23 1825   04/24/23 2100  HIV Antibody (routine  testing w rflx)  Once,   R        04/24/23 2100   04/24/23 2100  Ethanol  Once,   AD        04/24/23 2100   04/24/23 2100  T4, free  Once,   AD        04/24/23 2100   04/24/23 2100  PSA  Once,   AD        04/24/23 2100   04/24/23 2038  Urinalysis, Routine w reflex microscopic -Urine, Random  Once,   R       Question:  Specimen Source  Answer:  Urine, Random   04/24/23 2039   04/24/23 1841  Ammonia  Once,   STAT       Comments: Cannot add    04/24/23 1841   04/24/23 1818  Blood culture (routine x 2)  BLOOD CULTURE X 2,   STAT      04/24/23 1817            Orders Placed This Encounter  Procedures   Critical Care   Resp panel by RT-PCR (RSV, Flu A&B, Covid) Anterior Nasal Swab   Blood culture (routine x 2)   DG Chest 2 View   CT Head Wo Contrast   CT Angio Chest Pulmonary Embolism (PE) W or WO Contrast   CT ABDOMEN PELVIS W CONTRAST   CBC   Urinalysis, Routine w reflex microscopic -Urine, Clean Catch   Comprehensive metabolic panel   Magnesium   Brain natriuretic peptide   Blood gas, venous   TSH   Lactic acid, plasma   Protime-INR   Heparin level (unfractionated)   CBC   Ammonia   Ethanol   Comprehensive metabolic panel   CBC   Urinalysis, Routine w reflex microscopic -Urine, Random   Protime-INR   HIV Antibody (routine testing w rflx)   Ethanol   T4, free   PSA   Diet NPO time specified Except for: Ice Chips, Sips with Meds   Document Height and Actual Weight   DO NOT delay antibiotics if unable to obtain blood culture.   Maintain IV access   Vital signs   Notify physician (specify)  Mobility Protocol: No Restrictions   Refer to Sidebar Report Refer to ICU, Med-Surg, Progressive, and Step-Down Mobility Protocol Sidebars   Daily weights   Intake and Output   Do not place and if present remove PureWick   Initiate Oral Care Protocol   Initiate Carrier Fluid Protocol   RN may order General Admission PRN Orders utilizing "General Admission PRN  medications" (through manage orders) for the following patient needs: allergy symptoms (Claritin), cold sores (Carmex), cough (Robitussin DM), eye irritation (Liquifilm Tears), hemorrhoids (Tucks), indigestion (Maalox), minor skin irritation (Hydrocortisone Cream), muscle pain Romeo Apple Gay), nose irritation (saline nasal spray) and sore throat (Chloraseptic spray).   Cardiac Monitoring - Continuous Indefinite   Swallow screen   Bladder scan   Insert urethral catheter   Full code   Consult to hospitalist   Code Sepsis activation.  This occurs automatically when order is signed and prioritizes pharmacy, lab, and radiology services for STAT collections and interventions.  If [facility] downtime, call [facility] [phone]) to activate Code Sepsis.   monitoring by pharmacy   heparin per pharmacy consult   Inpatient consult to Cardiology [facility] only - Select consulting group: [facility]; Consult Timeframe: ROUTINE - requires response within 24 hours; Reason for Consult? Mural thrombus / plueral effusion.   Consult to pulmonology Consult Timeframe: ROUTINE - requires response within 24 hours; Reason for Consult? Lung mass/ large pleural effusion suspect malignancy.   Oxygen therapy Mode or (Route): Nasal cannula; Liters Per Minute: 2; Keep O2 saturation between: greater than 92 %   Pulse oximetry, continuous   CBG monitoring, ED   ED EKG   ECHOCARDIOGRAM COMPLETE   Insert 2nd peripheral IV if not already present.   Admit to Inpatient (patient's expected length of stay will be greater than 2 midnights or inpatient only procedure)   Aspiration precautions   Fall precautions

## 2023-04-25 ENCOUNTER — Inpatient Hospital Stay: Payer: Medicare Other

## 2023-04-25 ENCOUNTER — Other Ambulatory Visit: Payer: Medicare Other

## 2023-04-25 ENCOUNTER — Inpatient Hospital Stay
Admit: 2023-04-25 | Discharge: 2023-04-25 | Disposition: A | Discharge status: Home / Self Care | Payer: Medicare Other | Attending: Student | Admitting: Student

## 2023-04-25 ENCOUNTER — Encounter: Payer: Self-pay | Admitting: Internal Medicine

## 2023-04-25 DIAGNOSIS — I5031 Acute diastolic (congestive) heart failure: Secondary | ICD-10-CM | POA: Diagnosis not present

## 2023-04-25 DIAGNOSIS — N400 Enlarged prostate without lower urinary tract symptoms: Secondary | ICD-10-CM

## 2023-04-25 DIAGNOSIS — I509 Heart failure, unspecified: Secondary | ICD-10-CM

## 2023-04-25 DIAGNOSIS — F17200 Nicotine dependence, unspecified, uncomplicated: Secondary | ICD-10-CM

## 2023-04-25 DIAGNOSIS — I639 Cerebral infarction, unspecified: Secondary | ICD-10-CM | POA: Diagnosis present

## 2023-04-25 DIAGNOSIS — J90 Pleural effusion, not elsewhere classified: Secondary | ICD-10-CM

## 2023-04-25 DIAGNOSIS — I513 Intracardiac thrombosis, not elsewhere classified: Secondary | ICD-10-CM | POA: Diagnosis not present

## 2023-04-25 DIAGNOSIS — I10 Essential (primary) hypertension: Secondary | ICD-10-CM

## 2023-04-25 DIAGNOSIS — R918 Other nonspecific abnormal finding of lung field: Secondary | ICD-10-CM

## 2023-04-25 DIAGNOSIS — J9601 Acute respiratory failure with hypoxia: Secondary | ICD-10-CM

## 2023-04-25 DIAGNOSIS — N1831 Chronic kidney disease, stage 3a: Secondary | ICD-10-CM

## 2023-04-25 LAB — LIPID PANEL
Cholesterol: 127 mg/dL (ref 0–200)
HDL: 27 mg/dL — ABNORMAL LOW (ref >40)
LDL Cholesterol: 84 mg/dL (ref 0–99)
Total CHOL/HDL Ratio: 4.7 {ratio}
Triglycerides: 82 mg/dL (ref <150)
VLDL: 16 mg/dL (ref 0–40)

## 2023-04-25 LAB — ETHANOL: Alcohol, Ethyl (B): <10 mg/dL (ref <10)

## 2023-04-25 LAB — BODY FLUID CELL COUNT WITH DIFFERENTIAL
Eos, Fluid: 0 %
Lymphs, Fluid: 73 %
Monocyte-Macrophage-Serous Fluid: 17 %
Neutrophil Count, Fluid: 10 %
Total Nucleated Cell Count, Fluid: 19 uL

## 2023-04-25 LAB — CBC
HCT: 47.8 % (ref 39.0–52.0)
Hemoglobin: 15.1 g/dL (ref 13.0–17.0)
MCH: 26.1 pg (ref 26.0–34.0)
MCHC: 31.6 g/dL (ref 30.0–36.0)
MCV: 82.7 fL (ref 80.0–100.0)
Platelets: 88 10*3/uL — ABNORMAL LOW (ref 150–400)
RBC: 5.78 MIL/uL (ref 4.22–5.81)
RDW: 19.6 % — ABNORMAL HIGH (ref 11.5–15.5)
WBC: 5.1 10*3/uL (ref 4.0–10.5)
nRBC: 0 % (ref 0.0–0.2)

## 2023-04-25 LAB — PROTEIN, PLEURAL OR PERITONEAL FLUID: Total protein, fluid: <3 g/dL

## 2023-04-25 LAB — PSA: Prostatic Specific Antigen: 7.24 ng/mL — ABNORMAL HIGH (ref 0.00–4.00)

## 2023-04-25 LAB — BLOOD GAS, VENOUS
Bicarbonate: 31.9 mmol/L — ABNORMAL HIGH (ref 20.0–28.0)
O2 Saturation: 32.7 mmol/L — ABNORMAL HIGH (ref 0.0–2.0)
Patient temperature: 37
Patient temperature: 37 %
pCO2, Ven: 54 mm[Hg] (ref 44–60)
pH, Ven: 7.38 (ref 7.25–7.43)
pO2, Ven: 31.9 mmol/L — ABNORMAL HIGH (ref 32–45)

## 2023-04-25 LAB — COMPREHENSIVE METABOLIC PANEL
ALT: 22 U/L (ref 0–44)
AST: 26 U/L (ref 15–41)
Albumin: 2.6 g/dL — ABNORMAL LOW (ref 3.5–5.0)
Alkaline Phosphatase: 87 U/L (ref 38–126)
Anion gap: 13 (ref 5–15)
BUN: 29 mg/dL — ABNORMAL HIGH (ref 8–23)
CO2: 23 mmol/L (ref 22–32)
Calcium: 8.5 mg/dL — ABNORMAL LOW (ref 8.9–10.3)
Chloride: 100 mmol/L (ref 98–111)
Creatinine, Ser: 1.47 mg/dL — ABNORMAL HIGH (ref 0.61–1.24)
GFR, Estimated: 51 mL/min — ABNORMAL LOW (ref >60)
Glucose, Bld: 96 mg/dL (ref 70–99)
Potassium: 4.1 mmol/L (ref 3.5–5.1)
Sodium: 136 mmol/L (ref 135–145)
Total Bilirubin: 2.5 mg/dL — ABNORMAL HIGH (ref 0.0–1.2)
Total Protein: 6 g/dL — ABNORMAL LOW (ref 6.5–8.1)

## 2023-04-25 LAB — HEPARIN LEVEL (UNFRACTIONATED)
Heparin Unfractionated: >1.1 [IU]/mL — ABNORMAL HIGH (ref 0.30–0.70)
Heparin Unfractionated: >1.1 [IU]/mL — ABNORMAL HIGH (ref 0.30–0.70)
Heparin Unfractionated: <0.1 [IU]/mL — ABNORMAL LOW (ref 0.30–0.70)

## 2023-04-25 LAB — T4, FREE: Free T4: 1.05 ng/dL (ref 0.61–1.12)

## 2023-04-25 LAB — HIV ANTIBODY (ROUTINE TESTING W REFLEX): HIV Screen 4th Generation wRfx: NONREACTIVE

## 2023-04-25 LAB — PROTIME-INR
INR: 1.8 — ABNORMAL HIGH (ref 0.8–1.2)
Prothrombin Time: 21.4 s — ABNORMAL HIGH (ref 11.4–15.2)

## 2023-04-25 LAB — HEMOGLOBIN A1C
Hgb A1c MFr Bld: 6.7 % — ABNORMAL HIGH (ref 4.8–5.6)
Mean Plasma Glucose: 145.59 mg/dL

## 2023-04-25 LAB — LACTATE DEHYDROGENASE, PLEURAL OR PERITONEAL FLUID: LD, Fluid: 64 U/L — ABNORMAL HIGH (ref 3–23)

## 2023-04-25 MED ORDER — APIXABAN 5 MG PO TABS
10.0000 mg | ORAL_TABLET | Freq: Two times a day (BID) | ORAL | Status: DC
  Administered 2023-04-25 – 2023-04-30 (×10): 10 mg via ORAL
  Filled 2023-04-25 (×11): qty 2

## 2023-04-25 MED ORDER — ENSURE ENLIVE PO LIQD
237.0000 mL | Freq: Two times a day (BID) | ORAL | Status: DC
  Administered 2023-04-26 – 2023-04-30 (×5): 237 mL via ORAL

## 2023-04-25 MED ORDER — ASPIRIN 81 MG PO TBEC: 81.0000 mg | DELAYED_RELEASE_TABLET | Freq: Every day | ORAL | Status: DC

## 2023-04-25 MED ORDER — HEPARIN BOLUS VIA INFUSION
2000.0000 [IU] | Freq: Once | INTRAVENOUS | Status: DC
  Filled 2023-04-25: qty 2000

## 2023-04-25 MED ORDER — STROKE: EARLY STAGES OF RECOVERY BOOK
Freq: Once | Status: AC
Start: 2023-04-26 — End: 2023-04-26

## 2023-04-25 MED ORDER — APIXABAN 5 MG PO TABS: 5.0000 mg | ORAL_TABLET | Freq: Two times a day (BID) | ORAL | Status: DC

## 2023-04-25 MED ORDER — LOSARTAN POTASSIUM 25 MG PO TABS
25.0000 mg | ORAL_TABLET | Freq: Every day | ORAL | Status: DC
  Administered 2023-04-25 – 2023-04-26 (×2): 25 mg via ORAL
  Filled 2023-04-25 (×2): qty 1

## 2023-04-25 MED ORDER — LIDOCAINE HCL (PF) 1 % IJ SOLN
15.0000 mL | Freq: Once | INTRAMUSCULAR | Status: AC
  Administered 2023-04-25: 15 mL via INTRADERMAL

## 2023-04-25 MED ORDER — IOHEXOL 350 MG/ML SOLN
75.0000 mL | Freq: Once | INTRAVENOUS | Status: AC | PRN
  Administered 2023-04-25: 75 mL via INTRAVENOUS

## 2023-04-25 MED ORDER — AZITHROMYCIN 500 MG PO TABS
250.0000 mg | ORAL_TABLET | Freq: Every day | ORAL | Status: DC
  Administered 2023-04-25 – 2023-04-30 (×6): 250 mg via ORAL
  Filled 2023-04-25 (×6): qty 1

## 2023-04-25 MED ORDER — PERFLUTREN LIPID MICROSPHERE
1.0000 mL | INTRAVENOUS | Status: AC | PRN
  Administered 2023-04-25: 4 mL via INTRAVENOUS

## 2023-04-25 MED ORDER — GADOBUTROL 1 MMOL/ML IV SOLN
7.5000 mL | Freq: Once | INTRAVENOUS | Status: AC | PRN
  Administered 2023-04-25: 7.5 mL via INTRAVENOUS

## 2023-04-25 MED ORDER — HEPARIN (PORCINE) 25000 UT/250ML-% IV SOLN
1200.0000 [IU]/h | INTRAVENOUS | Status: DC
  Administered 2023-04-25: 950 [IU]/h via INTRAVENOUS

## 2023-04-25 MED ORDER — FUROSEMIDE 10 MG/ML IJ SOLN
40.0000 mg | Freq: Two times a day (BID) | INTRAMUSCULAR | Status: DC
  Administered 2023-04-25 – 2023-04-27 (×5): 40 mg via INTRAVENOUS
  Filled 2023-04-25 (×5): qty 4

## 2023-04-25 NOTE — ED Notes (Signed)
Pt back from US

## 2023-04-25 NOTE — ED Notes (Signed)
MD at bedside. 

## 2023-04-25 NOTE — Progress Notes (Signed)
Message sent to pharmacy the IV ATB is not available on the floor. It is not in his drawer, the fridge or Pyxis.

## 2023-04-25 NOTE — Assessment & Plan Note (Signed)
Echo pending.  Currently on IV Lasix.

## 2023-04-25 NOTE — ED Notes (Signed)
IVC by Dr. Royetta Crochet

## 2023-04-25 NOTE — Progress Notes (Signed)
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  Patient was brought in by a neighbor.  Patient states that he has been falling a lot.  Patient was found to have acute respiratory failure and a large pleural effusion with a mass underlying.  Patient had a Foley catheter placed.  Patient was started on IV heparin for mural thrombus seen on CT scan.  Echocardiogram pending.  1/20.  Patient states that he has been having trouble with his balance at home.  MRI of the brain showed 2 small acute infarcts in left [place] and slower absent flow in the right vertebral artery likely chronic.  Patient pulled out an IV.  Assessment and Plan: Stroke, acute, embolic (HCC) Could be embolic stroke with mural thrombus in the heart.  Will add aspirin.  Patient on heparin drip.  PT and OT consultations.  Neurology consultation.  Patient on Lipitor.  Check an LDL.  Acute CHF (congestive heart failure) (HCC) Echo pending.  Currently on IV Lasix.  LV (left ventricular) mural thrombus Heparin drip started.  Echocardiogram ordered.   Pleural effusion on right Ordered a thoracentesis with cytology since the patient has a lung mass.  Mass of right lung Thoracentesis with cytology since patient has a lung mass.  Acute respiratory failure with hypoxia [facility]) Patient with hypoxia in the emergency room of 88%.  Placed on 4 L.  Now on room air.  Benign prostatic hyperplasia Continue Flomax.  Had Foley catheter placed.  Now has blood in the catheter bilaterally secondary to heparin drip.  CKD stage 3a, GFR 45-59 ml/min (HCC) Today's creatinine 1.47 with a GFR of 51  Severe sepsis  (HCC) Ruled out  Tobacco use disorder Nicotine patch.  Counseling once patient is stable.  Benign essential hypertension Blood pressure very variable based on his mood.  Currently on Cozaar, Imdur        Subjective: Patient feeling okay.  Admitted with acute respiratory failure and falls.  Found to have acute strokes.  Also found to have a mural thrombus in the heart on CT scan.  Physical Exam: Vitals:   04/25/23 0730 04/25/23 0943 04/25/23 0945 04/25/23 1217  BP:  126/76 (!) 117/105   Pulse:  72 76   Resp:   18   Temp: 97.7 F (36.5 C)   98.6 F (37 C)  TempSrc: Oral   Oral  SpO2:   94%   Weight:      Height:       Physical Exam HENT:     Head: Normocephalic.     Mouth/Throat:     Pharynx: No oropharyngeal exudate.  Eyes:     General: Lids are normal.     Conjunctiva/sclera: Conjunctivae normal.  Cardiovascular:     Rate and Rhythm: Normal rate and regular rhythm.     Heart sounds: Normal heart sounds, S1 normal and S2 normal.  Pulmonary:     Breath sounds: Examination of the left-middle field reveals decreased breath sounds and rhonchi. Examination of the right-lower field reveals decreased breath sounds and rhonchi. Examination of the left-lower field reveals decreased breath sounds and rhonchi. Decreased breath sounds and rhonchi present.  Abdominal:  Palpations: Abdomen is soft.     Tenderness: There is no abdominal tenderness.  Musculoskeletal:     Right lower leg: Swelling present.     Left lower leg: Swelling present.  Skin:    General: Skin is warm.     Findings: No rash.  Neurological:     Mental Status: He is alert.     Comments: Answer some simple questions appropriately.  Able to straight leg raise bilaterally.     Data Reviewed: MRI showing 2 small strokes in the left abdomen.  Platelet count 88, hemoglobin 15.1, Rue blood cell count 5.1, creatinine 1.47  Family Communication: Left message for son  Disposition: Status is:  Inpatient Remains inpatient appropriate because: Acute strokes seen on MRI brain  Planned Discharge Destination: To be determined    Time spent: 38 minutes  Author: Alford Highland, MD 04/25/2023 12:24 PM  For on call review [email or web address].

## 2023-04-25 NOTE — Progress Notes (Signed)
Updated patient's son on the phone.  Son will talk to patient about being compliant with treatment.

## 2023-04-25 NOTE — ED Notes (Signed)
Pt given warm blanket and set up to eat dinner. CB within reach.

## 2023-04-25 NOTE — Hospital Course (Addendum)
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  Patient was brought in by a neighbor.  Patient states that he has been falling a lot.  Patient was found to have acute respiratory failure and a large pleural effusion with a mass underlying.  Patient had a Foley catheter placed.  Patient was started on IV heparin for mural thrombus seen on CT scan.  Echocardiogram pending.  1/20.  Patient states that he has been having trouble with his balance at home.  MRI of the brain showed 2 small acute infarcts in left [place] and slower absent flow in the right vertebral artery likely chronic.  Patient pulled out an IV.  Patient involuntarily committed.  Patient unable to give [place] a good reason on why he needs to leave today.  Needs further workup here in the hospital.  1.9 L drawn off from underneath the right lung by interventional radiology  1/21.  Cytology still pending.  EF less than 20%.  Patient on 4 L of oxygen this morning.

## 2023-04-25 NOTE — ED Notes (Signed)
Pt stated he is only doing this "for his son". Pt is calm and cooperative at this time. NAD, and set up for dinner. CB within reach.

## 2023-04-25 NOTE — Progress Notes (Signed)
PHARMACY - ANTICOAGULATION CONSULT NOTE  Pharmacy Consult for heparin Indication: pulmonary embolus  No Known Allergies  Patient Measurements: Height: 5\' 5"  (165.1 cm) Weight: 71.2 kg (157 lb) IBW/kg (Calculated) : 61.5 Heparin Dosing Weight: 71.2 kg  Vital Signs: Temp: 97 F (36.1 C) (01/19 1936) Temp Source: Axillary (01/19 1936) BP: 183/125 (01/20 0108) Pulse Rate: 86 (01/20 0108)  Labs: Recent Labs    04/24/23 1524 04/24/23 1846 04/25/23 0055  HGB 16.6  --  15.1  HCT 52.8*  --  47.8  PLT 97*  --  88*  LABPROT  --  23.7*  --   INR  --  2.1*  --   HEPARINUNFRC  --   --  >1.10*  CREATININE 1.55*  --  1.47*  TROPONINIHS 51*  --   --     Estimated Creatinine Clearance: 41.3 mL/min (A) (by C-G formula based on SCr of 1.47 mg/dL (H)).   Medical History: Past Medical History:  Diagnosis Date   Anginal pain (HCC)    Coronary artery disease    Hyperlipidemia    Hypertension    Myocardial infarction [facility])    Renal disorder    kidney stones   Assessment: 68 y/o male presenting with dizziness and decreased po intake. PMH significant for CAD, hypertension, hyperlipidemia. CT imaging concerning for pulmonary embolism (vs malignancy) and mural thrombus. Pharmacy has been consulted to initiate heparin infusion. Per chart review, patient is not on anticoagulation prior to admission.  Baseline labs: hgb 16.6, plt 97, INR ordered  Goal of Therapy:  Heparin level 0.3-0.7 units/ml Monitor platelets by anticoagulation protocol: Yes   Plan:  1/20:  HL @ 0055 = > 1.10, SUPRAtherapeutic - RN stated sample was drawn from same arm but different IV line as heparin gtt so will take this as valid - Will hold heparin gtt for 1 hr and restart @ 950 units/hr and recheck HL 6 hrs after restart.  Thank you for involving pharmacy in this patient's care.   Aaren Atallah D Clinical Pharmacist 04/25/2023 2:00 AM

## 2023-04-25 NOTE — Assessment & Plan Note (Signed)
Could be embolic stroke with mural thrombus in the heart.  Will add aspirin.  Patient on heparin drip.  PT and OT consultations.  Neurology consultation.  Patient on Lipitor.  Check an LDL.

## 2023-04-25 NOTE — Consult Note (Signed)
[facility] CLINIC CARDIOLOGY CONSULT NOTE       Patient ID: Dennis Church MRN: [id number] DOB/AGE: February 14, 1956 68 y.o.  Admit date: 04/24/2023 Referring Physician Dr. Irena Cords Primary Physician Patient, No Pcp Per  Primary Cardiologist Dr. Lady Gary (last seen 2021) Reason for Consultation LV thrombus  HPI: [facility] is a 68 y.o. male  with a past medical history of hypertension, hyperlipidemia, tobacco use, BPH who presented to the ED on 04/24/2023 for generalized weakness. Cardiology was consulted for further evaluation.   Patient states that for the last 1 to 2 weeks he has been feeling significantly tired and weak.  States that he also noticed that he would get short of breath anytime he exerted himself.  Also reports he has had multiple falls due to feeling off balance.  Given this he decided to come to the ED for further evaluation.  Workup in the ED notable for creatinine 1.55, potassium 4.1, hemoglobin 16.6, WBC 5.3.  BNP greater than 4500.  Troponin checked once and was 51.  Lactic acid found to be elevated at 2.1.  EKG with sinus rhythm rate 88 bpm.  Chest x-ray with vascular congestion and moderate right sided pleural effusion.  CTA chest revealed right lower lobe mass with associated large right pleural effusion.  LV apical thrombi also noted on the CT.  MRI brain today revealed 2 acute small infarcts in the left putamen.  At the time of my evaluation this morning the patient is resting comfortably in ED stretcher.  He denies any current complaints.  States that overall he feels much better than when he first came in.  We discussed his recent symptoms in further detail.  He states that he has been having shortness of breath on exertion associated with weakness and fatigue for the last few weeks.  Also endorses decreased appetite and weight loss.  States that he has noted lower extremity edema as well.  He denies any episodes of chest pain or palpitations.  Review of systems complete and  found to be negative unless listed above    Past Medical History:  Diagnosis Date   Anginal pain (HCC)    Coronary artery disease    Hyperlipidemia    Hypertension    Myocardial infarction [facility])    Renal disorder    kidney stones    Past Surgical History:  Procedure Laterality Date   CARDIAC CATHETERIZATION Left 04/25/2015   Procedure: Left Heart Cath and Coronary Angiography;  Surgeon: Dalia Heading, MD;  Location: [facility] INVASIVE CV LAB;  Service: Cardiovascular;  Laterality: Left;   CAROTID ENDARTERECTOMY Left    CORONARY ANGIOPLASTY WITH STENT PLACEMENT     LITHOTRIPSY      (Not in a hospital admission)  Social History   Socioeconomic History   Marital status: Single    Spouse name: Not on file   Number of children: Not on file   Years of education: Not on file   Highest education level: Not on file  Occupational History   Not on file  Tobacco Use   Smoking status: Former   Smokeless tobacco: Never  Substance and Sexual Activity   Alcohol use: No   Drug use: Not on file   Sexual activity: Not on file  Other Topics Concern   Not on file  Social History Narrative   Not on file   Social Drivers of Health   Financial Resource Strain: Not on file  Food Insecurity: Not on file  Transportation Needs:  Not on file  Physical Activity: Not on file  Stress: Not on file  Social Connections: Not on file  Intimate Partner Violence: Not on file    No family history on file.   Vitals:   04/25/23 0943 04/25/23 0945 04/25/23 1217 04/25/23 1335  BP: 126/76 (!) 117/105  (!) 146/80  Pulse: 72 76  70  Resp:  18  19  Temp:   98.6 F (37 C)   TempSrc:   Oral   SpO2:  94%  95%  Weight:      Height:        PHYSICAL EXAM General: Ill-appearing male, well nourished, in no acute distress. HEENT: Normocephalic and atraumatic. Neck: No JVD.  Lungs: Normal respiratory effort on room air. Clear bilaterally to auscultation. No wheezes, crackles, rhonchi.  Heart: HRRR. Normal  S1 and S2 without gallops or murmurs.  Abdomen: Non-distended appearing.  Msk: Normal strength and tone for age. Extremities: Warm and well perfused. No clubbing, cyanosis.  2+ pitting edema.  Neuro: Alert and oriented X 3. Psych: Answers questions appropriately.   Labs: Basic Metabolic Panel: Recent Labs    04/24/23 1524 04/25/23 0055  NA 136 136  K 4.1 4.1  CL 102 100  CO2 22 23  GLUCOSE 100* 96  BUN 32* 29*  CREATININE 1.55* 1.47*  CALCIUM 9.2 8.5*  MG 2.4  --    Liver Function Tests: Recent Labs    04/24/23 1524 04/25/23 0055  AST 29 26  ALT 22 22  ALKPHOS 95 87  BILITOT 3.5* 2.5*  PROT 7.1 6.0*  ALBUMIN 2.9* 2.6*   No results for input(s): "LIPASE", "AMYLASE" in the last 72 hours. CBC: Recent Labs    04/24/23 1524 04/25/23 0055  WBC 5.3 5.1  HGB 16.6 15.1  HCT 52.8* 47.8  MCV 82.6 82.7  PLT 97* 88*   Cardiac Enzymes: Recent Labs    04/24/23 1524  TROPONINIHS 51*   BNP: Recent Labs    04/24/23 1524  BNP >4,500.0*   D-Dimer: No results for input(s): "DDIMER" in the last 72 hours. Hemoglobin A1C: No results for input(s): "HGBA1C" in the last 72 hours. Fasting Lipid Panel: Recent Labs    04/25/23 0500  CHOL 127  HDL 27*  LDLCALC 84  TRIG 82  CHOLHDL 4.7   Thyroid Function Tests: Recent Labs    04/24/23 1651  TSH 3.359   Anemia Panel: No results for input(s): "VITAMINB12", "FOLATE", "FERRITIN", "TIBC", "IRON", "RETICCTPCT" in the last 72 hours.   Radiology: MR BRAIN W WO CONTRAST Result Date: 04/25/2023 CLINICAL DATA:  CNS neoplasm staging EXAM: MRI HEAD WITHOUT AND WITH CONTRAST TECHNIQUE: Multiplanar, multiecho pulse sequences of the brain and surrounding structures were obtained without and with intravenous contrast. CONTRAST:  7.55mL GADAVIST GADOBUTROL 1 MMOL/ML IV SOLN COMPARISON:  Head CT from yesterday FINDINGS: Brain: 2 punctate areas of restricted diffusion at the left upper putamen. Chronic infarcts scattered along the  cerebral cortex, especially left superior frontal and right occipital. Ischemic gliosis in the cerebral Kerstein matter with chronic lacunes in the bilateral thalamus. Ischemic gliosis is extensive in the cerebral Beding[place] matter. Brain atrophy is present. There are a few chronic blood products in the deep cerebellum, brainstem, left periatrial Sonnen matter, and left thalamus which are likely related to chronic small vessel disease. No abnormal enhancement Vascular: Absent right vertebral flow void just beyond the dura. Skull and upper cervical spine: Normal marrow signal Sinuses/Orbits: Negative IMPRESSION: 1. Two small acute infarcts in  the left putamen. 2. 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 3. Slow or absent flow in the right vertebral artery, likely chronic in the absence of acute infarct in this distribution. 4. No evidence of metastatic disease. Electronically Signed   By: Tiburcio Pea M.D.   On: 04/25/2023 09:17   CT Angio Chest Pulmonary Embolism (PE) W or WO Contrast Result Date: 04/24/2023 CLINICAL DATA:  Weight loss, fatigue, weakness, sepsis EXAM: CT ANGIOGRAPHY CHEST CT ABDOMEN AND PELVIS WITH CONTRAST TECHNIQUE: Multidetector CT imaging of the chest was performed using the standard protocol during bolus administration of intravenous contrast. Multiplanar CT image reconstructions and MIPs were obtained to evaluate the vascular anatomy. Multidetector CT imaging of the abdomen and pelvis was performed using the standard protocol during bolus administration of intravenous contrast. RADIATION DOSE REDUCTION: This exam was performed according to the departmental dose-optimization program which includes automated exposure control, adjustment of the mA and/or kV according to patient size and/or use of iterative reconstruction technique. CONTRAST:  75mL OMNIPAQUE IOHEXOL 350 MG/ML SOLN COMPARISON:  04/24/2023, 06/30/2020, 07/12/2016 FINDINGS: CTA CHEST FINDINGS Cardiovascular: There  is technically adequate opacification of the main pulmonary artery. Mixing artifact is identified. There is decreased contrast enhancement of the right middle and right lower lobe pulmonary arteries, which could be due to pulmonary emboli versus decreased perfusion due to presumed underlying chronic consolidation. No other filling defects are identified. The heart is enlarged, with mild left ventricular dilatation. Mural thrombus is seen within the left ventricular apex, reference image 7 of series 9 on the abdominal CT exam. There is reflux of contrast into the hepatic veins, suggesting an element of right sided cardiac dysfunction. No pericardial effusion. Normal caliber of the thoracic aorta. Evaluation of the aortic lumen is limited due to timing of the contrast bolus. Atherosclerosis of the aorta and coronary vasculature. Mediastinum/Nodes: There are numerous subcentimeter mediastinal and hilar lymph nodes, without evidence of pathologic adenopathy. Thyroid, trachea, and esophagus are grossly normal. Lungs/Pleura: There is a large right pleural effusion, volume estimated in excess of 2 L. There is rounded masslike consolidation of the right lower lobe in the infrahilar region, measuring up to 3.6 cm reference image 91/2. Neoplasm cannot be excluded. Peripheral consolidation and volume loss elsewhere within the right lower lobe consistent with atelectasis. Indeterminate 4 mm left upper lobe pulmonary nodule reference image 19/3. Trace left pleural effusion. Upper lobe predominant emphysema.  No pneumothorax. Musculoskeletal: No acute or destructive bony abnormalities. Reconstructed images demonstrate no additional findings. Review of the MIP images confirms the above findings. CT ABDOMEN and PELVIS FINDINGS Hepatobiliary: No focal liver abnormality is seen. No gallstones, gallbladder wall thickening, or biliary dilatation. Pancreas: Unremarkable. No pancreatic ductal dilatation or surrounding inflammatory  changes. Spleen: Normal in size without focal abnormality. Adrenals/Urinary Tract: Mild bilateral renal cortical atrophy and scattered areas of renal cortical scarring, right greater than left. No urinary tract calculi or obstructive uropathy. Bladder is moderately distended, with multiple bladder diverticula consistent with chronic bladder outlet obstruction. Nonobstructing 2.1 cm bladder calculus layers dependently. The adrenals are unremarkable. Stomach/Bowel: No bowel obstruction or ileus. Distal colonic diverticulosis without diverticulitis. No bowel wall thickening or inflammatory change. Vascular/Lymphatic: There is a 5.9 cm infrarenal abdominal aortic aneurysm, with no evidence of leak or rupture. Chronic calcification is seen within the ventral aspect of the thrombosed portion of the aneurysm. Diffuse aortic atherosclerosis. No pathologic adenopathy. Reproductive: Marked enlargement of the prostate measuring 5.8 x 5.6 cm. Other: Moderate ascites, most pronounced within  the bilateral flanks. No free intraperitoneal gas. No abdominal wall hernia. Musculoskeletal: Diffuse body wall edema. No acute or destructive bony abnormalities. Reconstructed images demonstrate no additional findings. Review of the MIP images confirms the above findings. IMPRESSION: Chest: 1. Decreased enhancement of the right middle and right lower lobe pulmonary arteries, without definitive filling defect identified. Differential diagnosis would include decreased perfusion due to chronic consolidation versus pulmonary embolus. 2. Rounded masslike consolidation in the right lower lobe infrahilar region measuring up to 3.6 cm. The appearance is concerning for neoplasm, though underlying infection cannot be excluded. Pulmonology follow-up recommended. 3. Large right pleural effusion volume estimated in excess of 2 L. 4. Volume loss and peripheral consolidation within the right lower lobe, consistent with atelectasis. It is unclear whether  this is postobstructive collapse or sys compressive atelectasis from the right pleural effusion. 5. Cardiomegaly, with prominent left ventricular dilatation. Small mural thrombi at the left ventricular apex best seen on the delayed contrast series on the corresponding CT abdomen exam. Echocardiography recommended. 6. Reflux of contrast into the hepatic veins consistent with cardiac dysfunction. 7.  Aortic Atherosclerosis (ICD10-I70.0). Abdomen/pelvis: 1. 5.9 cm infrarenal abdominal aortic aneurysm. No evidence of aortic leak or rupture. Recommend follow-up CT or MR as appropriate in 6 months and referral to or continued care with vascular specialist. (Ref.: J Vasc Surg. 2018; 67:2-77 and J Am Coll Radiol 2013;10(10):789-794.) 2. Moderate ascites. 3. Enlarged prostate, with evidence of chronic bladder outlet obstruction. 4. Nonobstructing 2.1 cm bladder calculus. 5. Sigmoid diverticulosis without diverticulitis. 6.  Aortic Atherosclerosis (ICD10-I70.0). 7. Diffuse body wall edema. Critical Value/emergent results were called by telephone at the time of interpretation on 04/24/2023 at 550 pm to provider [facility] , who verbally acknowledged these results. Electronically Signed   By: Sharlet Salina M.D.   On: 04/24/2023 18:02   CT ABDOMEN PELVIS W CONTRAST Result Date: 04/24/2023 CLINICAL DATA:  Weight loss, fatigue, weakness, sepsis EXAM: CT ANGIOGRAPHY CHEST CT ABDOMEN AND PELVIS WITH CONTRAST TECHNIQUE: Multidetector CT imaging of the chest was performed using the standard protocol during bolus administration of intravenous contrast. Multiplanar CT image reconstructions and MIPs were obtained to evaluate the vascular anatomy. Multidetector CT imaging of the abdomen and pelvis was performed using the standard protocol during bolus administration of intravenous contrast. RADIATION DOSE REDUCTION: This exam was performed according to the departmental dose-optimization program which includes automated exposure  control, adjustment of the mA and/or kV according to patient size and/or use of iterative reconstruction technique. CONTRAST:  75mL OMNIPAQUE IOHEXOL 350 MG/ML SOLN COMPARISON:  04/24/2023, 06/30/2020, 07/12/2016 FINDINGS: CTA CHEST FINDINGS Cardiovascular: There is technically adequate opacification of the main pulmonary artery. Mixing artifact is identified. There is decreased contrast enhancement of the right middle and right lower lobe pulmonary arteries, which could be due to pulmonary emboli versus decreased perfusion due to presumed underlying chronic consolidation. No other filling defects are identified. The heart is enlarged, with mild left ventricular dilatation. Mural thrombus is seen within the left ventricular apex, reference image 7 of series 9 on the abdominal CT exam. There is reflux of contrast into the hepatic veins, suggesting an element of right sided cardiac dysfunction. No pericardial effusion. Normal caliber of the thoracic aorta. Evaluation of the aortic lumen is limited due to timing of the contrast bolus. Atherosclerosis of the aorta and coronary vasculature. Mediastinum/Nodes: There are numerous subcentimeter mediastinal and hilar lymph nodes, without evidence of pathologic adenopathy. Thyroid, trachea, and esophagus are grossly normal. Lungs/Pleura: There is a large  right pleural effusion, volume estimated in excess of 2 L. There is rounded masslike consolidation of the right lower lobe in the infrahilar region, measuring up to 3.6 cm reference image 91/2. Neoplasm cannot be excluded. Peripheral consolidation and volume loss elsewhere within the right lower lobe consistent with atelectasis. Indeterminate 4 mm left upper lobe pulmonary nodule reference image 19/3. Trace left pleural effusion. Upper lobe predominant emphysema.  No pneumothorax. Musculoskeletal: No acute or destructive bony abnormalities. Reconstructed images demonstrate no additional findings. Review of the MIP images  confirms the above findings. CT ABDOMEN and PELVIS FINDINGS Hepatobiliary: No focal liver abnormality is seen. No gallstones, gallbladder wall thickening, or biliary dilatation. Pancreas: Unremarkable. No pancreatic ductal dilatation or surrounding inflammatory changes. Spleen: Normal in size without focal abnormality. Adrenals/Urinary Tract: Mild bilateral renal cortical atrophy and scattered areas of renal cortical scarring, right greater than left. No urinary tract calculi or obstructive uropathy. Bladder is moderately distended, with multiple bladder diverticula consistent with chronic bladder outlet obstruction. Nonobstructing 2.1 cm bladder calculus layers dependently. The adrenals are unremarkable. Stomach/Bowel: No bowel obstruction or ileus. Distal colonic diverticulosis without diverticulitis. No bowel wall thickening or inflammatory change. Vascular/Lymphatic: There is a 5.9 cm infrarenal abdominal aortic aneurysm, with no evidence of leak or rupture. Chronic calcification is seen within the ventral aspect of the thrombosed portion of the aneurysm. Diffuse aortic atherosclerosis. No pathologic adenopathy. Reproductive: Marked enlargement of the prostate measuring 5.8 x 5.6 cm. Other: Moderate ascites, most pronounced within the bilateral flanks. No free intraperitoneal gas. No abdominal wall hernia. Musculoskeletal: Diffuse body wall edema. No acute or destructive bony abnormalities. Reconstructed images demonstrate no additional findings. Review of the MIP images confirms the above findings. IMPRESSION: Chest: 1. Decreased enhancement of the right middle and right lower lobe pulmonary arteries, without definitive filling defect identified. Differential diagnosis would include decreased perfusion due to chronic consolidation versus pulmonary embolus. 2. Rounded masslike consolidation in the right lower lobe infrahilar region measuring up to 3.6 cm. The appearance is concerning for neoplasm, though  underlying infection cannot be excluded. Pulmonology follow-up recommended. 3. Large right pleural effusion volume estimated in excess of 2 L. 4. Volume loss and peripheral consolidation within the right lower lobe, consistent with atelectasis. It is unclear whether this is postobstructive collapse or sys compressive atelectasis from the right pleural effusion. 5. Cardiomegaly, with prominent left ventricular dilatation. Small mural thrombi at the left ventricular apex best seen on the delayed contrast series on the corresponding CT abdomen exam. Echocardiography recommended. 6. Reflux of contrast into the hepatic veins consistent with cardiac dysfunction. 7.  Aortic Atherosclerosis (ICD10-I70.0). Abdomen/pelvis: 1. 5.9 cm infrarenal abdominal aortic aneurysm. No evidence of aortic leak or rupture. Recommend follow-up CT or MR as appropriate in 6 months and referral to or continued care with vascular specialist. (Ref.: J Vasc Surg. 2018; 67:2-77 and J Am Coll Radiol 2013;10(10):789-794.) 2. Moderate ascites. 3. Enlarged prostate, with evidence of chronic bladder outlet obstruction. 4. Nonobstructing 2.1 cm bladder calculus. 5. Sigmoid diverticulosis without diverticulitis. 6.  Aortic Atherosclerosis (ICD10-I70.0). 7. Diffuse body wall edema. Critical Value/emergent results were called by telephone at the time of interpretation on 04/24/2023 at 550 pm to provider [facility] , who verbally acknowledged these results. Electronically Signed   By: Sharlet Salina M.D.   On: 04/24/2023 18:02   CT Head Wo Contrast Result Date: 04/24/2023 CLINICAL DATA:  Losing weight, decreased energy for months. EXAM: CT HEAD WITHOUT CONTRAST TECHNIQUE: Contiguous axial images were obtained from the base  of the skull through the vertex without intravenous contrast. RADIATION DOSE REDUCTION: This exam was performed according to the departmental dose-optimization program which includes automated exposure control, adjustment of the mA  and/or kV according to patient size and/or use of iterative reconstruction technique. COMPARISON:  CT head dated 07/24/2004. FINDINGS: Brain: No evidence of acute infarction, hemorrhage, hydrocephalus, extra-axial collection or mass lesion/mass effect. There is small to moderate volume encephalomalacia of the left frontal lobe superomedially and small-to-moderate volume encephalomalacia of the right occipital lobe. There is mild cerebral volume loss with associated ex vacuo dilatation. Periventricular Ovando matter hypoattenuation likely represents chronic small vessel ischemic disease. Vascular: There are vascular calcifications in the carotid siphons. Skull: Normal. Negative for fracture or focal lesion. Sinuses/Orbits: No acute finding. Other: None. IMPRESSION: 1. No acute intracranial process. 2. Small to moderate volume encephalomalacia of the left frontal lobe and right occipital lobe. Electronically Signed   By: Romona Curls M.D.   On: 04/24/2023 17:48   DG Chest 2 View Result Date: 04/24/2023 CLINICAL DATA:  Weakness. EXAM: CHEST - 2 VIEW COMPARISON:  07/12/2016. FINDINGS: Cardiac silhouette borderline enlarged. Left coronary artery stent. No mediastinal or hilar masses. No convincing adenopathy. Moderate right and small left pleural effusions. Additional opacity at the right lung base which may reflect atelectasis or pneumonia. Vascular congestion and bilateral interstitial thickening. Skeletal structures are grossly intact. IMPRESSION: 1. Findings consistent with congestive heart failure. Moderate right and small left pleural effusions. 2. Right lung base opacity may reflect atelectasis or pneumonia. Electronically Signed   By: Amie Portland M.D.   On: 04/24/2023 16:11    ECHO pending  TELEMETRY reviewed by me 04/25/2023: Sinus rhythm PVCs rate 70s  EKG reviewed by me: Sinus rhythm rate 88 bpm  Data reviewed by me 04/25/2023: last 24h vitals tele labs imaging I/O ED provider note, admission  H&P  Active Problems:   Benign prostatic hyperplasia   Benign essential hypertension   Tobacco use disorder   Coronary artery disease involving native coronary artery of native heart with angina pectoris (HCC)   Acute respiratory failure with hypoxia (HCC)   CKD stage 3a, GFR 45-59 ml/min (HCC)   Severe sepsis (HCC)   Pleural effusion on right   Mass of right lung   LV (left ventricular) mural thrombus   Stroke, acute, embolic (HCC)   Acute CHF (congestive heart failure) (HCC)    ASSESSMENT AND PLAN:  Dennis Church is a 68 y.o. male  with a past medical history of hypertension, hyperlipidemia, tobacco use, BPH who presented to the ED on 04/24/2023 for generalized weakness. Cardiology was consulted for further evaluation.   # Acute CVA # LV apical thrombus # LE edema # Hypertension Patient initially presented for generalized weakness found to have LV apical thrombus on CTA chest.  Also with significant lower extremity edema and dyspnea on exertion for the last few weeks.  BNP elevated greater than 4500.  MRI head today with 2 acute infarcts. -Continue IV heparin.  Continue aspirin 81 mg daily.  Primary team to consult neurology. -Echo pending.  Suspect reduced EF given LV apical thrombus and heart failure symptoms. -Continue IV Lasix 40 mg twice daily. -Start losartan 25 mg daily.  Continue Imdur 60 mg daily.  Consider further additions to GDMT pending BP and renal function. -Continue to monitor renal function closely with diuresis.  # RLL lung mass # Large R pleural effusion Patient with right lower lobe lung mass noted on CTA chest as well  as large right pleural effusion.  Concern for malignancy. -Thoracentesis planned for today. -Further management per primary team.   This patient's plan of care was discussed and created with Dr. Melton Alar and she is in agreement.  Signed: Gale Journey, PA-C  04/25/2023, 2:30 PM [facility]

## 2023-04-25 NOTE — Progress Notes (Signed)
PHARMACY - ANTICOAGULATION CONSULT NOTE  Pharmacy Consult for heparin Indication: pulmonary embolus  No Known Allergies  Patient Measurements: Height: 5\' 5"  (165.1 cm) Weight: 71.2 kg (157 lb) IBW/kg (Calculated) : 61.5 Heparin Dosing Weight: 71.2 kg  Vital Signs: Temp: 98.6 F (37 C) (01/20 1217) Temp Source: Oral (01/20 1217) BP: 146/80 (01/20 1335) Pulse Rate: 70 (01/20 1335)  Labs: Recent Labs    04/24/23 1524 04/24/23 1846 04/25/23 0055 04/25/23 1004 04/25/23 1231  HGB 16.6  --  15.1  --   --   HCT 52.8*  --  47.8  --   --   PLT 97*  --  88*  --   --   LABPROT  --  23.7* 21.4*  --   --   INR  --  2.1* 1.8*  --   --   HEPARINUNFRC  --   --  >1.10* >1.10* <0.10*  CREATININE 1.55*  --  1.47*  --   --   TROPONINIHS 51*  --   --   --   --    Estimated Creatinine Clearance: 41.3 mL/min (A) (by C-G formula based on SCr of 1.47 mg/dL (H)).  Medical History: Past Medical History:  Diagnosis Date   Anginal pain (HCC)    Coronary artery disease    Hyperlipidemia    Hypertension    Myocardial infarction [facility])    Renal disorder    kidney stones   Assessment: 68 y/o male presenting with dizziness and decreased po intake. PMH significant for CAD, hypertension, hyperlipidemia. CT imaging concerning for pulmonary embolism (vs malignancy) and mural thrombus. Pharmacy has been consulted to initiate heparin infusion. Per chart review, patient is not on anticoagulation prior to admission.  Baseline labs: hgb 16.6, plt 97, INR ordered  Goal of Therapy:  Heparin level 0.3-0.7 units/ml Monitor platelets by anticoagulation protocol: Yes   Date Time Results Comments  1/19 1825  Heparin infusion started @ 1200 units/hr  1/20 0055 HL > 1.10 Infusion rate decreased 1200 ->950 units/hr  1/20 1004 HL > 1.10 HOLD infusion and repeat level STAT  1/20 1231 HL < 0.10 Bolus, then resume heparin infusion at 1200 un/hr             Plan:  Repeat HL undetectable (draw from arm  opposite to heparin infusion). Will bolus heparin at 2000 units IV x 1,then resume heparin infusion at 1200 units/hr. Repeat HL 6hrs after infusion re-start. CBC daily while on heparin infusion.  Hassen Bruun Rodriguez-Guzman PharmD, BCPS 04/25/2023 1:51 PM

## 2023-04-25 NOTE — Progress Notes (Signed)
*  PRELIMINARY RESULTS* Echocardiogram 2D Echocardiogram has been performed.  Dennis Church 04/25/2023, 3:51 PM

## 2023-04-25 NOTE — ED Notes (Signed)
This RN and tech removed foley per pt request. Pt tolerated well and was advised not to get up unless nurse is in room due to fall risk. NAD, CB within reach and urinal given

## 2023-04-25 NOTE — ED Notes (Addendum)
Echo at bedside

## 2023-04-25 NOTE — ED Notes (Signed)
Pt to MRI

## 2023-04-25 NOTE — ED Notes (Signed)
Pt attempting to leave, stating he wants to leave.  Pt removed all leads and o2. Pt stated he didn't want oxygen and he's "not staying here". Pt was convinced to stay for his ECHO and he agreed to stay. This RN assisted pt back into bed. NAD. O2 replaced on pt.

## 2023-04-25 NOTE — Consult Note (Addendum)
NEUROLOGY CONSULT NOTE   Date of service: April 25, 2023 Patient Name: Dennis Church MRN:  [id number] DOB:  [birth year] Chief Complaint: Weakness Requesting Provider: Alford Highland, MD  History of Present Illness  Dennis Church is a 68 y.o. male  Who has a past medical history of angina, coronary artery disease, hypertension, hyperlipidemia, tobacco abuse, presented for evaluation of generalized weakness and gait instability while walking for the past few weeks to months.  He is a very poor historian.  Reports that he is dizzy every time he attempts to walk and that is the primary reason for him to come to the hospital.  On presentation he was hypoxic on room air and chest imaging showed PE and cardiomegaly along with mural apical thrombus.  It also revealed large pleural effusion on the right with right lower lobe mass of 3.6 cm concerning for neoplasm.  He was started on a heparin drip and brain imaging was obtained due to history of confusion and weakness and revealed 2 punctate infarcts in the left putamen, for which neurological consultation was obtained. He continues to complain of weakness and dizziness but denies any focal neurological symptoms  Patient also has diminished appetite and significant weight loss.  LKW: Unclear Modified rankin score: Unable to reliably ascertain IV Thrombolysis: Outside the window due to unclear last known well EVT: Outside the window due to unclear last known well   NIHSS components Score: Comment  1a Level of Conscious 0[x]  1[]  2[]  3[]      1b LOC Questions 0[]  1[]  2[x]       1c LOC Commands 0[x]  1[]  2[]       2 Best Gaze 0[x]  1[]  2[]       3 Visual 0[x]  1[]  2[]  3[]      4 Facial Palsy 0[]  1[x]  2[]  3[]      5a Motor Arm - left 0[x]  1[]  2[]  3[]  4[]  UN[]    5b Motor Arm - Right 0[x]  1[]  2[]  3[]  4[]  UN[]    6a Motor Leg - Left 0[x]  1[]  2[]  3[]  4[]  UN[]    6b Motor Leg - Right 0[x]  1[]  2[]  3[]  4[]  UN[]    7 Limb Ataxia 0[x]  1[]  2[]  3[]  UN[]     8 Sensory  0[x]  1[]  2[]  UN[]      9 Best Language 0[x]  1[]  2[]  3[]      10 Dysarthria 0[x]  1[]  2[]  UN[]      11 Extinct. and Inattention 0[x]  1[]  2[]       TOTAL: 3      ROS  Unable to rapidly ascertain due to his altered mentation  Past History   Past Medical History:  Diagnosis Date   Anginal pain (HCC)    Coronary artery disease    Hyperlipidemia    Hypertension    Myocardial infarction [facility])    Renal disorder    kidney stones    Past Surgical History:  Procedure Laterality Date   CARDIAC CATHETERIZATION Left 04/25/2015   Procedure: Left Heart Cath and Coronary Angiography;  Surgeon: Dalia Heading, MD;  Location: [facility] INVASIVE CV LAB;  Service: Cardiovascular;  Laterality: Left;   CAROTID ENDARTERECTOMY Left    CORONARY ANGIOPLASTY WITH STENT PLACEMENT     LITHOTRIPSY      Family History: No family history on file.  Social History  reports that he has quit smoking. He has never used smokeless tobacco. He reports that he does not drink alcohol. No history on file for drug use.  No Known Allergies  Medications   Current Facility-Administered  Medications:    [START ON 04/26/2023]  stroke: early stages of recovery book, , Does not apply, Once, Wieting, Richard, MD   acetaminophen (TYLENOL) tablet 650 mg, 650 mg, Oral, Q6H PRN **OR** acetaminophen (TYLENOL) suppository 650 mg, 650 mg, Rectal, Q6H PRN, Gertha Calkin, MD   atorvastatin (LIPITOR) tablet 80 mg, 80 mg, Oral, QPM, Allena Katz, Eliezer Mccoy, MD   azithromycin (ZITHROMAX) tablet 250 mg, 250 mg, Oral, Daily, Wieting, Richard, MD   cefTRIAXone (ROCEPHIN) 2 g in sodium chloride 0.9 % 100 mL IVPB, 2 g, Intravenous, Q24H, Gertha Calkin, MD, Stopped at 04/24/23 2207   furosemide (LASIX) injection 40 mg, 40 mg, Intravenous, BID, Hudson, Caralyn, PA-C   heparin ADULT infusion 100 units/mL (25000 units/278mL), 950 Units/hr, Intravenous, Continuous, Gertha Calkin, MD, Paused at 04/25/23 1212   hydrALAZINE (APRESOLINE) injection 10 mg, 10 mg,  Intravenous, Q6H PRN, Gertha Calkin, MD, 10 mg at 04/25/23 0319   isosorbide mononitrate (IMDUR) 24 hr tablet 60 mg, 60 mg, Oral, Daily, Irena Cords V, MD, 60 mg at 04/25/23 0943   losartan (COZAAR) tablet 25 mg, 25 mg, Oral, Daily, Hudson, Caralyn, PA-C, 25 mg at 04/25/23 1057   morphine (PF) 2 MG/ML injection 2 mg, 2 mg, Intravenous, Q4H PRN, Allena Katz, Ekta V, MD   sodium chloride flush (NS) 0.9 % injection 3 mL, 3 mL, Intravenous, Q12H, Irena Cords V, MD, 3 mL at 04/25/23 0945   tamsulosin (FLOMAX) capsule 0.4 mg, 0.4 mg, Oral, Daily, Irena Cords V, MD, 0.4 mg at 04/25/23 6045  Current Outpatient Medications:    amLODipine (NORVASC) 5 MG tablet, Take 5 mg by mouth daily., Disp: , Rfl:    aspirin EC 81 MG tablet, Take 81 mg by mouth daily., Disp: , Rfl:    atorvastatin (LIPITOR) 80 MG tablet, Take 80 mg by mouth every evening., Disp: , Rfl:    clopidogrel (PLAVIX) 75 MG tablet, Take 75 mg by mouth daily., Disp: , Rfl:    isosorbide mononitrate (IMDUR) 60 MG 24 hr tablet, Take 60 mg by mouth daily., Disp: , Rfl:    metoprolol tartrate (LOPRESSOR) 25 MG tablet, Take 25 mg by mouth 2 (two) times daily., Disp: , Rfl:    nitroGLYCERIN (NITROSTAT) 0.4 MG SL tablet, Place 0.4 mg under the tongue every 5 (five) minutes x 3 doses as needed., Disp: , Rfl:    tamsulosin (FLOMAX) 0.4 MG CAPS capsule, Take 1 capsule (0.4 mg total) by mouth daily., Disp: 30 capsule, Rfl: 1  Vitals   Vitals:   04/25/23 0730 04/25/23 0943 04/25/23 0945 04/25/23 1217  BP:  126/76 (!) 117/105   Pulse:  72 76   Resp:   18   Temp: 97.7 F (36.5 C)   98.6 F (37 C)  TempSrc: Oral   Oral  SpO2:   94%   Weight:      Height:        Body mass index is 26.13 kg/m.  Physical Exam  General: Awake alert, pulling at his lines, looks very disheveled HEENT: Normocephalic atraumatic Lungs: Diminished breath sounds on the right in comparison to the left Cardiovascular: Regular rhythm Neurological exam Awake alert oriented to  self and the fact that he is in the hospital.  Told me his age to be 78 which he is going to be in about 10 days, but could not tell me the month or the year. Naming intact, comprehension intact, repetition intact. Diminished attention concentration Cranial nerves: Pupils equal round react  light, extraocular movements intact, visual fields appear full, question subtle right nasolabial fold drooping in comparison to the left-has a lot of facial hair that obscures a good exam. No drift Sensation intact Coordination intact  Labs/Imaging/Neurodiagnostic studies   CBC:  Recent Labs  Lab 11-May-2023 1524 04/25/23 0055  WBC 5.3 5.1  HGB 16.6 15.1  HCT 52.8* 47.8  MCV 82.6 82.7  PLT 97* 88*   Basic Metabolic Panel:  Lab Results  Component Value Date   NA 136 04/25/2023   K 4.1 04/25/2023   CO2 23 04/25/2023   GLUCOSE 96 04/25/2023   BUN 29 (H) 04/25/2023   CREATININE 1.47 (H) 04/25/2023   CALCIUM 8.5 (L) 04/25/2023   GFRNONAA 51 (L) 04/25/2023   GFRAA >60 07/12/2016   Lipid Panel: No results found for: "LDLCALC" HgbA1c: No results found for: "HGBA1C" Urine Drug Screen: No results found for: "LABOPIA", "COCAINSCRNUR", "LABBENZ", "AMPHETMU", "THCU", "LABBARB"  Alcohol Level     Component Value Date/Time   ETH <10 04/25/2023 0313   INR  Lab Results  Component Value Date   INR 1.8 (H) 04/25/2023   CT Head without contrast(Personally reviewed): No acute intracranial process  CT angio Head and Neck with contrast(Personally reviewed): Ordered and pending  MRI Brain with and without contrast (Personally reviewed): 2 small punctate infarcts in the left putamen.  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  Slower absent flow in the right vertebral artery likely chronic in the absence of given infarct in this distribution.  No evidence of metastatic disease.  ASSESSMENT   Mr. Kalmbach is a 68 year old man who has a past history of coronary artery disease,  hypertension, hyperlipidemia, tobacco abuse who presented for evaluation of generalized weakness gait instability and dizziness going on for past few weeks to months-she is a very poor historian. His medical workup revealed a right lung mass, pleural effusion and pulmonary emboli along with a LV thrombus for which she was started on a heparin drip.  MRI of the brain with and without contrast was performed which showed no evidence of metastatic disease but did reveal small punctate acute infarcts in the left putamen. Stroke etiology is likely small vessel disease versus hypercoagulability. Discovery of the stroke I think though is incidental, as he did not have any focal symptoms but on exam does have mild right facial weakness.   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 PT/OT/speech therapy No need for permissive hypertension.  Goal blood pressure is normotension.  Avoid hypotension. Management of pleural effusion, lung mass and LV thrombus per primary team.  Will follow Plan d/w  Dr Renae Gloss   _____________________________________________________________________    Signed, Milon Dikes, MD [facility]

## 2023-04-25 NOTE — ED Notes (Signed)
Pt removed second IV upon returning to room with warm blankets by this RN. NAD. Neurologist at bedside

## 2023-04-25 NOTE — Progress Notes (Signed)
OT Cancellation Note  Patient Details Name: MOMODOU FRACASSO MRN: [id number] DOB: [birth year]   Cancelled Treatment:    Reason Eval/Treat Not Completed: Patient not medically ready. Orders received, chart reviewed. Pt started on heparin drip 1/19 at 1854; pending neurology consult, CT angio and thoracentesis. OT will hold eval until workup complete.   Chey Rachels L. Arless Vineyard, OTR/L  04/25/23, 12:51 PM

## 2023-04-25 NOTE — ED Notes (Signed)
[phone] [email or web address]

## 2023-04-25 NOTE — Progress Notes (Signed)
PHARMACY - ANTICOAGULATION CONSULT NOTE  Pharmacy Consult for heparin Indication: pulmonary embolus  No Known Allergies  Patient Measurements: Height: 5\' 5"  (165.1 cm) Weight: 71.2 kg (157 lb) IBW/kg (Calculated) : 61.5 Heparin Dosing Weight: 71.2 kg  Vital Signs: Temp: 97.7 F (36.5 C) (01/20 0730) Temp Source: Oral (01/20 0730) BP: 117/105 (01/20 0945) Pulse Rate: 76 (01/20 0945)  Labs: Recent Labs    04/24/23 1524 04/24/23 1846 04/25/23 0055 04/25/23 1004  HGB 16.6  --  15.1  --   HCT 52.8*  --  47.8  --   PLT 97*  --  88*  --   LABPROT  --  23.7* 21.4*  --   INR  --  2.1* 1.8*  --   HEPARINUNFRC  --   --  >1.10* >1.10*  CREATININE 1.55*  --  1.47*  --   TROPONINIHS 51*  --   --   --    Estimated Creatinine Clearance: 41.3 mL/min (A) (by C-G formula based on SCr of 1.47 mg/dL (H)).  Medical History: Past Medical History:  Diagnosis Date   Anginal pain (HCC)    Coronary artery disease    Hyperlipidemia    Hypertension    Myocardial infarction [facility])    Renal disorder    kidney stones   Assessment: 68 y/o male presenting with dizziness and decreased po intake. PMH significant for CAD, hypertension, hyperlipidemia. CT imaging concerning for pulmonary embolism (vs malignancy) and mural thrombus. Pharmacy has been consulted to initiate heparin infusion. Per chart review, patient is not on anticoagulation prior to admission.  Baseline labs: hgb 16.6, plt 97, INR ordered  Goal of Therapy:  Heparin level 0.3-0.7 units/ml Monitor platelets by anticoagulation protocol: Yes   Date Time Results Comments  1/19 1825  Heparin infusion started @ 1200 units/hr  1/20 0055 HL > 1.10 Infusion rate decreased 1200 ->950 units/hr  1/20 1004 HL > 1.10 HOLD infusion and repeat level STAT        Plan:  2nd time SUPRA-therapeutic HL. Draw done on the same site as heparin infusion. RN will HOLD infusion for now, STAT repeat HL ordered. Plan to dose once new results  available  Navin Dogan Rodriguez-Guzman PharmD, BCPS 04/25/2023 12:30 PM

## 2023-04-25 NOTE — ED Notes (Signed)
IVC/psych Consult ordered/Medical Admit/ All Legal Paper work scanned & E-filed into chart

## 2023-04-25 NOTE — ED Notes (Signed)
Pt to [place] with security. Pt is cooperative at this time

## 2023-04-25 NOTE — Procedures (Signed)
PROCEDURE SUMMARY:  Successful US guided right sided thoracentesis. Yielded 1.9L of clear, yellow fluid. Pt tolerated procedure well. No immediate complications.  Specimen was sent for labs. CXR ordered. No evidence of pneumothorax.  EBL < 5 mL  Yusif Gnau N Ledarius Leeson PA-C 04/25/2023 4:10 PM

## 2023-04-25 NOTE — ED Notes (Signed)
Scrub bottoms provided by this RN per request.

## 2023-04-25 NOTE — ED Notes (Signed)
Pt was found attempting to leave bed. Pt removed IV that was running heparin and had blood all over the floor. Pt stated that he was ready to leave and is hungry. Nurse educated pt on importance of staying and the importance of using a call bell when needing assistance. Pt verbalized understanding but still wants to leave.

## 2023-04-25 NOTE — Progress Notes (Signed)
PHARMACY - ANTICOAGULATION CONSULT NOTE  Pharmacy Consult for heparin Indication: pulmonary embolus  No Known Allergies  Patient Measurements: Height: 5\' 5"  (165.1 cm) Weight: 71.2 kg (157 lb) IBW/kg (Calculated) : 61.5 Heparin Dosing Weight: 71.2 kg  Vital Signs: Temp: 98.6 F (37 C) (01/20 1217) Temp Source: Oral (01/20 1217) BP: 146/80 (01/20 1335) Pulse Rate: 70 (01/20 1335)  Labs: Recent Labs    04/24/23 1524 04/24/23 1846 04/25/23 0055 04/25/23 1004 04/25/23 1231  HGB 16.6  --  15.1  --   --   HCT 52.8*  --  47.8  --   --   PLT 97*  --  88*  --   --   LABPROT  --  23.7* 21.4*  --   --   INR  --  2.1* 1.8*  --   --   HEPARINUNFRC  --   --  >1.10* >1.10* <0.10*  CREATININE 1.55*  --  1.47*  --   --   TROPONINIHS 51*  --   --   --   --    Estimated Creatinine Clearance: 41.3 mL/min (A) (by C-G formula based on SCr of 1.47 mg/dL (H)).  Medical History: Past Medical History:  Diagnosis Date   Anginal pain (HCC)    Coronary artery disease    Hyperlipidemia    Hypertension    Myocardial infarction [facility])    Renal disorder    kidney stones   Assessment: 68 y/o male presenting with dizziness and decreased po intake. PMH significant for CAD, hypertension, hyperlipidemia. CT imaging concerning for pulmonary embolism (vs malignancy) and mural thrombus. Pharmacy has been consulted to initiate apixaban. Per chart review, patient is not on anticoagulation prior to admission.  Baseline labs: hgb 16.6, plt 97, INR 1.8  Goal of Therapy:  Heparin level 0.3-0.7 units/ml Monitor platelets by anticoagulation protocol: Yes   Date Time Results Comments  1/19 1825  Heparin infusion started @ 1200 units/hr  1/20 0055 HL > 1.10 Infusion rate decreased 1200 ->950 units/hr  1/20 1004 HL > 1.10 HOLD infusion and repeat level STAT  1/20 1231 HL < 0.10 Bolus, then resume heparin infusion at 1200 un/hr             Plan:  Start apixaban 10 mg orally twice daily for the first 7  days of therapy; after 7 days, 5 mg orally twice daily CBC ans SCR at least once weekly while on apixaban  Burnis Medin PharmD, BCPS 04/25/2023 2:36 PM

## 2023-04-25 NOTE — Progress Notes (Signed)
Patient wanting to leave.  I told him he will likely die if he leaves.  He says he does not care about dying he has been dealing with these problems for years.  Spoke with psychiatry team to see him to assess his capacity to make this decision.  Asked nursing staff to call me when family member or friend comes in to see him.  Dr. Renae Gloss

## 2023-04-25 NOTE — Progress Notes (Addendum)
Case discussed with psychiatry team.  Since the patient cannot give a good reason on why he needs to leave, he currently does not have the capacity to make a good medical decision.  If he goes home he will likely die without treatment.  Will involuntary commit to the hospital at this point until we do our workup. Spoke with friend at the bedside Earlier left 2 messages for son.  Dr. Alford Highland

## 2023-04-25 NOTE — ED Notes (Signed)
Called CT to let them know pt is ready to go to CT scan

## 2023-04-25 NOTE — Consult Note (Signed)
Received consult for capacity for this patient. 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 Psychiatry consulted for capacity as pt has been demanding to be discharged.   On assessment, pt is alert. Able to tell me his name "Dennis Church", date of birth "June 04, 2055". States he is 68 y/o then corrects himself to 68 y/o. Able to tell me we are in [place] and he lives alone. When asked about month or year, states "you and I both know". When asked reason for presenting to the emergency department, states he has been "having issues with my balance". States he was seen by a "specialist" several weeks ago and was told they did not know what was going on. When asked further assessment questions, states he wants to leave and does not respond. States he plans on ripping out his monitoring equipment and leaving.   To exhibit capacity, pt must demonstrate understanding, appreciation, reasoning, and communication. Pt is not demonstrating these during the assessment. Case discussed with attending psychiatrist, Dr. Sherron Flemings. Pt does not have capacity at this time.

## 2023-04-26 ENCOUNTER — Telehealth (HOSPITAL_COMMUNITY): Payer: Self-pay

## 2023-04-26 ENCOUNTER — Other Ambulatory Visit (HOSPITAL_COMMUNITY): Payer: Self-pay

## 2023-04-26 ENCOUNTER — Encounter: Payer: Self-pay | Admitting: Internal Medicine

## 2023-04-26 DIAGNOSIS — I5023 Acute on chronic systolic (congestive) heart failure: Secondary | ICD-10-CM

## 2023-04-26 DIAGNOSIS — I714 Abdominal aortic aneurysm, without rupture, unspecified: Secondary | ICD-10-CM

## 2023-04-26 DIAGNOSIS — I639 Cerebral infarction, unspecified: Secondary | ICD-10-CM | POA: Diagnosis not present

## 2023-04-26 DIAGNOSIS — I5082 Biventricular heart failure: Secondary | ICD-10-CM

## 2023-04-26 DIAGNOSIS — J90 Pleural effusion, not elsewhere classified: Secondary | ICD-10-CM | POA: Diagnosis not present

## 2023-04-26 DIAGNOSIS — R918 Other nonspecific abnormal finding of lung field: Secondary | ICD-10-CM | POA: Diagnosis not present

## 2023-04-26 DIAGNOSIS — N401 Enlarged prostate with lower urinary tract symptoms: Secondary | ICD-10-CM

## 2023-04-26 DIAGNOSIS — Z046 Encounter for general psychiatric examination, requested by authority: Secondary | ICD-10-CM

## 2023-04-26 DIAGNOSIS — R338 Other retention of urine: Secondary | ICD-10-CM

## 2023-04-26 DIAGNOSIS — I5022 Chronic systolic (congestive) heart failure: Secondary | ICD-10-CM

## 2023-04-26 LAB — ECHOCARDIOGRAM COMPLETE
AR max vel: 1 cm2
AV Area VTI: 0.95 cm2
AV Area mean vel: 0.95 cm2
AV Mean grad: 8 mm[Hg]
AV Peak grad: 14.3 mm[Hg]
Ao pk vel: 1.89 m/s
Area-P 1/2: 3.1 cm2
Est EF: <20
Height: 65 in
MV VTI: 1.44 cm2
S' Lateral: 3.2 cm
Weight: 2512 [oz_av]

## 2023-04-26 LAB — BASIC METABOLIC PANEL
Anion gap: 11 (ref 5–15)
BUN: 29 mg/dL — ABNORMAL HIGH (ref 8–23)
CO2: 27 mmol/L (ref 22–32)
Calcium: 8.6 mg/dL — ABNORMAL LOW (ref 8.9–10.3)
Chloride: 98 mmol/L (ref 98–111)
Creatinine, Ser: 1.55 mg/dL — ABNORMAL HIGH (ref 0.61–1.24)
GFR, Estimated: 48 mL/min — ABNORMAL LOW (ref >60)
Glucose, Bld: 76 mg/dL (ref 70–99)
Potassium: 3.5 mmol/L (ref 3.5–5.1)
Sodium: 136 mmol/L (ref 135–145)

## 2023-04-26 LAB — CBC
HCT: 43.4 % (ref 39.0–52.0)
Hemoglobin: 14.1 g/dL (ref 13.0–17.0)
MCH: 25.6 pg — ABNORMAL LOW (ref 26.0–34.0)
MCHC: 32.5 g/dL (ref 30.0–36.0)
MCV: 78.9 fL — ABNORMAL LOW (ref 80.0–100.0)
Platelets: 88 10*3/uL — ABNORMAL LOW (ref 150–400)
RBC: 5.5 MIL/uL (ref 4.22–5.81)
RDW: 19 % — ABNORMAL HIGH (ref 11.5–15.5)
WBC: 5.3 10*3/uL (ref 4.0–10.5)
nRBC: 0 % (ref 0.0–0.2)

## 2023-04-26 LAB — AMMONIA: Ammonia: 18 umol/L (ref 9–35)

## 2023-04-26 MED ORDER — SPIRONOLACTONE 25 MG PO TABS
25.0000 mg | ORAL_TABLET | Freq: Every day | ORAL | Status: DC
  Administered 2023-04-26 – 2023-04-30 (×5): 25 mg via ORAL
  Filled 2023-04-26 (×5): qty 1

## 2023-04-26 MED ORDER — NICOTINE 21 MG/24HR TD PT24
21.0000 mg | MEDICATED_PATCH | Freq: Every day | TRANSDERMAL | Status: DC
  Administered 2023-04-27 – 2023-04-30 (×4): 21 mg via TRANSDERMAL
  Filled 2023-04-26 (×5): qty 1

## 2023-04-26 MED ORDER — SACUBITRIL-VALSARTAN 24-26 MG PO TABS
1.0000 | ORAL_TABLET | Freq: Two times a day (BID) | ORAL | Status: DC
  Filled 2023-04-26: qty 1

## 2023-04-26 NOTE — Progress Notes (Signed)
 Heart Failure Navigator Progress Note  Assessed for Heart & Vascular TOC clinic readiness.  Patient does not meet criteria due to current [facility] patient.   Navigator will sign off at this time.  Roxy Horseman, RN, BSN [facility] Heart Failure Navigator Secure Chat Only

## 2023-04-26 NOTE — Assessment & Plan Note (Addendum)
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  Overall prognosis poor.  Patient on Lasix 40 mg IV twice a day.  Patient started on Aldactone.  Patient on Cozaar also.

## 2023-04-26 NOTE — Progress Notes (Signed)
Notified on call provider at 1945 via secure chat as follows:  Hello. This pt is now receiving IV Lasix and also has an order for a urine sample. He is mostly incontinent. Can we reorder a purewick at night time and for purposes of collecting a urine as well? Thanks.

## 2023-04-26 NOTE — Assessment & Plan Note (Signed)
Patient wanted to leave the hospital on 1/20.  I did not feel that he gave me a good enough reason to leave and have an understanding of his medical severity.  Needed more time to work him up in the hospital and determine if it is safe for him to go home.  Patient was involuntary committed on 1/20.

## 2023-04-26 NOTE — Telephone Encounter (Signed)
Pharmacy Patient Advocate Encounter  Insurance verification completed.    The patient is insured through [facility]. Patient has Medicare and is not eligible for a copay card, but may be able to apply for patient assistance or Medicare RX Payment Plan (Patient Must reach out to their plan, if eligible for payment plan), if available.    Ran test claim for Jardiance and the current 30 day co-pay is $302.00.  Ran test claim for Dennis Church and the current 30 day co-pay is $302.00.  Ran test claim for Entresto and the current 30 day co-pay is $302.00.  Ran test claim for Eliquis and the current 30 day co-pay is $302.00.  This test claim was processed through [facility]- copay amounts may vary at other pharmacies due to pharmacy/plan contracts, or as the patient moves through the different stages of their insurance plan.

## 2023-04-26 NOTE — Assessment & Plan Note (Signed)
5.9 AAA seen on CT scan.  Not stable enough for any intervention currently.

## 2023-04-26 NOTE — Consult Note (Addendum)
Advanced Heart Failure Team Consult Note   Primary Physician: Patient, No Pcp Per Cardiologist:  Previously Dr. Lady Gary with [facility]  Reason for Consultation: Acute systolic CHF  HPI:    Dennis Church is seen today for evaluation of acute systolic CHF at the request of Dr. Melton Alar with [facility].   68 y.o. male with history of CAD, HFmrEF/ICM, HTN, HLD, longstanding tobacco use and BPH.   In 2006 he had stent to LAD in setting of anterior MI. Last cath in 2017 with CTO RCA w/ collaterals, 40% m LCX, 70% distal LAD, patent previously placed stent in m LAD. CAD treated medically.  Echo at [facility] 2013: EF 45% with apical AK  Last seen by Dr. Lady Gary in 2021.  Presented to the ED on 04/24/23 with acute respiratory failure and noted worsening dyspnea, fatigue and falls over the course of 1-2 weeks. Labs: Scr 1.55, K 4.1, Na 136, CO2 22, AST/ALT WNL, T bili 3.5, albumin 2.9, BNP > 4500, HS troponin 51, lactic acid 2.1.1.4. He was in NSR. CTA chest w/o clear filling defect, masslike consolidation in RLL infrahilar region measuring 3.6 cm, large R pleural effusion, small mural thrombi at LV apex, reflux of contrast into hepatic veins, 5.9 cm infrarenal AAA. MRI brain with small acute infarcts in left putamen. He was admitted for further workup. Attempted to leave on 01/20 and was involuntarily committed. Neurology consulted for stroke and Cardiology for acute CHF. Underwent right sided thoracentesis which yielded 1.9L fluid.  Echo with LVEF < 20%, RV severely reduced, thick RV and LV, RVSP 50 mmHg, moderate MR, moderate TR  Home Medications Prior to Admission medications   Medication Sig Start Date End Date Taking? Authorizing Provider  amLODipine (NORVASC) 5 MG tablet Take 5 mg by mouth daily. 06/30/20  Yes [provider]  aspirin EC 81 MG tablet Take 81 mg by mouth daily.   Yes [provider]  atorvastatin (LIPITOR) 80 MG tablet Take 80 mg by mouth every evening.   Yes [provider]  clopidogrel (PLAVIX) 75 MG tablet Take 75 mg by mouth daily.   Yes [provider]  isosorbide mononitrate (IMDUR) 60 MG 24 hr tablet Take 60 mg by mouth daily.   Yes [provider]  metoprolol tartrate (LOPRESSOR) 25 MG tablet Take 25 mg by mouth 2 (two) times daily.   Yes [provider]  nitroGLYCERIN (NITROSTAT) 0.4 MG SL tablet Place 0.4 mg under the tongue every 5 (five) minutes x 3 doses as needed.   Yes [provider]  tamsulosin (FLOMAX) 0.4 MG CAPS capsule Take 1 capsule (0.4 mg total) by mouth daily. 08/19/21  Yes Riki Altes, MD    Past Medical History: Past Medical History:  Diagnosis Date   Anginal pain (HCC)    Coronary artery disease    Hyperlipidemia    Hypertension    Myocardial infarction [facility])    Renal disorder    kidney stones    Past Surgical History: Past Surgical History:  Procedure Laterality Date   CARDIAC CATHETERIZATION Left 04/25/2015   Procedure: Left Heart Cath and Coronary Angiography;  Surgeon: Dalia Heading, MD;  Location: [facility] INVASIVE CV LAB;  Service: Cardiovascular;  Laterality: Left;   CAROTID ENDARTERECTOMY Left    CORONARY ANGIOPLASTY WITH STENT PLACEMENT     LITHOTRIPSY      Family History: History reviewed. No pertinent family history.  Social History: Social History   Socioeconomic History  Marital status: Single    Spouse name: Not on file   Number of children: Not on file   Years of education: Not on file   Highest education level: Not on file  Occupational History   Not on file  Tobacco Use   Smoking status: Former   Smokeless tobacco: Never  Vaping Use   Vaping status: Never Used  Substance and Sexual Activity   Alcohol use: No   Drug use: Not Currently   Sexual activity: Not on file  Other Topics Concern   Not on file  Social History Narrative   Not on file   Social Drivers of Health   Financial Resource Strain: Not on file  Food Insecurity: No Food  Insecurity (04/25/2023)   Hunger Vital Sign    Worried About Running Out of Food in the Last Year: Never true    Ran Out of Food in the Last Year: Never true  Transportation Needs: No Transportation Needs (04/25/2023)   PRAPARE - Administrator, Civil Service (Medical): No    Lack of Transportation (Non-Medical): No  Physical Activity: Not on file  Stress: Not on file  Social Connections: Socially Isolated (04/25/2023)   Social Connection and Isolation Panel [NHANES]    Frequency of Communication with Friends and Family: Once a week    Frequency of Social Gatherings with Friends and Family: Once a week    Attends Religious Services: Never    Database administrator or Organizations: No    Attends Engineer, structural: Never    Marital Status: Divorced    Allergies:  No Known Allergies  Objective:    Vital Signs:   Temp:  [97.4 F (36.3 C)-98 F (36.7 C)] 97.4 F (36.3 C) (01/21 1245) Pulse Rate:  [74-96] 78 (01/21 1245) Resp:  [18-19] 19 (01/21 1245) BP: (113-131)/(62-84) 126/76 (01/21 1245) SpO2:  [95 %-100 %] 95 % (01/21 1245) Weight:  [73 kg] 73 kg (01/21 0500) Last BM Date : 04/25/23  Weight change: Filed Weights   04/24/23 1516 04/24/23 1626 04/26/23 0500  Weight: 79.4 kg 71.2 kg 73 kg    Intake/Output:   Intake/Output Summary (Last 24 hours) at 04/26/2023 1524 Last data filed at 04/26/2023 0412 Gross per 24 hour  Intake 0 ml  Output 525 ml  Net -525 ml      Physical Exam    General:  Disheveled, chronically ill appearing HEENT: normal Neck: supple. JVP elevated.  Cor: PMI nondisplaced. Regular rate & rhythm. No rubs, gallops or murmurs. Lungs: clear Abdomen: soft, nontender, nondistended.  Extremities: no cyanosis, clubbing, rash, edema Neuro: alert & orientedx3. Affect pleasant  EKG    SR 88 bpm, anterior Qs  Labs   Basic Metabolic Panel: Recent Labs  Lab 04/24/23 1524 04/25/23 0055 04/26/23 0427  NA 136 136 136  K 4.1  4.1 3.5  CL 102 100 98  CO2 22 23 27   GLUCOSE 100* 96 76  BUN 32* 29* 29*  CREATININE 1.55* 1.47* 1.55*  CALCIUM 9.2 8.5* 8.6*  MG 2.4  --   --     Liver Function Tests: Recent Labs  Lab 04/24/23 1524 04/25/23 0055  AST 29 26  ALT 22 22  ALKPHOS 95 87  BILITOT 3.5* 2.5*  PROT 7.1 6.0*  ALBUMIN 2.9* 2.6*   No results for input(s): "LIPASE", "AMYLASE" in the last 168 hours. Recent Labs  Lab 04/26/23 0912  AMMONIA 18    CBC: Recent Labs  Lab 04/24/23 1524 04/25/23 0055 04/26/23 0427  WBC 5.3 5.1 5.3  HGB 16.6 15.1 14.1  HCT 52.8* 47.8 43.4  MCV 82.6 82.7 78.9*  PLT 97* 88* 88*    Cardiac Enzymes: No results for input(s): "CKTOTAL", "CKMB", "CKMBINDEX", "TROPONINI" in the last 168 hours.  BNP: BNP (last 3 results) Recent Labs    04/24/23 1524  BNP >4,500.0*    ProBNP (last 3 results) No results for input(s): "PROBNP" in the last 8760 hours.   CBG: Recent Labs  Lab 04/24/23 1706  GLUCAP 101*    Coagulation Studies: Recent Labs    04/24/23 1846 04/25/23 0055  LABPROT 23.7* 21.4*  INR 2.1* 1.8*     Imaging   CT ANGIO HEAD NECK W WO CM Result Date: 04/25/2023 CLINICAL DATA:  Neuro deficit, acute, stroke suspected EXAM: CT ANGIOGRAPHY HEAD AND NECK WITH AND WITHOUT CONTRAST TECHNIQUE: Multidetector CT imaging of the head and neck was performed using the standard protocol during bolus administration of intravenous contrast. Multiplanar CT image reconstructions and MIPs were obtained to evaluate the vascular anatomy. Carotid stenosis measurements (when applicable) are obtained utilizing NASCET criteria, using the distal internal carotid diameter as the denominator. RADIATION DOSE REDUCTION: This exam was performed according to the departmental dose-optimization program which includes automated exposure control, adjustment of the mA and/or kV according to patient size and/or use of iterative reconstruction technique. CONTRAST:  75mL OMNIPAQUE IOHEXOL  350 MG/ML SOLN COMPARISON:  Same day brain MR FINDINGS: CT HEAD FINDINGS Brain: No hemorrhage. No hydrocephalus. No extra-axial fluid collection. No mass effect. No mass lesion. 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 Generalized volume loss without lobar predominance. Vascular: No hyperdense vessel or unexpected calcification. Skull: Normal. Negative for fracture or focal lesion. Sinuses/Orbits: No middle ear or mastoid effusion. Paranasal sinuses are clear. Bilateral lens replacement. Orbits are otherwise unremarkable. Other: None. Review of the MIP images confirms the above findings CTA NECK FINDINGS Aortic arch: Standard branching. Imaged portion shows no evidence of aneurysm or dissection. Mild-to-moderate narrowing in the right subclavian artery Right carotid system: Moderate narrowing (approximately 50%) in the distal cervical ICA on the right (series 12, image 103). Left carotid system: No evidence of dissection, stenosis (50% or greater), or occlusion. Mild narrowing in the distal cervical ICA on the left. 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 Moderate to severe focal narrowing in the V1 segment of the left vertebral artery which arises from the aortic arch. Moderate narrowing in the V2 segment of the left vertebral artery. Skeleton: Negative. Other neck: Negative. Upper chest: Centrilobular and paraseptal emphysema. Small left pleural effusion. 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 Moderate stenosis in the cavernous segment of the left ICA (series 14, image 83). Moderate narrowing in the distal A2 segment of the right ACA (series 10, image 54). 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 No aneurysm. No vascular malformation. Venous sinuses: As permitted by contrast timing, patent. Anatomic variants: None  Review of the MIP images confirms the above findings IMPRESSION: 1. No hemorrhage or CT evidence of an acute cortical infarct. Infarcts seen on same day brain MRI are not visualized on this exam due to CT technique. 2. No emergent large vessel occlusion. 3. 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 There is sever stenosis in  the V1 segment of the left vertebral artery. 4. Moderate narrowing (approximately 50%) in the distal cervical ICA on the right. 5. Moderate to severe narrowing in the cavernous segments of bilateral ICAs. Emphysema (ICD10-J43.9). Electronically Signed   By: Lorenza Cambridge M.D.   On: 04/25/2023 17:52   US THORACENTESIS ASP PLEURAL SPACE W/IMG GUIDE Result Date: 04/25/2023 INDICATION: Patient is a 68 y/o male with history of CAD, HLD, HTN, MI, BPH and tobacco abuse. Patient presents for a diagnostic and therapeutic thoracentesis due to a right pleural effusion. EXAM: ULTRASOUND GUIDED RIGHT THORACENTESIS MEDICATIONS: 12mL of lidocaine 1% COMPLICATIONS: None immediate. PROCEDURE: An ultrasound guided thoracentesis was thoroughly discussed with the patient and questions answered. The benefits, risks, alternatives and complications were also discussed. The patient understands and wishes to proceed with the procedure. Written consent was obtained. Ultrasound was performed to localize and mark an adequate pocket of fluid in the right chest. The area was then prepped and draped in the normal sterile fashion. 1% Lidocaine was used for local anesthesia. Under ultrasound guidance a 6 Fr Safe-T-Centesis catheter was introduced. Thoracentesis was performed. The catheter was removed and a dressing applied. FINDINGS: A total of approximately 1.9L of clear, yellow fluid was removed. Samples were sent to the laboratory as requested by the clinical team. IMPRESSION: Successful ultrasound guided right thoracentesis yielding 1.9L of pleural fluid.  Performed by Philipp Ovens PA-C Electronically Signed   By: Malachy Moan M.D.   On: 04/25/2023 16:30   DG Chest Port 1 View Result Date: 04/25/2023 CLINICAL DATA:  68 year old male status post right thoracentesis EXAM: PORTABLE CHEST 1 VIEW COMPARISON:  Prior chest x-ray 04/24/2023 FINDINGS: No evidence of pneumothorax following right-sided thoracentesis. Significant interval reduction right pleural effusion. Stable cardiomegaly and mild pulmonary edema. IMPRESSION: No evidence of pneumothorax or other complication following right-sided thoracentesis. Electronically Signed   By: Malachy Moan M.D.   On: 04/25/2023 16:19     Medications:     Current Medications:  apixaban  10 mg Oral BID   Followed by   Melene Muller ON 05/02/2023] apixaban  5 mg Oral BID   atorvastatin  80 mg Oral QPM   azithromycin  250 mg Oral Daily   feeding supplement  237 mL Oral BID BM   furosemide  40 mg Intravenous BID   losartan  25 mg Oral Daily   nicotine  21 mg Transdermal Daily   sodium chloride flush  3 mL Intravenous Q12H   spironolactone  25 mg Oral Daily   tamsulosin  0.4 mg Oral Daily    Infusions:  cefTRIAXone (ROCEPHIN)  IV 2 g (04/25/23 2334)      Patient Profile   68 y.o. male with history of CAD s/p anterior MI and stent to LAD in 2006, longstanding tobacco use. Admitted with acute respiratory failure 2/2 large R pleural effusion w/ ? RLL lung mass and acute systolic CHF.   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 CTO of RCA with collaterals. -EF 45% in 2013 with WMA in LAD territory -Echo this admit with LVEF < 20%, RV severely reduced, thick RV and LV, RVSP 50 mmHg, moderate MR, moderate TR.  -Echo reviewed by Dr. Elwyn Lade. Cardiomyopathy appears primarily nonischemic. LV and RV thick raising suspicion for cardiac amyloidosis. -Obtain cMRI and amyloid labs. If workup highly suspicious for cardiac amyloid,  will need to consider benefit of treating pending workup for lung mass -Appears  volume overloaded. Continue diuresis with IV lasix 40 BID -Switch losartan to entresto 24/26 mg BID tomorrow am -Continue spiro 25 mg daily -Hesitant to use SGLT2i with hygiene and history of urinary retention  2. CAD -Hx anterior MI in 2006 s/p PCI to LAD -He's had known CTO to RCA with collaterals. This was unchanged on last cath in 2017 -Not currently on antiplatelet d/t need for anticoagulation. Continue 80 mg atorvastatin daily.  3. LV apical thrombus -Noted on CT -Continue Eliquis 5 mg BID  4. RLL lung mass Large R pleural effusion -RLL mass measuring 3.6 cm -S/p thoracentesis which yielded 1.9L fluid -Awaiting cytology. Possible bronch/EBUS per Pulmonology pending results  5. CVA -MRI brain 2 punctate infarcts left putamen -Neurology following -? Cardioembolic   6. AAA -5.9 cm AAA incidentally noted on imaging -Not stable enough for an intervention  7. CKD IIIa -Scr 1.55 -Follow with diuresis  8. Tobacco use -Has smoked for many years -Encouraged cessation  Length of Stay: 2  Biddie Sebek N, PA-C  04/26/2023, 3:24 PM  Advanced Heart Failure Team Pager [phone] (M-F; 7a - 5p)  Please contact [facility] for night-coverage after hours (4p -7a ) and weekends on [email or web address]

## 2023-04-26 NOTE — Progress Notes (Signed)
Progress Note   Patient: Dennis Church:[id number] DOB: 02-Apr-1956 DOA: 04/24/2023     2 DOS: the patient was seen and examined on 04/26/2023   Brief hospital course: 68 year old man past medical history of CIDP, generalized weakness, BPH, hypertension, tobacco abuse, hyperlipidemia presented with generalized weakness for the last 2 to 3 months with weight loss and fatigue.  Patient was brought in by a neighbor.  Patient states that he has been falling a lot.  Patient was found to have acute respiratory failure and a large pleural effusion with a mass underlying.  Patient had a Foley catheter placed.  Patient was started on IV heparin for mural thrombus seen on CT scan.  Echocardiogram pending.  1/20.  Patient states that he has been having trouble with his balance at home.  MRI of the brain showed 2 small acute infarcts in left [place] and slower absent flow in the right vertebral artery likely chronic.  Patient pulled out an IV.  Patient involuntarily committed.  Patient unable to give [place] a good reason on why he needs to leave today.  Needs further workup here in the hospital.  1.9 L drawn off from underneath the right lung by interventional radiology  1/21.  Cytology still pending.  EF less than 20%.  Patient on 4 L of oxygen this morning.  Assessment and Plan: * Acute stroke due to ischemia [facility]) Neurology believes that this could be hypercoagulability versus small vessel etiology.  Currently on Eliquis.  As per neurology no need for aspirin.  LDL 84.  Start Lipitor.  Acute on chronic systolic CHF (congestive heart failure) (HCC) EF less than 20% with right ventricular failure also.  Overall prognosis poor.  Patient on Lasix 40 mg IV twice a day.  Patient started on Aldactone.  Patient on Cozaar also.  Mass of right lung Thoracentesis on 1/20, With cytology since patient has a lung mass.  Pleural effusion on right 1.9 L removed on 1/20 by interventional radiology with  thoracentesis.  LV (left ventricular) mural thrombus Not commented on echocardiogram but CT scan.  On Eliquis.  CT scan could not rule out pulmonary embolism.   AAA (abdominal aortic aneurysm) (HCC) 5.9 AAA seen on CT scan.  Not stable enough for any intervention currently.  Acute respiratory failure with hypoxia [facility]) Patient with hypoxia in the emergency room of 88%.  Placed on 4 L.  Benign prostatic hyperplasia Continue Flomax.  Had Foley catheter placed.  Patient was pulling at his catheter yesterday so Foley catheter removed before he took it out.  CKD stage 3a, GFR 45-59 ml/min (HCC) Today's creatinine 1.55  with a GFR of 48  Benign essential hypertension Blood pressure very variable based on his mood.  Currently on Cozaar, Aldactone  Severe sepsis (HCC) Ruled out  Tobacco use disorder Nicotine patch.    Involuntary commitment Patient wanted to leave the hospital on 1/20.  I did not feel that he gave me a good enough reason to leave and have an understanding of his medical severity.  Needed more time to work him up in the hospital and determine if it is safe for him to go home.  Patient was involuntary committed on 1/20.        Subjective: Patient feels okay.  Offers no complaints.  Explained that he had 1.9 L drawn off from underneath the right lung yesterday.  Admitted with falls and found to have an acute stroke and large pleural effusion and right lung mass.  Physical Exam: Vitals:   04/25/23 2152 04/25/23 2359 04/26/23 0421 04/26/23 0500  BP: 116/72 131/84 130/62   Pulse: 77 82 76   Resp: 19 18 18    Temp: 98 F (36.7 C) 97.6 F (36.4 C) 98 F (36.7 C)   TempSrc: Oral Oral Oral   SpO2: 96% 99% 97%   Weight:    73 kg  Height:       Physical Exam HENT:     Head: Normocephalic.     Mouth/Throat:     Pharynx: No oropharyngeal exudate.  Eyes:     General: Lids are normal.     Conjunctiva/sclera: Conjunctivae normal.  Cardiovascular:     Rate and  Rhythm: Normal rate and regular rhythm.     Heart sounds: Normal heart sounds, S1 normal and S2 normal.  Pulmonary:     Breath sounds: Examination of the right-lower field reveals decreased breath sounds and rhonchi. Examination of the left-lower field reveals decreased breath sounds and rhonchi. Decreased breath sounds and rhonchi present.  Abdominal:     Palpations: Abdomen is soft.     Tenderness: There is no abdominal tenderness.  Musculoskeletal:     Right lower leg: Swelling present.     Left lower leg: Swelling present.  Skin:    General: Skin is warm.     Findings: No rash.  Neurological:     Mental Status: He is alert.     Comments: Patient able to answer some simple questions appropriately.     Data Reviewed: Echocardiogram shows an EF of less than 20%, severe right volume and pressure overload also. MRI of the brain shows 2 small acute infarcts in the left putamen CT angio head and neck shows severe focal stenosis vertebrobasilar junction, right vertebral artery occluded at its origin Platelet 88, hemoglobin 14.1, creatinine 1.55, potassium 3.5 ammonia level 18  Family Communication: Updated son on the phone  Disposition: Status is: Inpatient Remains inpatient appropriate because: Patient involuntary committed because he wanted to leave yesterday and did not give me a good reason why.  Needs further workup here in the hospital.  Interested to see how he does with physical therapy.  Concerned about his living alone.  Son wanted to wait to see if this is a cancerous process prior to calling palliative care.  Planned Discharge Destination: To be determined    Time spent: 35 minutes.  Case discussed with neurology, cardiology and pulmonary  Author: Alford Highland, MD 04/26/2023 12:38 PM  For on call review [email or web address].

## 2023-04-26 NOTE — Assessment & Plan Note (Signed)
Neurology believes that this could be hypercoagulability versus small vessel etiology.  Currently on Eliquis.  As per neurology no need for aspirin.  LDL 84.  Start Lipitor.

## 2023-04-26 NOTE — Progress Notes (Signed)
@   [id number] summoned to pt room and upon arrival pt was out in the hall at the nursing desk with several staff members around him. He was agitated, yelling and noncompliant. CNA stated that he woke up and stated that he wanted to go to his living room. She was verbally and physically combative. She had to unhook his purewick and his oxygen. He is unsteady on his feet. Reminded him about having had fluid drained from his lungs yesterday, and pointed out the end of his oxygen that wasn't hooked up. He was agitated and stated "yes I remember that! I just want to walk around, haven't you ever just wanted to walk around." He stated he wanted a cup of coffee and a cigaret and how he has lived a long life and that nobody else is around in his family anymore. I reminded him about his son and let him know he would be coming in in the morning. Offered him a cup of coffee. While talking he kept trying to breathe in the oxygen from the oxygen tubing and reminded him that he was not hooked up. He, on his own, turned around and walked back into his room and got into bed. He was given a cup of coffee, but didn't drink it. Re-attached to his 4L [place] and vital signs taken at that time. Currently asleep with a 1:1 sitter in his room and bed alarm on.

## 2023-04-26 NOTE — Progress Notes (Signed)
NEUROLOGY CONSULT FOLLOW UP NOTE   Date of service: April 26, 2023 Patient Name: Dennis Church MRN:  [id number] DOB:  04-Nov-1955  Interval Hx/subjective  Remained agitated. Engineer, agricultural. S/p US guided thora awating biopsy for assessment of bronch/EBUS if needed. Sleepy and non participatory in exam initially, upon waking him up got a burst of loud "leave me alone",  but then apologized for outburst and answered questions. Says he is just trying to rest. I assured we are trying to help. Feels better with O2,  denies CP, SOB, headache or focal neurological symptoms.  Vitals   Vitals:   04/25/23 2152 04/25/23 2359 04/26/23 0421 04/26/23 0500  BP: 116/72 131/84 130/62   Pulse: 77 82 76   Resp: 19 18 18    Temp: 98 F (36.7 C) 97.6 F (36.4 C) 98 F (36.7 C)   TempSrc: Oral Oral Oral   SpO2: 96% 99% 97%   Weight:    73 kg  Height:         Body mass index is 26.78 kg/m.  Physical Exam  General: Comfortably sleeping in bed, still very disheveled in appearance HEENT: Normocephalic atraumatic Neurological exam Sleeping in bed Does not open eyes to voice Upon mildly stimulating him with tactile stimulation in his arm, goes into an outburst of saying to leave him alone.  When I communicated that I am trying to help, he did become more participatory and answered questions appropriately. Mild dysarthria Otherwise unchanged exam with mild subtle right lower facial weakness.  No drift.  No sensory deficits.  Medications  Current Facility-Administered Medications:     stroke: early stages of recovery book, , Does not apply, Once, Wieting, Richard, MD   acetaminophen (TYLENOL) tablet 650 mg, 650 mg, Oral, Q6H PRN **OR** acetaminophen (TYLENOL) suppository 650 mg, 650 mg, Rectal, Q6H PRN, Gertha Calkin, MD   apixaban (ELIQUIS) tablet 10 mg, 10 mg, Oral, BID, 10 mg at 04/25/23 2305 **FOLLOWED BY** [START ON 05/02/2023] apixaban (ELIQUIS) tablet 5 mg, 5 mg, Oral, BID, Lowella Bandy, RPH    atorvastatin (LIPITOR) tablet 80 mg, 80 mg, Oral, QPM, Gertha Calkin, MD, 80 mg at 04/25/23 1811   azithromycin (ZITHROMAX) tablet 250 mg, 250 mg, Oral, Daily, Wieting, Richard, MD, 250 mg at 04/25/23 1330   cefTRIAXone (ROCEPHIN) 2 g in sodium chloride 0.9 % 100 mL IVPB, 2 g, Intravenous, Q24H, Gertha Calkin, MD, Last Rate: 200 mL/hr at 04/25/23 2334, 2 g at 04/25/23 2334   feeding supplement (ENSURE ENLIVE / ENSURE PLUS) liquid 237 mL, 237 mL, Oral, BID BM, Wieting, Richard, MD   furosemide (LASIX) injection 40 mg, 40 mg, Intravenous, BID, Hudson, Caralyn, PA-C, 40 mg at 04/25/23 1811   hydrALAZINE (APRESOLINE) injection 10 mg, 10 mg, Intravenous, Q6H PRN, Gertha Calkin, MD, 10 mg at 04/25/23 0319   isosorbide mononitrate (IMDUR) 24 hr tablet 60 mg, 60 mg, Oral, Daily, Irena Cords V, MD, 60 mg at 04/25/23 0943   losartan (COZAAR) tablet 25 mg, 25 mg, Oral, Daily, Hudson, Caralyn, PA-C, 25 mg at 04/25/23 1057   morphine (PF) 2 MG/ML injection 2 mg, 2 mg, Intravenous, Q4H PRN, Allena Katz, Ekta V, MD   sodium chloride flush (NS) 0.9 % injection 3 mL, 3 mL, Intravenous, Q12H, Irena Cords V, MD, 3 mL at 04/25/23 2306   tamsulosin (FLOMAX) capsule 0.4 mg, 0.4 mg, Oral, Daily, Irena Cords V, MD, 0.4 mg at 04/25/23 0943  Labs and Diagnostic Imaging   CBC:  Recent Labs  Lab 04/25/23 0055 04/26/23 0427  WBC 5.1 5.3  HGB 15.1 14.1  HCT 47.8 43.4  MCV 82.7 78.9*  PLT 88* 88*    Basic Metabolic Panel:  Lab Results  Component Value Date   NA 136 04/26/2023   K 3.5 04/26/2023   CO2 27 04/26/2023   GLUCOSE 76 04/26/2023   BUN 29 (H) 04/26/2023   CREATININE 1.55 (H) 04/26/2023   CALCIUM 8.6 (L) 04/26/2023   GFRNONAA 48 (L) 04/26/2023   GFRAA >60 07/12/2016   Lipid Panel:  Lab Results  Component Value Date   LDLCALC 84 04/25/2023   HgbA1c:  Lab Results  Component Value Date   HGBA1C 6.7 (H) 04/25/2023    Alcohol Level     Component Value Date/Time   ETH <10 04/25/2023 0313   INR   Lab Results  Component Value Date   INR 1.8 (H) 04/25/2023    CT angio Head and Neck with contrast(Personally reviewed): No emergent LVO.  No hemorrhage on the CT.  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  There is severe stenosis in the V1 segment of the left vertebral artery.  Moderate narrowing approximately 50% in the distal cervical ICA on the right.  Moderate to severe narrowing of the cavernous segments of the bilateral ICAs. Emphysema.  MRI Brain(Personally reviewed): 2 punctate infarcts in the left putamen.  Chronic small vessel ischemia.  Slow flow suspected in the right vertebral artery-confirmed occlusion on the CT angiogram.  Unrelated to the current strokes.  Likely chronic.  2D echo LVEF less than 20%.  Left ventricle has severely decreased function with global hypokinesis.  Moderately dilated internal cavitary size of the left ventricle.  Severe RV volume and pressure overload.  Moderately elevated pulmonary artery systolic pressure.  LA severely dilated.  RA severely dilated.  Assessment   Dennis Church is a 68 y.o. male has history of coronary artery disease, hypertension, hyperlipidemia, tobacco abuse presented for evaluation of generalized weakness and gait instability along with dizziness that has been going on for past few weeks to few months.  Lost a lot of weight.  Noted to have a new lung mass, pleural effusion and pulmonary emboli along with an LV thrombus on CTA chest for which he was started on a heparin drip.  2D echocardiogram reveals EF less than 20% with severely dilated LV cavity and moderate dilated LA and RA. The strokes that he had on the MRI are likely small vessel etiology infarcts or related to hypercoagulability. I doubt that that has much bearing on his current clinical condition.  He is likely exhibiting all the signs and symptoms of fatigue and dizziness more likely related to his  cardiomyopathy as well as right heart strain and underlying malignancy and hypoxia from PE.  LDL is 84, A1c 6.7  Acute ischemic stroke-small vessel disease versus hypercoagulability.  Recommendations  From a stroke prevention standpoint, he is currently on heparin, it is okay to switch him on a DOAC when appropriate. High intensity statin for LDL less than 70 Management of the lung cancer, pleural effusion and PE per primary team, pulmonology and oncology as you are. Follow-up in the neurology clinic in 8 to 12 weeks although I I am not sure what his long-term prognosis with his current cardiac status and cancer would be. Plan was discussed with Dr. Renae Gloss. ______________________________________________________________________   Signed, Milon Dikes, MD [facility]

## 2023-04-26 NOTE — Plan of Care (Signed)
  Problem: Education: Goal: Knowledge of disease or condition will improve 04/26/2023 0540 by Birdie Sons, RN Outcome: Progressing 04/26/2023 0255 by Birdie Sons, RN Outcome: Progressing Goal: Knowledge of secondary prevention will improve (MUST DOCUMENT ALL) 04/26/2023 0540 by Birdie Sons, RN Outcome: Progressing 04/26/2023 0255 by Birdie Sons, RN Outcome: Progressing Goal: Knowledge of patient specific risk factors will improve (DELETE if not current risk factor) 04/26/2023 0540 by Birdie Sons, RN Outcome: Progressing 04/26/2023 0255 by Birdie Sons, RN Outcome: Progressing   Problem: Ischemic Stroke/TIA Tissue Perfusion: Goal: Complications of ischemic stroke/TIA will be minimized 04/26/2023 0540 by Birdie Sons, RN Outcome: Progressing 04/26/2023 0255 by Birdie Sons, RN Outcome: Progressing   Problem: Coping: Goal: Will verbalize positive feelings about self 04/26/2023 0540 by Birdie Sons, RN Outcome: Progressing 04/26/2023 0255 by Birdie Sons, RN Outcome: Progressing Goal: Will identify appropriate support needs Outcome: Progressing   Problem: Health Behavior/Discharge Planning: Goal: Ability to manage health-related needs will improve Outcome: Progressing Goal: Goals will be collaboratively established with patient/family Outcome: Progressing   Problem: Self-Care: Goal: Ability to participate in self-care as condition permits will improve Outcome: Progressing Goal: Verbalization of feelings and concerns over difficulty with self-care will improve Outcome: Progressing Goal: Ability to communicate needs accurately will improve Outcome: Progressing

## 2023-04-26 NOTE — Plan of Care (Addendum)
CT chest reviewed s/p  US guided Thoracentesis 1/20 Await pathology reports and then assess for BRONCH/EBUS if needed   Case discussed with DR Hilton Sinclair

## 2023-04-26 NOTE — Progress Notes (Addendum)
[facility] CLINIC CARDIOLOGY PROGRESS NOTE       Patient ID: Dennis Church MRN: [id number] DOB/AGE: May 14, 1955 68 y.o.  Admit date: 04/24/2023 Referring Physician Dr. Irena Cords Primary Physician Patient, No Pcp Per  Primary Cardiologist Dr. Lady Gary (last seen 2021) Reason for Consultation LV thrombus  HPI: Dennis Church is a 68 y.o. male  with a past medical history of hypertension, hyperlipidemia, tobacco use, BPH who presented to the ED on 04/24/2023 for generalized weakness. Cardiology was consulted for further evaluation.   Interval history: -Patient seen and examined this morning, resting comfortably in hospital bed with sitter present. -BP and heart rate remained stable.  He has had good UOP with IV diuresis.  Renal function remained relatively stable. -He is without complaints of chest pain, shortness of breath this morning.  Review of systems complete and found to be negative unless listed above    Past Medical History:  Diagnosis Date   Anginal pain (HCC)    Coronary artery disease    Hyperlipidemia    Hypertension    Myocardial infarction [facility])    Renal disorder    kidney stones    Past Surgical History:  Procedure Laterality Date   CARDIAC CATHETERIZATION Left 04/25/2015   Procedure: Left Heart Cath and Coronary Angiography;  Surgeon: Dalia Heading, MD;  Location: [facility] INVASIVE CV LAB;  Service: Cardiovascular;  Laterality: Left;   CAROTID ENDARTERECTOMY Left    CORONARY ANGIOPLASTY WITH STENT PLACEMENT     LITHOTRIPSY      Medications Prior to Admission  Medication Sig Dispense Refill Last Dose/Taking   amLODipine (NORVASC) 5 MG tablet Take 5 mg by mouth daily.   04/24/2023 Morning   aspirin EC 81 MG tablet Take 81 mg by mouth daily.   04/24/2023 Morning   atorvastatin (LIPITOR) 80 MG tablet Take 80 mg by mouth every evening.   04/23/2023   clopidogrel (PLAVIX) 75 MG tablet Take 75 mg by mouth daily.   04/24/2023 Morning   isosorbide mononitrate (IMDUR) 60 MG 24 hr  tablet Take 60 mg by mouth daily.   04/24/2023 Morning   metoprolol tartrate (LOPRESSOR) 25 MG tablet Take 25 mg by mouth 2 (two) times daily.   04/24/2023 Morning   nitroGLYCERIN (NITROSTAT) 0.4 MG SL tablet Place 0.4 mg under the tongue every 5 (five) minutes x 3 doses as needed.   Taking As Needed   tamsulosin (FLOMAX) 0.4 MG CAPS capsule Take 1 capsule (0.4 mg total) by mouth daily. 30 capsule 1 04/24/2023 Morning   Social History   Socioeconomic History   Marital status: Single    Spouse name: Not on file   Number of children: Not on file   Years of education: Not on file   Highest education level: Not on file  Occupational History   Not on file  Tobacco Use   Smoking status: Former   Smokeless tobacco: Never  Vaping Use   Vaping status: Never Used  Substance and Sexual Activity   Alcohol use: No   Drug use: Not Currently   Sexual activity: Not on file  Other Topics Concern   Not on file  Social History Narrative   Not on file   Social Drivers of Health   Financial Resource Strain: Not on file  Food Insecurity: No Food Insecurity (04/25/2023)   Hunger Vital Sign    Worried About Running Out of Food in the Last Year: Never true    Ran Out of Food in the Last  Year: Never true  Transportation Needs: No Transportation Needs (04/25/2023)   PRAPARE - Administrator, Civil Service (Medical): No    Lack of Transportation (Non-Medical): No  Physical Activity: Not on file  Stress: Not on file  Social Connections: Socially Isolated (04/25/2023)   Social Connection and Isolation Panel [NHANES]    Frequency of Communication with Friends and Family: Once a week    Frequency of Social Gatherings with Friends and Family: Once a week    Attends Religious Services: Never    Database administrator or Organizations: No    Attends Banker Meetings: Never    Marital Status: Divorced  Catering manager Violence: Not At Risk (04/25/2023)   Humiliation, Afraid, Rape,  and Kick questionnaire    Fear of Current or Ex-Partner: No    Emotionally Abused: No    Physically Abused: No    Sexually Abused: No    No family history on file.   Vitals:   04/25/23 2152 04/25/23 2359 04/26/23 0421 04/26/23 0500  BP: 116/72 131/84 130/62   Pulse: 77 82 76   Resp: 19 18 18    Temp: 98 F (36.7 C) 97.6 F (36.4 C) 98 F (36.7 C)   TempSrc: Oral Oral Oral   SpO2: 96% 99% 97%   Weight:    73 kg  Height:        PHYSICAL EXAM General: Ill-appearing male, well nourished, in no acute distress resting comfortably in hospital bed. HEENT: Normocephalic and atraumatic. Neck: No JVD.  Lungs: Normal respiratory effort on 4L [place]. Clear bilaterally to auscultation. No wheezes, crackles, rhonchi.  Heart: HRRR. Normal S1 and S2 without gallops or murmurs.  Abdomen: Non-distended appearing.  Msk: Normal strength and tone for age. Extremities: Warm and well perfused. No clubbing, cyanosis.  2+ pitting edema.  Neuro: Alert and oriented X 3. Psych: Answers questions appropriately.   Labs: Basic Metabolic Panel: Recent Labs    04/24/23 1524 04/25/23 0055 04/26/23 0427  NA 136 136 136  K 4.1 4.1 3.5  CL 102 100 98  CO2 22 23 27   GLUCOSE 100* 96 76  BUN 32* 29* 29*  CREATININE 1.55* 1.47* 1.55*  CALCIUM 9.2 8.5* 8.6*  MG 2.4  --   --    Liver Function Tests: Recent Labs    04/24/23 1524 04/25/23 0055  AST 29 26  ALT 22 22  ALKPHOS 95 87  BILITOT 3.5* 2.5*  PROT 7.1 6.0*  ALBUMIN 2.9* 2.6*   No results for input(s): "LIPASE", "AMYLASE" in the last 72 hours. CBC: Recent Labs    04/25/23 0055 04/26/23 0427  WBC 5.1 5.3  HGB 15.1 14.1  HCT 47.8 43.4  MCV 82.7 78.9*  PLT 88* 88*   Cardiac Enzymes: Recent Labs    04/24/23 1524  TROPONINIHS 51*   BNP: Recent Labs    04/24/23 1524  BNP >4,500.0*   D-Dimer: No results for input(s): "DDIMER" in the last 72 hours. Hemoglobin A1C: Recent Labs    04/25/23 0500  HGBA1C 6.7*   Fasting Lipid  Panel: Recent Labs    04/25/23 0500  CHOL 127  HDL 27*  LDLCALC 84  TRIG 82  CHOLHDL 4.7   Thyroid Function Tests: Recent Labs    04/24/23 1651  TSH 3.359   Anemia Panel: No results for input(s): "VITAMINB12", "FOLATE", "FERRITIN", "TIBC", "IRON", "RETICCTPCT" in the last 72 hours.   Radiology: ECHOCARDIOGRAM COMPLETE Result Date: 04/26/2023    ECHOCARDIOGRAM REPORT  Patient Name:   Dennis Church Date of Exam: 04/25/2023 Medical Rec #:  [id number]     Height:       65.0 in Accession #:    [id number]    Weight:       157.0 lb Date of Birth:  Aug 21, 1955     BSA:          1.785 m Patient Age:    67 years      BP:           117/105 mmHg Patient Gender: M             HR:           81 bpm. Exam Location:  [facility] Procedure: 2D Echo, Cardiac Doppler, Color Doppler and Intracardiac            Opacification Agent Indications:     Other abnormalities of the heart  History:         Patient has no prior history of Echocardiogram examinations.                  CAD, Stroke; Risk Factors:Hypertension, Dyslipidemia and                  Current Smoker. CKD, Mural thrombus.  Sonographer:     Mikki Harbor Referring Phys:  [id number] Dennis Church Diagnosing Phys: Rozell Searing Custovic IMPRESSIONS  1. Left ventricular ejection fraction, by estimation, is <20%. The left ventricle has severely decreased function. The left ventricle demonstrates global hypokinesis. The left ventricular internal cavity size was moderately dilated. Left ventricular diastolic parameters are consistent with Grade I diastolic dysfunction (impaired relaxation).  2. Severe RV volume and pressure overload. Right ventricular systolic function is severely reduced. The right ventricular size is moderately enlarged. There is moderately elevated pulmonary artery systolic pressure. The estimated right ventricular systolic pressure is 50.0 mmHg.  3. Left atrial size was severely dilated.  4. Right atrial size was severely dilated.  5. The mitral valve is  grossly normal. Moderate mitral valve regurgitation.  6. Tricuspid valve regurgitation is moderate.  7. The aortic valve is grossly normal. Aortic valve regurgitation is trivial. Aortic valve sclerosis/calcification is present, without any evidence of aortic stenosis. FINDINGS  Left Ventricle: Left ventricular ejection fraction, by estimation, is <20%. The left ventricle has severely decreased function. The left ventricle demonstrates global hypokinesis. Definity contrast agent was given IV to delineate the left ventricular endocardial borders. The left ventricular internal cavity size was moderately dilated. There is borderline left ventricular hypertrophy. Left ventricular diastolic parameters are consistent with Grade I diastolic dysfunction (impaired relaxation). Right Ventricle: Severe RV volume and pressure overload. The right ventricular size is moderately enlarged. No increase in right ventricular wall thickness. Right ventricular systolic function is severely reduced. There is moderately elevated pulmonary artery systolic pressure. The tricuspid regurgitant velocity is 2.96 m/s, and with an assumed right atrial pressure of 15 mmHg, the estimated right ventricular systolic pressure is 50.0 mmHg. Left Atrium: Left atrial size was severely dilated. Right Atrium: Right atrial size was severely dilated. Pericardium: There is no evidence of pericardial effusion. Mitral Valve: The mitral valve is grossly normal. Moderate mitral valve regurgitation. MV peak gradient, 2.3 mmHg. The mean mitral valve gradient is 1.0 mmHg. Tricuspid Valve: The tricuspid valve is grossly normal. Tricuspid valve regurgitation is moderate. Aortic Valve: The aortic valve is grossly normal. Aortic valve regurgitation is trivial. Aortic valve sclerosis/calcification is present, without any evidence of aortic stenosis. Aortic valve  mean gradient measures 8.0 mmHg. Aortic valve peak gradient measures 14.3 mmHg. Aortic valve area, by VTI  measures 0.95 cm. Pulmonic Valve: The pulmonic valve was grossly normal. Pulmonic valve regurgitation is not visualized. Aorta: The aortic root is normal in size and structure. IAS/Shunts: No atrial level shunt detected by color flow Doppler.  LEFT VENTRICLE PLAX 2D LVIDd:         4.20 cm LVIDs:         3.20 cm LV PW:         1.30 cm LV IVS:        1.30 cm LVOT diam:     1.90 cm LV SV:         35 LV SV Index:   20 LVOT Area:     2.84 cm  RIGHT VENTRICLE RV Basal diam:  4.45 cm RV Mid diam:    4.30 cm RV S prime:     6.66 cm/s LEFT ATRIUM             Index        RIGHT ATRIUM           Index LA diam:        4.40 cm 2.47 cm/m   RA Area:     22.10 cm LA Vol (A2C):   88.9 ml 49.81 ml/m  RA Volume:   79.50 ml  44.54 ml/m LA Vol (A4C):   79.3 ml 44.43 ml/m LA Biplane Vol: 85.7 ml 48.02 ml/m  AORTIC VALVE                     PULMONIC VALVE AV Area (Vmax):    1.00 cm      PV Vmax:       0.92 m/s AV Area (Vmean):   0.95 cm      PV Peak grad:  3.4 mmHg AV Area (VTI):     0.95 cm AV Vmax:           189.00 cm/s AV Vmean:          127.000 cm/s AV VTI:            0.369 m AV Peak Grad:      14.3 mmHg AV Mean Grad:      8.0 mmHg LVOT Vmax:         66.60 cm/s LVOT Vmean:        42.500 cm/s LVOT VTI:          0.124 m LVOT/AV VTI ratio: 0.34  AORTA Ao Root diam: 3.10 cm MITRAL VALVE               TRICUSPID VALVE MV Area (PHT): 3.10 cm    TR Peak grad:   35.0 mmHg MV Area VTI:   1.44 cm    TR Vmax:        296.00 cm/s MV Peak grad:  2.3 mmHg MV Mean grad:  1.0 mmHg    SHUNTS MV Vmax:       0.76 m/s    Systemic VTI:  0.12 m MV Vmean:      37.1 cm/s   Systemic Diam: 1.90 cm MV Decel Time: 245 msec MV E velocity: 73.70 cm/s MV A velocity: 64.30 cm/s MV E/A ratio:  1.15 Designer, multimedia signed by Clotilde Dieter Signature Date/Time: 04/26/2023/9:17:35 AM    Final    CT ANGIO HEAD NECK W WO CM Result Date: 04/25/2023 CLINICAL DATA:  Neuro deficit, acute, stroke suspected  EXAM: CT ANGIOGRAPHY HEAD AND NECK WITH  AND WITHOUT CONTRAST TECHNIQUE: Multidetector CT imaging of the head and neck was performed using the standard protocol during bolus administration of intravenous contrast. Multiplanar CT image reconstructions and MIPs were obtained to evaluate the vascular anatomy. Carotid stenosis measurements (when applicable) are obtained utilizing NASCET criteria, using the distal internal carotid diameter as the denominator. RADIATION DOSE REDUCTION: This exam was performed according to the departmental dose-optimization program which includes automated exposure control, adjustment of the mA and/or kV according to patient size and/or use of iterative reconstruction technique. CONTRAST:  75mL OMNIPAQUE IOHEXOL 350 MG/ML SOLN COMPARISON:  Same day brain MR FINDINGS: CT HEAD FINDINGS Brain: No hemorrhage. No hydrocephalus. No extra-axial fluid collection. No mass effect. No mass lesion. 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 Generalized volume loss without lobar predominance. Vascular: No hyperdense vessel or unexpected calcification. Skull: Normal. Negative for fracture or focal lesion. Sinuses/Orbits: No middle ear or mastoid effusion. Paranasal sinuses are clear. Bilateral lens replacement. Orbits are otherwise unremarkable. Other: None. Review of the MIP images confirms the above findings CTA NECK FINDINGS Aortic arch: Standard branching. Imaged portion shows no evidence of aneurysm or dissection. Mild-to-moderate narrowing in the right subclavian artery Right carotid system: Moderate narrowing (approximately 50%) in the distal cervical ICA on the right (series 12, image 103). Left carotid system: No evidence of dissection, stenosis (50% or greater), or occlusion. Mild narrowing in the distal cervical ICA on the left. 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 Moderate to severe focal narrowing in the V1 segment of the left vertebral artery which arises from the aortic arch. Moderate  narrowing in the V2 segment of the left vertebral artery. Skeleton: Negative. Other neck: Negative. Upper chest: Centrilobular and paraseptal emphysema. Small left pleural effusion. 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 Moderate stenosis in the cavernous segment of the left ICA (series 14, image 83). Moderate narrowing in the distal A2 segment of the right ACA (series 10, image 54). 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 No aneurysm. No vascular malformation. Venous sinuses: As permitted by contrast timing, patent. Anatomic variants: None Review of the MIP images confirms the above findings IMPRESSION: 1. No hemorrhage or CT evidence of an acute cortical infarct. Infarcts seen on same day brain MRI are not visualized on this exam due to CT technique. 2. No emergent large vessel occlusion. 3. 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 There is sever stenosis in the V1 segment of the left vertebral artery. 4. Moderate narrowing (approximately 50%) in the distal cervical ICA on the right. 5. Moderate to severe narrowing in the cavernous segments of bilateral ICAs. Emphysema (ICD10-J43.9). Electronically Signed   By: Lorenza Cambridge M.D.   On: 04/25/2023 17:52   US THORACENTESIS ASP PLEURAL SPACE W/IMG GUIDE Result Date: 04/25/2023 INDICATION: Patient is a 68 y/o male with history of CAD, HLD, HTN, MI, BPH and tobacco abuse. Patient presents for a diagnostic and therapeutic thoracentesis due to a right pleural effusion. EXAM: ULTRASOUND GUIDED RIGHT THORACENTESIS MEDICATIONS: 12mL of lidocaine 1% COMPLICATIONS: None immediate. PROCEDURE: An ultrasound guided thoracentesis was thoroughly discussed with the patient and questions answered. The benefits, risks, alternatives and complications were  also discussed. The patient understands and wishes to proceed with the procedure. Written consent  was obtained. Ultrasound was performed to localize and mark an adequate pocket of fluid in the right chest. The area was then prepped and draped in the normal sterile fashion. 1% Lidocaine was used for local anesthesia. Under ultrasound guidance a 6 Fr Safe-T-Centesis catheter was introduced. Thoracentesis was performed. The catheter was removed and a dressing applied. FINDINGS: A total of approximately 1.9L of clear, yellow fluid was removed. Samples were sent to the laboratory as requested by the clinical team. IMPRESSION: Successful ultrasound guided right thoracentesis yielding 1.9L of pleural fluid. Performed by Philipp Ovens PA-C Electronically Signed   By: Malachy Moan M.D.   On: 04/25/2023 16:30   DG Chest Port 1 View Result Date: 04/25/2023 CLINICAL DATA:  68 year old male status post right thoracentesis EXAM: PORTABLE CHEST 1 VIEW COMPARISON:  Prior chest x-ray 04/24/2023 FINDINGS: No evidence of pneumothorax following right-sided thoracentesis. Significant interval reduction right pleural effusion. Stable cardiomegaly and mild pulmonary edema. IMPRESSION: No evidence of pneumothorax or other complication following right-sided thoracentesis. Electronically Signed   By: Malachy Moan M.D.   On: 04/25/2023 16:19   MR BRAIN W WO CONTRAST Result Date: 04/25/2023 CLINICAL DATA:  CNS neoplasm staging EXAM: MRI HEAD WITHOUT AND WITH CONTRAST TECHNIQUE: Multiplanar, multiecho pulse sequences of the brain and surrounding structures were obtained without and with intravenous contrast. CONTRAST:  7.38mL GADAVIST GADOBUTROL 1 MMOL/ML IV SOLN COMPARISON:  Head CT from yesterday FINDINGS: Brain: 2 punctate areas of restricted diffusion at the left upper putamen. Chronic infarcts scattered along the cerebral cortex, especially left superior frontal and right occipital. Ischemic gliosis in the cerebral Goldner  matter with chronic lacunes in the bilateral thalamus. Ischemic gliosis is extensive in the cerebral Rickel matter. Brain atrophy is present. There are a few chronic blood products in the deep cerebellum, brainstem, left periatrial Aydt matter, and left thalamus which are likely related to chronic small vessel disease. No abnormal enhancement Vascular: Absent right vertebral flow void just beyond the dura. Skull and upper cervical spine: Normal marrow signal Sinuses/Orbits: Negative IMPRESSION: 1. Two small acute infarcts in the left putamen. 2. 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 3. Slow or absent flow in the right vertebral artery, likely chronic in the absence of acute infarct in this distribution. 4. No evidence of metastatic disease. Electronically Signed   By: Tiburcio Pea M.D.   On: 04/25/2023 09:17   CT Angio Chest Pulmonary Embolism (PE) W or WO Contrast Result Date: 04/24/2023 CLINICAL DATA:  Weight loss, fatigue, weakness, sepsis EXAM: CT ANGIOGRAPHY CHEST CT ABDOMEN AND PELVIS WITH CONTRAST TECHNIQUE: Multidetector CT imaging of the chest was performed using the standard protocol during bolus administration of intravenous contrast. Multiplanar CT image reconstructions and MIPs were obtained to evaluate the vascular anatomy. Multidetector CT imaging of the abdomen and pelvis was performed using the standard protocol during bolus administration of intravenous contrast. RADIATION DOSE REDUCTION: This exam was performed according to the departmental dose-optimization program which includes automated exposure control, adjustment of the mA and/or kV according to patient size and/or use of iterative reconstruction technique. CONTRAST:  75mL OMNIPAQUE IOHEXOL 350 MG/ML SOLN COMPARISON:  04/24/2023, 06/30/2020, 07/12/2016 FINDINGS: CTA CHEST FINDINGS Cardiovascular: There is technically adequate opacification of the main pulmonary artery. Mixing artifact is identified. There is  decreased contrast enhancement of the right middle and right lower lobe pulmonary arteries, which could be due to pulmonary emboli versus decreased perfusion due to presumed underlying chronic consolidation. No other filling defects are identified. The heart  is enlarged, with mild left ventricular dilatation. Mural thrombus is seen within the left ventricular apex, reference image 7 of series 9 on the abdominal CT exam. There is reflux of contrast into the hepatic veins, suggesting an element of right sided cardiac dysfunction. No pericardial effusion. Normal caliber of the thoracic aorta. Evaluation of the aortic lumen is limited due to timing of the contrast bolus. Atherosclerosis of the aorta and coronary vasculature. Mediastinum/Nodes: There are numerous subcentimeter mediastinal and hilar lymph nodes, without evidence of pathologic adenopathy. Thyroid, trachea, and esophagus are grossly normal. Lungs/Pleura: There is a large right pleural effusion, volume estimated in excess of 2 L. There is rounded masslike consolidation of the right lower lobe in the infrahilar region, measuring up to 3.6 cm reference image 91/2. Neoplasm cannot be excluded. Peripheral consolidation and volume loss elsewhere within the right lower lobe consistent with atelectasis. Indeterminate 4 mm left upper lobe pulmonary nodule reference image 19/3. Trace left pleural effusion. Upper lobe predominant emphysema.  No pneumothorax. Musculoskeletal: No acute or destructive bony abnormalities. Reconstructed images demonstrate no additional findings. Review of the MIP images confirms the above findings. CT ABDOMEN and PELVIS FINDINGS Hepatobiliary: No focal liver abnormality is seen. No gallstones, gallbladder wall thickening, or biliary dilatation. Pancreas: Unremarkable. No pancreatic ductal dilatation or surrounding inflammatory changes. Spleen: Normal in size without focal abnormality. Adrenals/Urinary Tract: Mild bilateral renal cortical  atrophy and scattered areas of renal cortical scarring, right greater than left. No urinary tract calculi or obstructive uropathy. Bladder is moderately distended, with multiple bladder diverticula consistent with chronic bladder outlet obstruction. Nonobstructing 2.1 cm bladder calculus layers dependently. The adrenals are unremarkable. Stomach/Bowel: No bowel obstruction or ileus. Distal colonic diverticulosis without diverticulitis. No bowel wall thickening or inflammatory change. Vascular/Lymphatic: There is a 5.9 cm infrarenal abdominal aortic aneurysm, with no evidence of leak or rupture. Chronic calcification is seen within the ventral aspect of the thrombosed portion of the aneurysm. Diffuse aortic atherosclerosis. No pathologic adenopathy. Reproductive: Marked enlargement of the prostate measuring 5.8 x 5.6 cm. Other: Moderate ascites, most pronounced within the bilateral flanks. No free intraperitoneal gas. No abdominal wall hernia. Musculoskeletal: Diffuse body wall edema. No acute or destructive bony abnormalities. Reconstructed images demonstrate no additional findings. Review of the MIP images confirms the above findings. IMPRESSION: Chest: 1. Decreased enhancement of the right middle and right lower lobe pulmonary arteries, without definitive filling defect identified. Differential diagnosis would include decreased perfusion due to chronic consolidation versus pulmonary embolus. 2. Rounded masslike consolidation in the right lower lobe infrahilar region measuring up to 3.6 cm. The appearance is concerning for neoplasm, though underlying infection cannot be excluded. Pulmonology follow-up recommended. 3. Large right pleural effusion volume estimated in excess of 2 L. 4. Volume loss and peripheral consolidation within the right lower lobe, consistent with atelectasis. It is unclear whether this is postobstructive collapse or sys compressive atelectasis from the right pleural effusion. 5. Cardiomegaly,  with prominent left ventricular dilatation. Small mural thrombi at the left ventricular apex best seen on the delayed contrast series on the corresponding CT abdomen exam. Echocardiography recommended. 6. Reflux of contrast into the hepatic veins consistent with cardiac dysfunction. 7.  Aortic Atherosclerosis (ICD10-I70.0). Abdomen/pelvis: 1. 5.9 cm infrarenal abdominal aortic aneurysm. No evidence of aortic leak or rupture. Recommend follow-up CT or MR as appropriate in 6 months and referral to or continued care with vascular specialist. (Ref.: J Vasc Surg. 2018; 67:2-77 and J Am Coll Radiol 2013;10(10):789-794.) 2. Moderate ascites. 3. Enlarged prostate,  with evidence of chronic bladder outlet obstruction. 4. Nonobstructing 2.1 cm bladder calculus. 5. Sigmoid diverticulosis without diverticulitis. 6.  Aortic Atherosclerosis (ICD10-I70.0). 7. Diffuse body wall edema. Critical Value/emergent results were called by telephone at the time of interpretation on 04/24/2023 at 550 pm to provider [facility] , who verbally acknowledged these results. Electronically Signed   By: Sharlet Salina M.D.   On: 04/24/2023 18:02   CT ABDOMEN PELVIS W CONTRAST Result Date: 04/24/2023 CLINICAL DATA:  Weight loss, fatigue, weakness, sepsis EXAM: CT ANGIOGRAPHY CHEST CT ABDOMEN AND PELVIS WITH CONTRAST TECHNIQUE: Multidetector CT imaging of the chest was performed using the standard protocol during bolus administration of intravenous contrast. Multiplanar CT image reconstructions and MIPs were obtained to evaluate the vascular anatomy. Multidetector CT imaging of the abdomen and pelvis was performed using the standard protocol during bolus administration of intravenous contrast. RADIATION DOSE REDUCTION: This exam was performed according to the departmental dose-optimization program which includes automated exposure control, adjustment of the mA and/or kV according to patient size and/or use of iterative reconstruction technique.  CONTRAST:  75mL OMNIPAQUE IOHEXOL 350 MG/ML SOLN COMPARISON:  04/24/2023, 06/30/2020, 07/12/2016 FINDINGS: CTA CHEST FINDINGS Cardiovascular: There is technically adequate opacification of the main pulmonary artery. Mixing artifact is identified. There is decreased contrast enhancement of the right middle and right lower lobe pulmonary arteries, which could be due to pulmonary emboli versus decreased perfusion due to presumed underlying chronic consolidation. No other filling defects are identified. The heart is enlarged, with mild left ventricular dilatation. Mural thrombus is seen within the left ventricular apex, reference image 7 of series 9 on the abdominal CT exam. There is reflux of contrast into the hepatic veins, suggesting an element of right sided cardiac dysfunction. No pericardial effusion. Normal caliber of the thoracic aorta. Evaluation of the aortic lumen is limited due to timing of the contrast bolus. Atherosclerosis of the aorta and coronary vasculature. Mediastinum/Nodes: There are numerous subcentimeter mediastinal and hilar lymph nodes, without evidence of pathologic adenopathy. Thyroid, trachea, and esophagus are grossly normal. Lungs/Pleura: There is a large right pleural effusion, volume estimated in excess of 2 L. There is rounded masslike consolidation of the right lower lobe in the infrahilar region, measuring up to 3.6 cm reference image 91/2. Neoplasm cannot be excluded. Peripheral consolidation and volume loss elsewhere within the right lower lobe consistent with atelectasis. Indeterminate 4 mm left upper lobe pulmonary nodule reference image 19/3. Trace left pleural effusion. Upper lobe predominant emphysema.  No pneumothorax. Musculoskeletal: No acute or destructive bony abnormalities. Reconstructed images demonstrate no additional findings. Review of the MIP images confirms the above findings. CT ABDOMEN and PELVIS FINDINGS Hepatobiliary: No focal liver abnormality is seen. No  gallstones, gallbladder wall thickening, or biliary dilatation. Pancreas: Unremarkable. No pancreatic ductal dilatation or surrounding inflammatory changes. Spleen: Normal in size without focal abnormality. Adrenals/Urinary Tract: Mild bilateral renal cortical atrophy and scattered areas of renal cortical scarring, right greater than left. No urinary tract calculi or obstructive uropathy. Bladder is moderately distended, with multiple bladder diverticula consistent with chronic bladder outlet obstruction. Nonobstructing 2.1 cm bladder calculus layers dependently. The adrenals are unremarkable. Stomach/Bowel: No bowel obstruction or ileus. Distal colonic diverticulosis without diverticulitis. No bowel wall thickening or inflammatory change. Vascular/Lymphatic: There is a 5.9 cm infrarenal abdominal aortic aneurysm, with no evidence of leak or rupture. Chronic calcification is seen within the ventral aspect of the thrombosed portion of the aneurysm. Diffuse aortic atherosclerosis. No pathologic adenopathy. Reproductive: Marked enlargement of the prostate  measuring 5.8 x 5.6 cm. Other: Moderate ascites, most pronounced within the bilateral flanks. No free intraperitoneal gas. No abdominal wall hernia. Musculoskeletal: Diffuse body wall edema. No acute or destructive bony abnormalities. Reconstructed images demonstrate no additional findings. Review of the MIP images confirms the above findings. IMPRESSION: Chest: 1. Decreased enhancement of the right middle and right lower lobe pulmonary arteries, without definitive filling defect identified. Differential diagnosis would include decreased perfusion due to chronic consolidation versus pulmonary embolus. 2. Rounded masslike consolidation in the right lower lobe infrahilar region measuring up to 3.6 cm. The appearance is concerning for neoplasm, though underlying infection cannot be excluded. Pulmonology follow-up recommended. 3. Large right pleural effusion volume  estimated in excess of 2 L. 4. Volume loss and peripheral consolidation within the right lower lobe, consistent with atelectasis. It is unclear whether this is postobstructive collapse or sys compressive atelectasis from the right pleural effusion. 5. Cardiomegaly, with prominent left ventricular dilatation. Small mural thrombi at the left ventricular apex best seen on the delayed contrast series on the corresponding CT abdomen exam. Echocardiography recommended. 6. Reflux of contrast into the hepatic veins consistent with cardiac dysfunction. 7.  Aortic Atherosclerosis (ICD10-I70.0). Abdomen/pelvis: 1. 5.9 cm infrarenal abdominal aortic aneurysm. No evidence of aortic leak or rupture. Recommend follow-up CT or MR as appropriate in 6 months and referral to or continued care with vascular specialist. (Ref.: J Vasc Surg. 2018; 67:2-77 and J Am Coll Radiol 2013;10(10):789-794.) 2. Moderate ascites. 3. Enlarged prostate, with evidence of chronic bladder outlet obstruction. 4. Nonobstructing 2.1 cm bladder calculus. 5. Sigmoid diverticulosis without diverticulitis. 6.  Aortic Atherosclerosis (ICD10-I70.0). 7. Diffuse body wall edema. Critical Value/emergent results were called by telephone at the time of interpretation on 04/24/2023 at 550 pm to provider [facility] , who verbally acknowledged these results. Electronically Signed   By: Sharlet Salina M.D.   On: 04/24/2023 18:02   CT Head Wo Contrast Result Date: 04/24/2023 CLINICAL DATA:  Losing weight, decreased energy for months. EXAM: CT HEAD WITHOUT CONTRAST TECHNIQUE: Contiguous axial images were obtained from the base of the skull through the vertex without intravenous contrast. RADIATION DOSE REDUCTION: This exam was performed according to the departmental dose-optimization program which includes automated exposure control, adjustment of the mA and/or kV according to patient size and/or use of iterative reconstruction technique. COMPARISON:  CT head dated  07/24/2004. FINDINGS: Brain: No evidence of acute infarction, hemorrhage, hydrocephalus, extra-axial collection or mass lesion/mass effect. There is small to moderate volume encephalomalacia of the left frontal lobe superomedially and small-to-moderate volume encephalomalacia of the right occipital lobe. There is mild cerebral volume loss with associated ex vacuo dilatation. Periventricular Macknight matter hypoattenuation likely represents chronic small vessel ischemic disease. Vascular: There are vascular calcifications in the carotid siphons. Skull: Normal. Negative for fracture or focal lesion. Sinuses/Orbits: No acute finding. Other: None. IMPRESSION: 1. No acute intracranial process. 2. Small to moderate volume encephalomalacia of the left frontal lobe and right occipital lobe. Electronically Signed   By: Romona Curls M.D.   On: 04/24/2023 17:48   DG Chest 2 View Result Date: 04/24/2023 CLINICAL DATA:  Weakness. EXAM: CHEST - 2 VIEW COMPARISON:  07/12/2016. FINDINGS: Cardiac silhouette borderline enlarged. Left coronary artery stent. No mediastinal or hilar masses. No convincing adenopathy. Moderate right and small left pleural effusions. Additional opacity at the right lung base which may reflect atelectasis or pneumonia. Vascular congestion and bilateral interstitial thickening. Skeletal structures are grossly intact. IMPRESSION: 1. Findings consistent with congestive heart failure.  Moderate right and small left pleural effusions. 2. Right lung base opacity may reflect atelectasis or pneumonia. Electronically Signed   By: Amie Portland M.D.   On: 04/24/2023 16:11    ECHO as above  TELEMETRY reviewed by me 04/26/2023: Sinus rhythm PACs PVCs rate 60s  EKG reviewed by me: Sinus rhythm rate 88 bpm  Data reviewed by me 04/26/2023: last 24h vitals tele labs imaging I/O hospitalist progress note, neurology notes Principal Problem:   Stroke [facility]) Active Problems:   Benign prostatic hyperplasia   Benign  essential hypertension   Tobacco use disorder   Coronary artery disease involving native coronary artery of native heart with angina pectoris (HCC)   Acute respiratory failure with hypoxia (HCC)   CKD stage 3a, GFR 45-59 ml/min (HCC)   Severe sepsis (HCC)   Pleural effusion on right   Mass of right lung   LV (left ventricular) mural thrombus   Stroke, acute, embolic (HCC)   Acute CHF (congestive heart failure) (HCC)    ASSESSMENT AND PLAN:  Dennis Church is a 68 y.o. male  with a past medical history of hypertension, hyperlipidemia, tobacco use, BPH who presented to the ED on 04/24/2023 for generalized weakness. Cardiology was consulted for further evaluation.   # Acute CVA # LV apical thrombus # Acute systolic heart failure # Hypertension Patient initially presented for generalized weakness found to have LV apical thrombus on CTA chest.  Also with significant lower extremity edema and dyspnea on exertion for the last few weeks.  BNP elevated greater than 4500.  MRI head today with 2 acute infarcts.  Echo read this morning demonstrates EF less than 20% with global hypokinesis. -Continue IV heparin.  Continue aspirin 81 mg daily.  Neurology following -Continue IV Lasix 40 mg twice daily. -Start spironolactone 25 mg daily.  Continue losartan 25 mg daily.  Continue Imdur 60 mg daily.  Consider further additions to GDMT pending BP and renal function. -Advanced heart failure to see -Continue to monitor renal function closely with diuresis. -Will likely need LifeVest prior to discharge.  # RLL lung mass # Large R pleural effusion Patient with right lower lobe lung mass noted on CTA chest as well as large right pleural effusion.  Concern for malignancy. -S/p thoracentesis 04/25/2023 with 1 L yielded -Further management per primary team.   This patient's plan of care was discussed and created with Dr. Melton Alar and she is in agreement.  Signed: Gale Journey, PA-C  04/26/2023, 10:12  AM [facility] Cardiology

## 2023-04-26 NOTE — Evaluation (Addendum)
Speech Language Pathology Evaluation Patient Details Name: Dennis Church MRN: [id number] DOB: [birth year] Today's Date: 04/26/2023 Time: 0920-1020 SLP Time Calculation (min) (ACUTE ONLY): 60 min  Problem List:  Patient Active Problem List   Diagnosis Date Noted   Stroke, acute, embolic (HCC) 04/25/2023   Acute CHF (congestive heart failure) (HCC) 04/25/2023   Stroke (HCC) 04/25/2023   Benign prostatic hyperplasia 04/24/2023   Benign essential hypertension 04/24/2023   Tobacco use disorder 04/24/2023   Acute respiratory failure with hypoxia (HCC) 04/24/2023   CKD stage 3a, GFR 45-59 ml/min (HCC) 04/24/2023   Severe sepsis (HCC) 04/24/2023   Pleural effusion on right 04/24/2023   Mass of right lung 04/24/2023   LV (left ventricular) mural thrombus 04/24/2023   Combined fat and carbohydrate induced hyperlipemia 06/26/2014   Coronary artery disease involving native coronary artery of native heart with angina pectoris (HCC) 06/26/2014   Hyperlipidemia, mixed 06/26/2014   Past Medical History:  Past Medical History:  Diagnosis Date   Anginal pain (HCC)    Coronary artery disease    Hyperlipidemia    Hypertension    Myocardial infarction [facility])    Renal disorder    kidney stones   Past Surgical History:  Past Surgical History:  Procedure Laterality Date   CARDIAC CATHETERIZATION Left 04/25/2015   Procedure: Left Heart Cath and Coronary Angiography;  Surgeon: Dalia Heading, MD;  Location: [facility] INVASIVE CV LAB;  Service: Cardiovascular;  Laterality: Left;   CAROTID ENDARTERECTOMY Left    CORONARY ANGIOPLASTY WITH STENT PLACEMENT     LITHOTRIPSY     HPI:  Per ED H&P, pt is a 68 y.o. male with past medical history  of CAD, generalized weakness, BPH, benign essential hypertension, tobacco abuse, hyperlipidemia presenting today with chest generalized weakness since the past 2 to 3 months or so with weight loss and malaise and fatigue.  Per nurses report patient was brought by neighbor  he looks unkempt and is not able to take care of himself lives at home.  Per report patient has been dizzy multiple complaints of feeling weak, loss of appetite, patient reports no energy.  He is alert awake and oriented but is a limited historian.  MRI Imaging results:  1.Two small acute infarcts in the left putamen. 2. 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 3. Slow or absent flow in the right vertebral artery, likely chronic in the absence of acute infarct in this distribution.    Assessment / Plan / Recommendation Clinical Impression   Pt seen today for informal Cognitive-linguistic assessment at bedside using informal assessment tools. Pt awake, verbal and engaged appropriately w/ this SLP. Breakfast meal arrived; cued then assisted pt in sitting EOB for self-feeding and eating the meal(pt initially wanted to lean on his shoulder/side min upright). He followed general instructions w/ min Time as he acclimated to the situation/task. Pt sat EOB correcting his positioning in order to be more midline given cue. He fed himself his breakfast meal throughout, engaged in basic conversation and indicated wants/needs re: food/drink prep. After, he conversed in general conversation, min vague in specific details of questions/tasks. He indicated he lived alone and was responsible for all of his ADLs alone -- house care, grocery shopping, driving included.    On [place] O2 4L, afebrile. WBC WNL. Per pt report, he is NOT assisted in ADLS at home (bills, driving, groceries, housework, hygiene). Pt lives alone. Pt stated he has a "neighbor that checks on me some".  There No  known Baseline of pt's Cognitive functioning as pt lives alone w/ no family in the immediate area per pt.    Pt presents with mild+ cognitive communication impairment characterized by deficits in the areas of memory, mental flexibility, and higher executive functioning per demonstration on certain tasks on the SLUMS and  informal situational tasks/problems. There is unknown information of pt's Baseline Cognitive functioning. A formal score was not obtained -- f/u w/ Neurology POST acute hospitalization would be recommended for such. Pt's current deficits may impact ability to independently manage finances, medications, and maintain safety in the home environment w/out some oversight/Supervision.   Per Imaging results:  1.Two small acute infarcts in the left putamen. 2. 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 3. Slow or absent flow in the right vertebral artery, likely chronic in the absence of acute infarct in this distribution.   Suspect potential for Neurological impact w/ unknown Baseline and need for Supervision in the home Post Discharge in order to see his performance in the home first-hand w/ ADLs, including cooking, finances, driving, shopping, etc. Suspect pt could benefit from f/u w/ Neurology for formal assessment of pt's Cognitive function/status d/t living alone, safety issues.    Primary deficits were associated with memory retrieval tasks and tasks of higher level executive functioning; more complex problem solving and anticipatory awareness. Pt's responses/details of answer to question improved with cueing. Pt was able to ID pictured tasks w/ functional communication then ID problem situations and solutions to such w/ min cues. Pt's responses to questions lacked details but improved w/ cues. He was able to ID need for "911" to be called in certain "emergency" situations, then ID a "neighbor". When asked about driving in icey/bad weather, he stated he felt he could drive "easily", even in icey weather b/c "I've been driving all my life and I know how to do it". Pt exhibited poor Insight into his abilities in certain situations. Pt was oriented to self, place, year(month w/ cue) but not to the full situation of the events that led him to be hospitalized. He often talked about his trucks  and "vehicles" at home, needing to mow the yard but did not have a plan of how he was going to get it mowed except to call the "[employer] that [employer] w/ things like that".  No motor speech deficits noted; pt is Missing some Posterior Dentition which can impact mastication(solids); general aspiration precautions and food prep were reviewed w/ pt. Pt was fully intelligible during general conversation.   If pt is returning home vs a SNF placement, recommend 100% Supervision in the home for Safety Post Discharge, until it is determined that pt is functioning at a safe Baseline. Pt is functional w/ basic tasks in current hospital room immediate to him and makes his wants/needs known appropriately. Staff present for any cues; Sitter currently. No further Acute ST services indicated currently but f/u with ST services in the Outpatient/Home setting for continued assessment and any tx needs as determined was discussed w/ pt and MD/Team.       SLP Assessment  SLP Recommendation/Assessment: All further Speech Lanaguage Pathology  needs can be addressed in the next venue of care (if need determined then) SLP Visit Diagnosis: Cognitive communication deficit (R41.841) (unknown baseline)    Recommendations for follow up therapy are one component of a multi-disciplinary discharge planning process, led by the attending physician.  Recommendations may be updated based on patient status, additional functional criteria and insurance authorization.  Follow Up Recommendations  Follow physician's recommendations for discharge plan and follow up therapies (at next venue of care per needs determined)    Assistance Recommended at Discharge  Set up Supervision/Assistance  Functional Status Assessment  (tbd)  Frequency and Duration  (tbd outpt)   (tbd outpt)      SLP Evaluation Cognition  Overall Cognitive Status: No family/caregiver present to determine baseline cognitive functioning (difficult to  assess/compare to baseline) Arousal/Alertness: Awake/alert Orientation Level: Oriented to person;Oriented to place;Disoriented to time;Disoriented to situation Year: 2025 Attention: Focused;Sustained Focused Attention: Appears intact Sustained Attention: Appears intact Memory: Impaired Memory Impairment: Decreased short term memory (Immediate recall wfl re: meal items eaten) Decreased Short Term Memory: Verbal basic Awareness: Impaired Awareness Impairment: Anticipatory impairment Problem Solving: Impaired Problem Solving Impairment: Verbal complex;Functional complex Executive Function: Reasoning;Decision Making Reasoning: Impaired Reasoning Impairment: Verbal complex Decision Making: Impaired Decision Making Impairment: Verbal complex Behaviors:  (fatigue - min) Safety/Judgment: Impaired Comments: stated he felt he could still drive "even in icey weather b/c i've been driving all my life"       Comprehension  Auditory Comprehension Overall Auditory Comprehension: Impaired (min) Yes/No Questions: Impaired Complex Questions: 75-100% accurate Paragraph Comprehension (via yes/no questions): 51-75% accurate Commands: Within Functional Limits (1-2 step) Conversation: Simple Interfering Components:  (fatgiue - min) EffectiveTechniques: Extra processing time;Pausing Visual Recognition/Discrimination Discrimination: Not tested Reading Comprehension Reading Status: Not tested    Expression Expression Primary Mode of Expression: Verbal Verbal Expression Overall Verbal Expression: Appears within functional limits for tasks assessed Initiation: No impairment Automatic Speech:  (WFL) Level of Generative/Spontaneous Verbalization: Conversation Repetition: No impairment Naming: No impairment Pragmatics: No impairment Effective Techniques:  (n/a) Non-Verbal Means of Communication: Not applicable Written Expression Dominant Hand: Right Written Expression: Not tested   Oral /  Motor  Oral Motor/Sensory Function Overall Oral Motor/Sensory Function: Within functional limits Motor Speech Overall Motor Speech: Appears within functional limits for tasks assessed Respiration: Within functional limits Phonation: Normal Resonance: Within functional limits Articulation: Within functional limitis Intelligibility: Intelligible Motor Planning: Witnin functional limits Motor Speech Errors: Not applicable Interfering Components:  (few missing dentition) Effective Techniques:  (n/a)             Jerilynn Som, MS, CCC-SLP Speech Language Pathologist Rehab Services; [facility] [phone] (ascom) Evalie Hargraves 04/26/2023, 10:43 AM

## 2023-04-26 NOTE — Progress Notes (Signed)
Notified on call provider via secure chat as follows: This pt was admitted to [place] around the change of shift. He is a 1:1 IVC. He previously had a foley cath which was removed. Now he is unaware when he has to be, and unaware when he has peed. He has been incontinent of large amount of urine. Can we have an order for purewick for use at night time?

## 2023-04-26 NOTE — Evaluation (Signed)
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   Clinical Impression   Dennis Church was seen for OT evaluation this date. Prior to hospital admission, pt was IND. Pt lives alone. Pt presents to acute OT demonstrating impaired ADL performance and functional mobility 2/2 decreased activity tolerance and functional strength/balance deficits. Pt currently requires MIN A + RW for ADL t/f and standing grooming tasks, assist for multiple moderate LOBs, scissoring gait noted and cues for RW mgmt with turns. Demonstrates poor insight into deficits. Pt would benefit from skilled OT to address noted impairments and functional limitations (see below for any additional details). Upon hospital discharge, recommend OT follow up.    If plan is discharge home, recommend the following: A lot of help with bathing/dressing/bathroom;A little help with walking and/or transfers    Functional Status Assessment  Patient has had a recent decline in their functional status and demonstrates the ability to make significant improvements in function in a reasonable and predictable amount of time.  Equipment Recommendations  BSC/3in1    Recommendations for Other Services       Precautions / Restrictions Precautions Precautions: Fall Restrictions Weight Bearing Restrictions Per Provider Order: No      Mobility Bed Mobility Overal bed mobility: Modified Independent                  Transfers Overall transfer level: Needs assistance Equipment used: Rolling walker (2 wheels), None Transfers: Sit to/from  Stand Sit to Stand: Contact guard assist           General transfer comment: SBA      Balance Overall balance assessment: Needs assistance Sitting-balance support: Feet supported Sitting balance-Leahy Scale: Fair     Standing balance support: Bilateral upper extremity supported Standing balance-Leahy Scale: Poor                             ADL either performed or assessed with clinical judgement   ADL Overall ADL's : Needs assistance/impaired                                       General ADL Comments: MIN A + RW for ADL t/f and standing grooming tasks, assist for multiple moderate LOBs, scissoring gait noted and cues for RW mgmt with turns.      Pertinent Vitals/Pain Pain Assessment Pain Assessment: No/denies pain     Extremity/Trunk Assessment Upper Extremity Assessment Upper Extremity Assessment: RUE deficits/detail RUE Deficits / Details: 4/5 grossly   Lower Extremity Assessment Lower Extremity Assessment: Defer to PT evaluation       Communication Communication Communication: No apparent difficulties   Cognition Arousal: Alert Behavior During Therapy: WFL for tasks assessed/performed Overall Cognitive Status: No family/caregiver present to determine baseline cognitive functioning                                 General Comments: pt oriented to self and location, noted poor insight  Home Living Family/patient expects to be discharged to:: Private residence Living Arrangements: Alone Available Help at Discharge: Neighbor Type of Home: House Home Access: Stairs to enter Secretary/administrator of Steps: 5 Entrance Stairs-Rails: Can reach both Home Layout: One level     Bathroom Shower/Tub: Chief Strategy Officer: Standard     Home Equipment: Cane - single point   Additional Comments: 4-5 falls in the last 6 months from losing his balance  Lives With: Alone    Prior  Functioning/Environment Prior Level of Function : Independent/Modified Independent;Driving             Mobility Comments: pt stated his neighbor will help him carry in groceries from his car          OT Problem List: Decreased strength;Decreased activity tolerance;Impaired balance (sitting and/or standing)      OT Treatment/Interventions: Self-care/ADL training;Therapeutic exercise;Neuromuscular education;Energy conservation;DME and/or AE instruction;Therapeutic activities    OT Goals(Current goals can be found in the care plan section) Acute Rehab OT Goals Patient Stated Goal: to go home OT Goal Formulation: With patient Time For Goal Achievement: 05/10/23 Potential to Achieve Goals: Good ADL Goals Pt Will Perform Grooming: Independently;standing Pt Will Perform Lower Body Dressing: Independently;sit to/from stand Pt Will Transfer to Toilet: Independently;ambulating;regular height toilet  OT Frequency: Min 1X/week    Co-evaluation PT/OT/SLP Co-Evaluation/Treatment: Yes Reason for Co-Treatment: To address functional/ADL transfers;For patient/therapist safety PT goals addressed during session: Mobility/safety with mobility;Balance;Proper use of DME OT goals addressed during session: ADL's and self-care;Proper use of Adaptive equipment and DME      AM-PAC OT "6 Clicks" Daily Activity     Outcome Measure Help from another person eating meals?: None Help from another person taking care of personal grooming?: A Little Help from another person toileting, which includes using toliet, bedpan, or urinal?: A Little Help from another person bathing (including washing, rinsing, drying)?: A Little Help from another person to put on and taking off regular upper body clothing?: A Little Help from another person to put on and taking off regular lower body clothing?: A Little 6 Click Score: 19   End of Session Equipment Utilized During Treatment: Gait belt;Rolling walker (2  wheels) Nurse Communication: Mobility status  Activity Tolerance: Patient tolerated treatment well Patient left: in chair;with call bell/phone within reach;with nursing/sitter in room  OT Visit Diagnosis: Other abnormalities of gait and mobility (R26.89);Muscle weakness (generalized) (M62.81)                Time: 1115-1140 OT Time Calculation (min): 25 min Charges:  OT General Charges $OT Visit: 1 Visit OT Evaluation $OT Eval Moderate Complexity: 1 Mod OT Treatments $Self Care/Home Management : 8-22 mins  Kathie Dike, M.S. OTR/L  04/26/23, 1:16 PM  ascom [phone]

## 2023-04-26 NOTE — Plan of Care (Signed)

## 2023-04-26 NOTE — Evaluation (Addendum)
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   Clinical Impression  Pt is alert, oriented to self, place, able to provide PLOF but did display limited insight to deficits and decreased safety awareness. Pt stated at baseline he is independent and lives alone, drives, 4-5 falls in the last couple of months. When preparing to sit EOB, noted for urinal spillage with wet sheets and gown, pt unaware. Supine to sit modI, seated balance fair. Sit <> stand with supervision, but when attempting to initiate ambulation, 2 LOB immediately requiring minAx1-2 to correct. With the RW he ambulated ~172ft but still experienced 3 more LOB with minA to correct. Returned to room and left in recliner with OT at bedside.  Overall the patient demonstrated deficits (see "PT Problem List") that impede the patient's functional abilities, safety, and mobility and would benefit from skilled PT intervention.          If plan is discharge home, recommend the following: A little help with walking and/or transfers;A little help with bathing/dressing/bathroom;Assistance with cooking/housework;Assist for transportation;Help with stairs or ramp for entrance;Direct supervision/assist for medications management   Can travel by private vehicle   No    Equipment Recommendations Other (comment) (TBD)  Recommendations for Other Services       Functional Status Assessment Patient has had a recent decline in their functional status and demonstrates the ability to make significant improvements in function in a reasonable and  predictable amount of time.     Precautions / Restrictions Precautions Precautions: Fall Restrictions Weight Bearing Restrictions Per Provider Order: No      Mobility  Bed Mobility Overal bed mobility: Modified Independent                  Transfers Overall transfer level: Needs assistance Equipment used: Rolling walker (2 wheels), None Transfers: Sit to/from Stand Sit to Stand: Supervision                Ambulation/Gait Ambulation/Gait assistance: Min assist, Contact guard assist Gait Distance (Feet): 110 Feet Assistive device: Rolling walker (2 wheels), None         General Gait Details: pt had 5 LOB throughout ambulation. 2 prior to utilizing RW, minAx1-2 to correct. pt with limited insight to these deficits  Stairs            Wheelchair Mobility     Tilt Bed    Modified Rankin (Stroke Patients Only)       Balance Overall balance assessment: Needs assistance Sitting-balance support: Feet supported Sitting balance-Leahy Scale: Fair       Standing balance-Leahy Scale: Poor                               Pertinent Vitals/Pain Pain Assessment Pain Assessment: No/denies pain    Home Living Family/patient expects to be discharged to:: Private residence Living Arrangements: Alone Available Help at Discharge: Neighbor Type of Home: House Home Access: Stairs to enter Entrance Stairs-Rails: Can reach both Entrance Stairs-Number of Steps: 5   Home Layout: One level Home Equipment: Cane - single point Additional Comments:  4-5 falls in the last 6 months from losing his balance    Prior Function Prior Level of Function : Independent/Modified Independent;Driving             Mobility Comments: pt stated his neighbor will help him carry in groceries from his car       Extremity/Trunk Assessment   Upper Extremity Assessment Upper Extremity Assessment: Defer to OT evaluation    Lower Extremity Assessment Lower  Extremity Assessment:  (4/5 bilaterally, 3+/5 hip flexion bilaterally)       Communication      Cognition Arousal: Alert Behavior During Therapy: WFL for tasks assessed/performed                                   General Comments: pt oriented to self, plcae, able to provide PLOF but noted for decreased awareness of deficits and safety        General Comments      Exercises     Assessment/Plan    PT Assessment Patient needs continued PT services  PT Problem List         PT Treatment Interventions      PT Goals (Current goals can be found in the Care Plan section)  Acute Rehab PT Goals Patient Stated Goal: to return to normal PT Goal Formulation: With patient Time For Goal Achievement: 05/10/23 Potential to Achieve Goals: Fair    Frequency       Co-evaluation PT/OT/SLP Co-Evaluation/Treatment: Yes Reason for Co-Treatment: To address functional/ADL transfers;For patient/therapist safety PT goals addressed during session: Mobility/safety with mobility;Balance;Proper use of DME OT goals addressed during session: ADL's and self-care;Proper use of Adaptive equipment and DME       AM-PAC PT "6 Clicks" Mobility  Outcome Measure Help needed turning from your back to your side while in a flat bed without using bedrails?: None Help needed moving from lying on your back to sitting on the side of a flat bed without using bedrails?: A Little Help needed moving to and from a bed to a chair (including a wheelchair)?: A Little Help needed standing up from a chair using your arms (e.g., wheelchair or bedside chair)?: A Little Help needed to walk in hospital room?: A Little Help needed climbing 3-5 steps with a railing? : A Little 6 Click Score: 19    End of Session   Activity Tolerance: Patient tolerated treatment well Patient left: in chair;Other (comment) (with OT at bedside) Nurse Communication: Mobility status PT Visit Diagnosis: Other abnormalities of  gait and mobility (R26.89);Difficulty in walking, not elsewhere classified (R26.2);Muscle weakness (generalized) (M62.81)    Time: 9604-5409 PT Time Calculation (min) (ACUTE ONLY): 18 min   Charges:   PT Evaluation $PT Eval Low Complexity: 1 Low PT Treatments $Therapeutic Activity: 8-22 mins PT General Charges $$ ACUTE PT VISIT: 1 Visit         Olga Coaster PT, DPT 1:06 PM,04/26/23

## 2023-04-27 ENCOUNTER — Inpatient Hospital Stay (HOSPITAL_COMMUNITY): Payer: Medicare Other

## 2023-04-27 DIAGNOSIS — R918 Other nonspecific abnormal finding of lung field: Secondary | ICD-10-CM | POA: Diagnosis not present

## 2023-04-27 DIAGNOSIS — I255 Ischemic cardiomyopathy: Secondary | ICD-10-CM | POA: Diagnosis not present

## 2023-04-27 DIAGNOSIS — I5023 Acute on chronic systolic (congestive) heart failure: Secondary | ICD-10-CM | POA: Diagnosis not present

## 2023-04-27 DIAGNOSIS — I513 Intracardiac thrombosis, not elsewhere classified: Secondary | ICD-10-CM | POA: Diagnosis not present

## 2023-04-27 DIAGNOSIS — I639 Cerebral infarction, unspecified: Secondary | ICD-10-CM | POA: Diagnosis not present

## 2023-04-27 DIAGNOSIS — I5082 Biventricular heart failure: Secondary | ICD-10-CM | POA: Diagnosis not present

## 2023-04-27 LAB — BASIC METABOLIC PANEL
Anion gap: 9 (ref 5–15)
BUN: 28 mg/dL — ABNORMAL HIGH (ref 8–23)
CO2: 29 mmol/L (ref 22–32)
Calcium: 8.4 mg/dL — ABNORMAL LOW (ref 8.9–10.3)
Chloride: 94 mmol/L — ABNORMAL LOW (ref 98–111)
Creatinine, Ser: 1.44 mg/dL — ABNORMAL HIGH (ref 0.61–1.24)
GFR, Estimated: 53 mL/min — ABNORMAL LOW (ref >60)
Glucose, Bld: 82 mg/dL (ref 70–99)
Potassium: 3.3 mmol/L — ABNORMAL LOW (ref 3.5–5.1)
Sodium: 132 mmol/L — ABNORMAL LOW (ref 135–145)

## 2023-04-27 LAB — CBC
HCT: 45.4 % (ref 39.0–52.0)
Hemoglobin: 14.6 g/dL (ref 13.0–17.0)
MCH: 25.4 pg — ABNORMAL LOW (ref 26.0–34.0)
MCHC: 32.2 g/dL (ref 30.0–36.0)
MCV: 79.1 fL — ABNORMAL LOW (ref 80.0–100.0)
Platelets: 90 10*3/uL — ABNORMAL LOW (ref 150–400)
RBC: 5.74 MIL/uL (ref 4.22–5.81)
RDW: 19.1 % — ABNORMAL HIGH (ref 11.5–15.5)
WBC: 5.2 10*3/uL (ref 4.0–10.5)
nRBC: 0 % (ref 0.0–0.2)

## 2023-04-27 LAB — MAGNESIUM: Magnesium: 1.9 mg/dL (ref 1.7–2.4)

## 2023-04-27 LAB — KAPPA/LAMBDA LIGHT CHAINS
Kappa free light chain: 92.2 mg/L — ABNORMAL HIGH (ref 3.3–19.4)
Kappa, lambda light chain ratio: 1.68 — ABNORMAL HIGH (ref 0.26–1.65)
Lambda free light chains: 54.8 mg/L — ABNORMAL HIGH (ref 5.7–26.3)

## 2023-04-27 LAB — CYTOLOGY - NON PAP

## 2023-04-27 MED ORDER — POTASSIUM CHLORIDE CRYS ER 20 MEQ PO TBCR
40.0000 meq | EXTENDED_RELEASE_TABLET | Freq: Two times a day (BID) | ORAL | Status: AC
  Administered 2023-04-27 – 2023-04-28 (×2): 40 meq via ORAL
  Filled 2023-04-27 (×2): qty 2

## 2023-04-27 MED ORDER — GADOBUTROL 1 MMOL/ML IV SOLN
10.0000 mL | Freq: Once | INTRAVENOUS | Status: AC | PRN
  Administered 2023-04-27: 10 mL via INTRAVENOUS

## 2023-04-27 MED ORDER — AMOXICILLIN-POT CLAVULANATE 875-125 MG PO TABS
1.0000 | ORAL_TABLET | Freq: Two times a day (BID) | ORAL | Status: DC
  Administered 2023-04-28 – 2023-04-29 (×3): 1 via ORAL
  Filled 2023-04-27 (×3): qty 1

## 2023-04-27 MED ORDER — LOSARTAN POTASSIUM 50 MG PO TABS
50.0000 mg | ORAL_TABLET | Freq: Every day | ORAL | Status: DC
  Administered 2023-04-27 – 2023-04-30 (×4): 50 mg via ORAL
  Filled 2023-04-27 (×4): qty 1

## 2023-04-27 MED ORDER — MAGNESIUM SULFATE 2 GM/50ML IV SOLN
2.0000 g | Freq: Once | INTRAVENOUS | Status: AC
  Administered 2023-04-27: 2 g via INTRAVENOUS
  Filled 2023-04-27: qty 50

## 2023-04-27 NOTE — Plan of Care (Signed)

## 2023-04-27 NOTE — Progress Notes (Signed)
[facility] CLINIC CARDIOLOGY PROGRESS NOTE       Patient ID: Dennis Church MRN: [id number] DOB/AGE: January 29, 1956 68 y.o.  Admit date: 04/24/2023 Referring Physician Dr. Irena Cords Primary Physician Patient, No Pcp Per  Primary Cardiologist Dr. Lady Gary (last seen 2021) Reason for Consultation LV thrombus  HPI: Dennis Church is a 68 y.o. male  with a past medical history of hypertension, hyperlipidemia, tobacco use, BPH who presented to the ED on 04/24/2023 for generalized weakness. Cardiology was consulted for further evaluation.   Interval history: -Patient seen and examined this morning, resting comfortably in hospital bed with sitter present. -BP and heart rate remained stable.  Continues to have good UOP with IV diuresis.  Renal function remained stable. -He is without complaints of chest pain, shortness of breath this morning.  Review of systems complete and found to be negative unless listed above    Past Medical History:  Diagnosis Date   Anginal pain (HCC)    Coronary artery disease    Hyperlipidemia    Hypertension    Myocardial infarction [facility])    Renal disorder    kidney stones    Past Surgical History:  Procedure Laterality Date   CARDIAC CATHETERIZATION Left 04/25/2015   Procedure: Left Heart Cath and Coronary Angiography;  Surgeon: Dalia Heading, MD;  Location: [facility] INVASIVE CV LAB;  Service: Cardiovascular;  Laterality: Left;   CAROTID ENDARTERECTOMY Left    CORONARY ANGIOPLASTY WITH STENT PLACEMENT     LITHOTRIPSY      Medications Prior to Admission  Medication Sig Dispense Refill Last Dose/Taking   amLODipine (NORVASC) 5 MG tablet Take 5 mg by mouth daily.   04/24/2023 Morning   aspirin EC 81 MG tablet Take 81 mg by mouth daily.   04/24/2023 Morning   atorvastatin (LIPITOR) 80 MG tablet Take 80 mg by mouth every evening.   04/23/2023   clopidogrel (PLAVIX) 75 MG tablet Take 75 mg by mouth daily.   04/24/2023 Morning   isosorbide mononitrate (IMDUR) 60 MG 24 hr  tablet Take 60 mg by mouth daily.   04/24/2023 Morning   metoprolol tartrate (LOPRESSOR) 25 MG tablet Take 25 mg by mouth 2 (two) times daily.   04/24/2023 Morning   nitroGLYCERIN (NITROSTAT) 0.4 MG SL tablet Place 0.4 mg under the tongue every 5 (five) minutes x 3 doses as needed.   Taking As Needed   tamsulosin (FLOMAX) 0.4 MG CAPS capsule Take 1 capsule (0.4 mg total) by mouth daily. 30 capsule 1 04/24/2023 Morning   Social History   Socioeconomic History   Marital status: Single    Spouse name: Not on file   Number of children: Not on file   Years of education: Not on file   Highest education level: Not on file  Occupational History   Not on file  Tobacco Use   Smoking status: Former   Smokeless tobacco: Never  Vaping Use   Vaping status: Never Used  Substance and Sexual Activity   Alcohol use: No   Drug use: Not Currently   Sexual activity: Not on file  Other Topics Concern   Not on file  Social History Narrative   Not on file   Social Drivers of Health   Financial Resource Strain: Not on file  Food Insecurity: No Food Insecurity (04/25/2023)   Hunger Vital Sign    Worried About Running Out of Food in the Last Year: Never true    Ran Out of Food in the Last Year:  Never true  Transportation Needs: No Transportation Needs (04/25/2023)   PRAPARE - Administrator, Civil Service (Medical): No    Lack of Transportation (Non-Medical): No  Physical Activity: Not on file  Stress: Not on file  Social Connections: Socially Isolated (04/25/2023)   Social Connection and Isolation Panel [NHANES]    Frequency of Communication with Friends and Family: Once a week    Frequency of Social Gatherings with Friends and Family: Once a week    Attends Religious Services: Never    Database administrator or Organizations: No    Attends Banker Meetings: Never    Marital Status: Divorced  Catering manager Violence: Not At Risk (04/25/2023)   Humiliation, Afraid, Rape,  and Kick questionnaire    Fear of Current or Ex-Partner: No    Emotionally Abused: No    Physically Abused: No    Sexually Abused: No    History reviewed. No pertinent family history.   Vitals:   04/26/23 2350 04/27/23 0347 04/27/23 0845 04/27/23 1156  BP: 114/64 137/74 133/80 (!) 103/54  Pulse: 77 72 78 76  Resp: 18 18 15 18   Temp: 98 F (36.7 C) 98.8 F (37.1 C) 97.6 F (36.4 C) 98.4 F (36.9 C)  TempSrc:      SpO2: 95% 98% 98% 97%  Weight:  64 kg    Height:        PHYSICAL EXAM General: Ill-appearing male, well nourished, in no acute distress resting comfortably in hospital bed. HEENT: Normocephalic and atraumatic. Neck: No JVD.  Lungs: Normal respiratory effort on 4L [place]. Clear bilaterally to auscultation. No wheezes, crackles, rhonchi.  Heart: HRRR. Normal S1 and S2 without gallops or murmurs.  Abdomen: Non-distended appearing.  Msk: Normal strength and tone for age. Extremities: Warm and well perfused. No clubbing, cyanosis.  1+ pitting edema.  Neuro: Alert and oriented X 3. Psych: Answers questions appropriately.   Labs: Basic Metabolic Panel: Recent Labs    04/24/23 1524 04/25/23 0055 04/26/23 0427 04/27/23 0924  NA 136   < > 136 132*  K 4.1   < > 3.5 3.3*  CL 102   < > 98 94*  CO2 22   < > 27 29  GLUCOSE 100*   < > 76 82  BUN 32*   < > 29* 28*  CREATININE 1.55*   < > 1.55* 1.44*  CALCIUM 9.2   < > 8.6* 8.4*  MG 2.4  --   --  1.9   < > = values in this interval not displayed.   Liver Function Tests: Recent Labs    04/24/23 1524 04/25/23 0055  AST 29 26  ALT 22 22  ALKPHOS 95 87  BILITOT 3.5* 2.5*  PROT 7.1 6.0*  ALBUMIN 2.9* 2.6*   No results for input(s): "LIPASE", "AMYLASE" in the last 72 hours. CBC: Recent Labs    04/26/23 0427 04/27/23 0924  WBC 5.3 5.2  HGB 14.1 14.6  HCT 43.4 45.4  MCV 78.9* 79.1*  PLT 88* 90*   Cardiac Enzymes: Recent Labs    04/24/23 1524  TROPONINIHS 51*   BNP: Recent Labs    04/24/23 1524  BNP  >4,500.0*   D-Dimer: No results for input(s): "DDIMER" in the last 72 hours. Hemoglobin A1C: Recent Labs    04/25/23 0500  HGBA1C 6.7*   Fasting Lipid Panel: Recent Labs    04/25/23 0500  CHOL 127  HDL 27*  LDLCALC 84  TRIG 82  CHOLHDL 4.7   Thyroid Function Tests: Recent Labs    04/24/23 1651  TSH 3.359   Anemia Panel: No results for input(s): "VITAMINB12", "FOLATE", "FERRITIN", "TIBC", "IRON", "RETICCTPCT" in the last 72 hours.   Radiology: ECHOCARDIOGRAM COMPLETE Result Date: 04/26/2023    ECHOCARDIOGRAM REPORT   Patient Name:   MARTESE HADERLIE Himmelberger Date of Exam: 04/25/2023 Medical Rec #:  [id number]     Height:       65.0 in Accession #:    [id number]    Weight:       157.0 lb Date of Birth:  [birth year]     BSA:          1.785 m Patient Age:    67 years      BP:           117/105 mmHg Patient Gender: M             HR:           81 bpm. Exam Location:  [facility] Procedure: 2D Echo, Cardiac Doppler, Color Doppler and Intracardiac            Opacification Agent Indications:     Other abnormalities of the heart  History:         Patient has no prior history of Echocardiogram examinations.                  CAD, Stroke; Risk Factors:Hypertension, Dyslipidemia and                  Current Smoker. CKD, Mural thrombus.  Sonographer:     Mikki Harbor Referring Phys:  [id number] Sonnie Pawloski Diagnosing Phys: Rozell Searing Custovic IMPRESSIONS  1. Left ventricular ejection fraction, by estimation, is <20%. The left ventricle has severely decreased function. The left ventricle demonstrates global hypokinesis. The left ventricular internal cavity size was moderately dilated. Left ventricular diastolic parameters are consistent with Grade I diastolic dysfunction (impaired relaxation).  2. Severe RV volume and pressure overload. Right ventricular systolic function is severely reduced. The right ventricular size is moderately enlarged. There is moderately elevated pulmonary artery systolic pressure. The estimated  right ventricular systolic pressure is 50.0 mmHg.  3. Left atrial size was severely dilated.  4. Right atrial size was severely dilated.  5. The mitral valve is grossly normal. Moderate mitral valve regurgitation.  6. Tricuspid valve regurgitation is moderate.  7. The aortic valve is grossly normal. Aortic valve regurgitation is trivial. Aortic valve sclerosis/calcification is present, without any evidence of aortic stenosis. FINDINGS  Left Ventricle: Left ventricular ejection fraction, by estimation, is <20%. The left ventricle has severely decreased function. The left ventricle demonstrates global hypokinesis. Definity contrast agent was given IV to delineate the left ventricular endocardial borders. The left ventricular internal cavity size was moderately dilated. There is borderline left ventricular hypertrophy. Left ventricular diastolic parameters are consistent with Grade I diastolic dysfunction (impaired relaxation). Right Ventricle: Severe RV volume and pressure overload. The right ventricular size is moderately enlarged. No increase in right ventricular wall thickness. Right ventricular systolic function is severely reduced. There is moderately elevated pulmonary artery systolic pressure. The tricuspid regurgitant velocity is 2.96 m/s, and with an assumed right atrial pressure of 15 mmHg, the estimated right ventricular systolic pressure is 50.0 mmHg. Left Atrium: Left atrial size was severely dilated. Right Atrium: Right atrial size was severely dilated. Pericardium: There is no evidence of pericardial effusion. Mitral Valve: The mitral valve is grossly normal. Moderate mitral valve regurgitation. MV  peak gradient, 2.3 mmHg. The mean mitral valve gradient is 1.0 mmHg. Tricuspid Valve: The tricuspid valve is grossly normal. Tricuspid valve regurgitation is moderate. Aortic Valve: The aortic valve is grossly normal. Aortic valve regurgitation is trivial. Aortic valve sclerosis/calcification is present,  without any evidence of aortic stenosis. Aortic valve mean gradient measures 8.0 mmHg. Aortic valve peak gradient measures 14.3 mmHg. Aortic valve area, by VTI measures 0.95 cm. Pulmonic Valve: The pulmonic valve was grossly normal. Pulmonic valve regurgitation is not visualized. Aorta: The aortic root is normal in size and structure. IAS/Shunts: No atrial level shunt detected by color flow Doppler.  LEFT VENTRICLE PLAX 2D LVIDd:         4.20 cm LVIDs:         3.20 cm LV PW:         1.30 cm LV IVS:        1.30 cm LVOT diam:     1.90 cm LV SV:         35 LV SV Index:   20 LVOT Area:     2.84 cm  RIGHT VENTRICLE RV Basal diam:  4.45 cm RV Mid diam:    4.30 cm RV S prime:     6.66 cm/s LEFT ATRIUM             Index        RIGHT ATRIUM           Index LA diam:        4.40 cm 2.47 cm/m   RA Area:     22.10 cm LA Vol (A2C):   88.9 ml 49.81 ml/m  RA Volume:   79.50 ml  44.54 ml/m LA Vol (A4C):   79.3 ml 44.43 ml/m LA Biplane Vol: 85.7 ml 48.02 ml/m  AORTIC VALVE                     PULMONIC VALVE AV Area (Vmax):    1.00 cm      PV Vmax:       0.92 m/s AV Area (Vmean):   0.95 cm      PV Peak grad:  3.4 mmHg AV Area (VTI):     0.95 cm AV Vmax:           189.00 cm/s AV Vmean:          127.000 cm/s AV VTI:            0.369 m AV Peak Grad:      14.3 mmHg AV Mean Grad:      8.0 mmHg LVOT Vmax:         66.60 cm/s LVOT Vmean:        42.500 cm/s LVOT VTI:          0.124 m LVOT/AV VTI ratio: 0.34  AORTA Ao Root diam: 3.10 cm MITRAL VALVE               TRICUSPID VALVE MV Area (PHT): 3.10 cm    TR Peak grad:   35.0 mmHg MV Area VTI:   1.44 cm    TR Vmax:        296.00 cm/s MV Peak grad:  2.3 mmHg MV Mean grad:  1.0 mmHg    SHUNTS MV Vmax:       0.76 m/s    Systemic VTI:  0.12 m MV Vmean:      37.1 cm/s   Systemic Diam: 1.90 cm MV Decel Time: 245 msec  MV E velocity: 73.70 cm/s MV A velocity: 64.30 cm/s MV E/A ratio:  1.15 Designer, multimedia signed by Clotilde Dieter Signature Date/Time: 04/26/2023/9:17:35 AM     Final    CT ANGIO HEAD NECK W WO CM Result Date: 04/25/2023 CLINICAL DATA:  Neuro deficit, acute, stroke suspected EXAM: CT ANGIOGRAPHY HEAD AND NECK WITH AND WITHOUT CONTRAST TECHNIQUE: Multidetector CT imaging of the head and neck was performed using the standard protocol during bolus administration of intravenous contrast. Multiplanar CT image reconstructions and MIPs were obtained to evaluate the vascular anatomy. Carotid stenosis measurements (when applicable) are obtained utilizing NASCET criteria, using the distal internal carotid diameter as the denominator. RADIATION DOSE REDUCTION: This exam was performed according to the departmental dose-optimization program which includes automated exposure control, adjustment of the mA and/or kV according to patient size and/or use of iterative reconstruction technique. CONTRAST:  75mL OMNIPAQUE IOHEXOL 350 MG/ML SOLN COMPARISON:  Same day brain MR FINDINGS: CT HEAD FINDINGS Brain: No hemorrhage. No hydrocephalus. No extra-axial fluid collection. No mass effect. No mass lesion. 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 Generalized volume loss without lobar predominance. Vascular: No hyperdense vessel or unexpected calcification. Skull: Normal. Negative for fracture or focal lesion. Sinuses/Orbits: No middle ear or mastoid effusion. Paranasal sinuses are clear. Bilateral lens replacement. Orbits are otherwise unremarkable. Other: None. Review of the MIP images confirms the above findings CTA NECK FINDINGS Aortic arch: Standard branching. Imaged portion shows no evidence of aneurysm or dissection. Mild-to-moderate narrowing in the right subclavian artery Right carotid system: Moderate narrowing (approximately 50%) in the distal cervical ICA on the right (series 12, image 103). Left carotid system: No evidence of dissection, stenosis (50% or greater), or occlusion. Mild narrowing in the distal cervical ICA on the left. 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 Moderate to severe focal narrowing in the V1 segment of the left vertebral artery which arises from the aortic arch. Moderate narrowing in the V2 segment of the left vertebral artery. Skeleton: Negative. Other neck: Negative. Upper chest: Centrilobular and paraseptal emphysema. Small left pleural effusion. 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 Moderate stenosis in the cavernous segment of the left ICA (series 14, image 83). Moderate narrowing in the distal A2 segment of the right ACA (series 10, image 54). 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 No aneurysm. No vascular malformation. Venous sinuses: As permitted by contrast timing, patent. Anatomic variants: None Review of the MIP images confirms the above findings IMPRESSION: 1. No hemorrhage or CT evidence of an acute cortical infarct. Infarcts seen on same day brain MRI are not visualized on this exam due to CT technique. 2. No emergent large vessel occlusion. 3. 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 There is sever stenosis in the V1 segment of the left vertebral artery. 4. Moderate narrowing (approximately 50%) in the distal cervical ICA on the right. 5. Moderate to severe narrowing in the cavernous segments of bilateral ICAs. Emphysema (ICD10-J43.9). Electronically Signed   By: Lorenza Cambridge M.D.   On: 04/25/2023 17:52   US THORACENTESIS ASP PLEURAL SPACE W/IMG GUIDE Result Date: 04/25/2023 INDICATION: Patient is a 68 y/o male with history of CAD, HLD, HTN, MI, BPH and tobacco abuse. Patient presents for a diagnostic and therapeutic thoracentesis due to a right  pleural effusion. EXAM: ULTRASOUND GUIDED RIGHT THORACENTESIS MEDICATIONS: 12mL of lidocaine 1% COMPLICATIONS: None immediate. PROCEDURE: An  ultrasound guided thoracentesis was thoroughly discussed with the patient and questions answered. The benefits, risks, alternatives and complications were also discussed. The patient understands and wishes to proceed with the procedure. Written consent was obtained. Ultrasound was performed to localize and mark an adequate pocket of fluid in the right chest. The area was then prepped and draped in the normal sterile fashion. 1% Lidocaine was used for local anesthesia. Under ultrasound guidance a 6 Fr Safe-T-Centesis catheter was introduced. Thoracentesis was performed. The catheter was removed and a dressing applied. FINDINGS: A total of approximately 1.9L of clear, yellow fluid was removed. Samples were sent to the laboratory as requested by the clinical team. IMPRESSION: Successful ultrasound guided right thoracentesis yielding 1.9L of pleural fluid. Performed by Philipp Ovens PA-C Electronically Signed   By: Malachy Moan M.D.   On: 04/25/2023 16:30   DG Chest Port 1 View Result Date: 04/25/2023 CLINICAL DATA:  68 year old male status post right thoracentesis EXAM: PORTABLE CHEST 1 VIEW COMPARISON:  Prior chest x-ray 04/24/2023 FINDINGS: No evidence of pneumothorax following right-sided thoracentesis. Significant interval reduction right pleural effusion. Stable cardiomegaly and mild pulmonary edema. IMPRESSION: No evidence of pneumothorax or other complication following right-sided thoracentesis. Electronically Signed   By: Malachy Moan M.D.   On: 04/25/2023 16:19   MR BRAIN W WO CONTRAST Result Date: 04/25/2023 CLINICAL DATA:  CNS neoplasm staging EXAM: MRI HEAD WITHOUT AND WITH CONTRAST TECHNIQUE: Multiplanar, multiecho pulse sequences of the brain and surrounding structures were obtained without and with intravenous contrast. CONTRAST:  7.68mL GADAVIST GADOBUTROL 1 MMOL/ML IV SOLN COMPARISON:  Head CT from yesterday FINDINGS: Brain: 2 punctate areas of restricted diffusion at the left upper  putamen. Chronic infarcts scattered along the cerebral cortex, especially left superior frontal and right occipital. Ischemic gliosis in the cerebral [place] matter with chronic lacunes in the bilateral thalamus. Ischemic gliosis is extensive in the cerebral [place]oghue matter. Brain atrophy is present. There are a few chronic blood products in the deep cerebellum, brainstem, left periatrial Ehrmann matter, and left thalamus which are likely related to chronic small vessel disease. No abnormal enhancement Vascular: Absent right vertebral flow void just beyond the dura. Skull and upper cervical spine: Normal marrow signal Sinuses/Orbits: Negative IMPRESSION: 1. Two small acute infarcts in the left putamen. 2. 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 3. Slow or absent flow in the right vertebral artery, likely chronic in the absence of acute infarct in this distribution. 4. No evidence of metastatic disease. Electronically Signed   By: Tiburcio Pea M.D.   On: 04/25/2023 09:17   CT Angio Chest Pulmonary Embolism (PE) W or WO Contrast Result Date: 04/24/2023 CLINICAL DATA:  Weight loss, fatigue, weakness, sepsis EXAM: CT ANGIOGRAPHY CHEST CT ABDOMEN AND PELVIS WITH CONTRAST TECHNIQUE: Multidetector CT imaging of the chest was performed using the standard protocol during bolus administration of intravenous contrast. Multiplanar CT image reconstructions and MIPs were obtained to evaluate the vascular anatomy. Multidetector CT imaging of the abdomen and pelvis was performed using the standard protocol during bolus administration of intravenous contrast. RADIATION DOSE REDUCTION: This exam was performed according to the departmental dose-optimization program which includes automated exposure control, adjustment of the mA and/or kV according to patient size and/or use of iterative reconstruction technique. CONTRAST:  75mL OMNIPAQUE IOHEXOL 350 MG/ML SOLN COMPARISON:  04/24/2023, 06/30/2020, 07/12/2016  FINDINGS: CTA CHEST FINDINGS Cardiovascular: There is  technically adequate opacification of the main pulmonary artery. Mixing artifact is identified. There is decreased contrast enhancement of the right middle and right lower lobe pulmonary arteries, which could be due to pulmonary emboli versus decreased perfusion due to presumed underlying chronic consolidation. No other filling defects are identified. The heart is enlarged, with mild left ventricular dilatation. Mural thrombus is seen within the left ventricular apex, reference image 7 of series 9 on the abdominal CT exam. There is reflux of contrast into the hepatic veins, suggesting an element of right sided cardiac dysfunction. No pericardial effusion. Normal caliber of the thoracic aorta. Evaluation of the aortic lumen is limited due to timing of the contrast bolus. Atherosclerosis of the aorta and coronary vasculature. Mediastinum/Nodes: There are numerous subcentimeter mediastinal and hilar lymph nodes, without evidence of pathologic adenopathy. Thyroid, trachea, and esophagus are grossly normal. Lungs/Pleura: There is a large right pleural effusion, volume estimated in excess of 2 L. There is rounded masslike consolidation of the right lower lobe in the infrahilar region, measuring up to 3.6 cm reference image 91/2. Neoplasm cannot be excluded. Peripheral consolidation and volume loss elsewhere within the right lower lobe consistent with atelectasis. Indeterminate 4 mm left upper lobe pulmonary nodule reference image 19/3. Trace left pleural effusion. Upper lobe predominant emphysema.  No pneumothorax. Musculoskeletal: No acute or destructive bony abnormalities. Reconstructed images demonstrate no additional findings. Review of the MIP images confirms the above findings. CT ABDOMEN and PELVIS FINDINGS Hepatobiliary: No focal liver abnormality is seen. No gallstones, gallbladder wall thickening, or biliary dilatation. Pancreas: Unremarkable. No pancreatic  ductal dilatation or surrounding inflammatory changes. Spleen: Normal in size without focal abnormality. Adrenals/Urinary Tract: Mild bilateral renal cortical atrophy and scattered areas of renal cortical scarring, right greater than left. No urinary tract calculi or obstructive uropathy. Bladder is moderately distended, with multiple bladder diverticula consistent with chronic bladder outlet obstruction. Nonobstructing 2.1 cm bladder calculus layers dependently. The adrenals are unremarkable. Stomach/Bowel: No bowel obstruction or ileus. Distal colonic diverticulosis without diverticulitis. No bowel wall thickening or inflammatory change. Vascular/Lymphatic: There is a 5.9 cm infrarenal abdominal aortic aneurysm, with no evidence of leak or rupture. Chronic calcification is seen within the ventral aspect of the thrombosed portion of the aneurysm. Diffuse aortic atherosclerosis. No pathologic adenopathy. Reproductive: Marked enlargement of the prostate measuring 5.8 x 5.6 cm. Other: Moderate ascites, most pronounced within the bilateral flanks. No free intraperitoneal gas. No abdominal wall hernia. Musculoskeletal: Diffuse body wall edema. No acute or destructive bony abnormalities. Reconstructed images demonstrate no additional findings. Review of the MIP images confirms the above findings. IMPRESSION: Chest: 1. Decreased enhancement of the right middle and right lower lobe pulmonary arteries, without definitive filling defect identified. Differential diagnosis would include decreased perfusion due to chronic consolidation versus pulmonary embolus. 2. Rounded masslike consolidation in the right lower lobe infrahilar region measuring up to 3.6 cm. The appearance is concerning for neoplasm, though underlying infection cannot be excluded. Pulmonology follow-up recommended. 3. Large right pleural effusion volume estimated in excess of 2 L. 4. Volume loss and peripheral consolidation within the right lower lobe,  consistent with atelectasis. It is unclear whether this is postobstructive collapse or sys compressive atelectasis from the right pleural effusion. 5. Cardiomegaly, with prominent left ventricular dilatation. Small mural thrombi at the left ventricular apex best seen on the delayed contrast series on the corresponding CT abdomen exam. Echocardiography recommended. 6. Reflux of contrast into the hepatic veins consistent with cardiac dysfunction. 7.  Aortic Atherosclerosis (ICD10-I70.0). Abdomen/pelvis:  1. 5.9 cm infrarenal abdominal aortic aneurysm. No evidence of aortic leak or rupture. Recommend follow-up CT or MR as appropriate in 6 months and referral to or continued care with vascular specialist. (Ref.: J Vasc Surg. 2018; 67:2-77 and J Am Coll Radiol 2013;10(10):789-794.) 2. Moderate ascites. 3. Enlarged prostate, with evidence of chronic bladder outlet obstruction. 4. Nonobstructing 2.1 cm bladder calculus. 5. Sigmoid diverticulosis without diverticulitis. 6.  Aortic Atherosclerosis (ICD10-I70.0). 7. Diffuse body wall edema. Critical Value/emergent results were called by telephone at the time of interpretation on 04/24/2023 at 550 pm to provider [facility] , who verbally acknowledged these results. Electronically Signed   By: Sharlet Salina M.D.   On: 04/24/2023 18:02   CT ABDOMEN PELVIS W CONTRAST Result Date: 04/24/2023 CLINICAL DATA:  Weight loss, fatigue, weakness, sepsis EXAM: CT ANGIOGRAPHY CHEST CT ABDOMEN AND PELVIS WITH CONTRAST TECHNIQUE: Multidetector CT imaging of the chest was performed using the standard protocol during bolus administration of intravenous contrast. Multiplanar CT image reconstructions and MIPs were obtained to evaluate the vascular anatomy. Multidetector CT imaging of the abdomen and pelvis was performed using the standard protocol during bolus administration of intravenous contrast. RADIATION DOSE REDUCTION: This exam was performed according to the departmental  dose-optimization program which includes automated exposure control, adjustment of the mA and/or kV according to patient size and/or use of iterative reconstruction technique. CONTRAST:  75mL OMNIPAQUE IOHEXOL 350 MG/ML SOLN COMPARISON:  04/24/2023, 06/30/2020, 07/12/2016 FINDINGS: CTA CHEST FINDINGS Cardiovascular: There is technically adequate opacification of the main pulmonary artery. Mixing artifact is identified. There is decreased contrast enhancement of the right middle and right lower lobe pulmonary arteries, which could be due to pulmonary emboli versus decreased perfusion due to presumed underlying chronic consolidation. No other filling defects are identified. The heart is enlarged, with mild left ventricular dilatation. Mural thrombus is seen within the left ventricular apex, reference image 7 of series 9 on the abdominal CT exam. There is reflux of contrast into the hepatic veins, suggesting an element of right sided cardiac dysfunction. No pericardial effusion. Normal caliber of the thoracic aorta. Evaluation of the aortic lumen is limited due to timing of the contrast bolus. Atherosclerosis of the aorta and coronary vasculature. Mediastinum/Nodes: There are numerous subcentimeter mediastinal and hilar lymph nodes, without evidence of pathologic adenopathy. Thyroid, trachea, and esophagus are grossly normal. Lungs/Pleura: There is a large right pleural effusion, volume estimated in excess of 2 L. There is rounded masslike consolidation of the right lower lobe in the infrahilar region, measuring up to 3.6 cm reference image 91/2. Neoplasm cannot be excluded. Peripheral consolidation and volume loss elsewhere within the right lower lobe consistent with atelectasis. Indeterminate 4 mm left upper lobe pulmonary nodule reference image 19/3. Trace left pleural effusion. Upper lobe predominant emphysema.  No pneumothorax. Musculoskeletal: No acute or destructive bony abnormalities. Reconstructed images  demonstrate no additional findings. Review of the MIP images confirms the above findings. CT ABDOMEN and PELVIS FINDINGS Hepatobiliary: No focal liver abnormality is seen. No gallstones, gallbladder wall thickening, or biliary dilatation. Pancreas: Unremarkable. No pancreatic ductal dilatation or surrounding inflammatory changes. Spleen: Normal in size without focal abnormality. Adrenals/Urinary Tract: Mild bilateral renal cortical atrophy and scattered areas of renal cortical scarring, right greater than left. No urinary tract calculi or obstructive uropathy. Bladder is moderately distended, with multiple bladder diverticula consistent with chronic bladder outlet obstruction. Nonobstructing 2.1 cm bladder calculus layers dependently. The adrenals are unremarkable. Stomach/Bowel: No bowel obstruction or ileus. Distal colonic diverticulosis without diverticulitis.  No bowel wall thickening or inflammatory change. Vascular/Lymphatic: There is a 5.9 cm infrarenal abdominal aortic aneurysm, with no evidence of leak or rupture. Chronic calcification is seen within the ventral aspect of the thrombosed portion of the aneurysm. Diffuse aortic atherosclerosis. No pathologic adenopathy. Reproductive: Marked enlargement of the prostate measuring 5.8 x 5.6 cm. Other: Moderate ascites, most pronounced within the bilateral flanks. No free intraperitoneal gas. No abdominal wall hernia. Musculoskeletal: Diffuse body wall edema. No acute or destructive bony abnormalities. Reconstructed images demonstrate no additional findings. Review of the MIP images confirms the above findings. IMPRESSION: Chest: 1. Decreased enhancement of the right middle and right lower lobe pulmonary arteries, without definitive filling defect identified. Differential diagnosis would include decreased perfusion due to chronic consolidation versus pulmonary embolus. 2. Rounded masslike consolidation in the right lower lobe infrahilar region measuring up to 3.6  cm. The appearance is concerning for neoplasm, though underlying infection cannot be excluded. Pulmonology follow-up recommended. 3. Large right pleural effusion volume estimated in excess of 2 L. 4. Volume loss and peripheral consolidation within the right lower lobe, consistent with atelectasis. It is unclear whether this is postobstructive collapse or sys compressive atelectasis from the right pleural effusion. 5. Cardiomegaly, with prominent left ventricular dilatation. Small mural thrombi at the left ventricular apex best seen on the delayed contrast series on the corresponding CT abdomen exam. Echocardiography recommended. 6. Reflux of contrast into the hepatic veins consistent with cardiac dysfunction. 7.  Aortic Atherosclerosis (ICD10-I70.0). Abdomen/pelvis: 1. 5.9 cm infrarenal abdominal aortic aneurysm. No evidence of aortic leak or rupture. Recommend follow-up CT or MR as appropriate in 6 months and referral to or continued care with vascular specialist. (Ref.: J Vasc Surg. 2018; 67:2-77 and J Am Coll Radiol 2013;10(10):789-794.) 2. Moderate ascites. 3. Enlarged prostate, with evidence of chronic bladder outlet obstruction. 4. Nonobstructing 2.1 cm bladder calculus. 5. Sigmoid diverticulosis without diverticulitis. 6.  Aortic Atherosclerosis (ICD10-I70.0). 7. Diffuse body wall edema. Critical Value/emergent results were called by telephone at the time of interpretation on 04/24/2023 at 550 pm to provider [facility] , who verbally acknowledged these results. Electronically Signed   By: Sharlet Salina M.D.   On: 04/24/2023 18:02   CT Head Wo Contrast Result Date: 04/24/2023 CLINICAL DATA:  Losing weight, decreased energy for months. EXAM: CT HEAD WITHOUT CONTRAST TECHNIQUE: Contiguous axial images were obtained from the base of the skull through the vertex without intravenous contrast. RADIATION DOSE REDUCTION: This exam was performed according to the departmental dose-optimization program which  includes automated exposure control, adjustment of the mA and/or kV according to patient size and/or use of iterative reconstruction technique. COMPARISON:  CT head dated 07/24/2004. FINDINGS: Brain: No evidence of acute infarction, hemorrhage, hydrocephalus, extra-axial collection or mass lesion/mass effect. There is small to moderate volume encephalomalacia of the left frontal lobe superomedially and small-to-moderate volume encephalomalacia of the right occipital lobe. There is mild cerebral volume loss with associated ex vacuo dilatation. Periventricular Chargois matter hypoattenuation likely represents chronic small vessel ischemic disease. Vascular: There are vascular calcifications in the carotid siphons. Skull: Normal. Negative for fracture or focal lesion. Sinuses/Orbits: No acute finding. Other: None. IMPRESSION: 1. No acute intracranial process. 2. Small to moderate volume encephalomalacia of the left frontal lobe and right occipital lobe. Electronically Signed   By: Romona Curls M.D.   On: 04/24/2023 17:48   DG Chest 2 View Result Date: 04/24/2023 CLINICAL DATA:  Weakness. EXAM: CHEST - 2 VIEW COMPARISON:  07/12/2016. FINDINGS: Cardiac silhouette borderline enlarged.  Left coronary artery stent. No mediastinal or hilar masses. No convincing adenopathy. Moderate right and small left pleural effusions. Additional opacity at the right lung base which may reflect atelectasis or pneumonia. Vascular congestion and bilateral interstitial thickening. Skeletal structures are grossly intact. IMPRESSION: 1. Findings consistent with congestive heart failure. Moderate right and small left pleural effusions. 2. Right lung base opacity may reflect atelectasis or pneumonia. Electronically Signed   By: Amie Portland M.D.   On: 04/24/2023 16:11    ECHO as above  TELEMETRY reviewed by me 04/27/2023: Sinus rhythm PACs PVCs rate 60s  EKG reviewed by me: Sinus rhythm rate 88 bpm  Data reviewed by me 04/27/2023: last  24h vitals tele labs imaging I/O hospitalist progress note, neurology notes Principal Problem:   Acute stroke due to ischemia [facility]) Active Problems:   Benign prostatic hyperplasia   Benign essential hypertension   Tobacco use disorder   Coronary artery disease involving native coronary artery of native heart with angina pectoris (HCC)   Acute respiratory failure with hypoxia (HCC)   CKD stage 3a, GFR 45-59 ml/min (HCC)   Severe sepsis (HCC)   Pleural effusion on right   Mass of right lung   LV (left ventricular) mural thrombus   Acute on chronic systolic CHF (congestive heart failure) (HCC)   AAA (abdominal aortic aneurysm) (HCC)   Involuntary commitment    ASSESSMENT AND PLAN:  MARVIN CUCINELLA is a 68 y.o. male  with a past medical history of hypertension, hyperlipidemia, tobacco use, BPH who presented to the ED on 04/24/2023 for generalized weakness. Cardiology was consulted for further evaluation.   # Acute CVA # LV apical thrombus # Acute systolic heart failure # Hypertension Patient initially presented for generalized weakness found to have LV apical thrombus on CTA chest.  Also with significant lower extremity edema and dyspnea on exertion for the last few weeks.  BNP elevated greater than 4500.  MRI head today with 2 acute infarcts.  Echo read this morning demonstrates EF less than 20% with global hypokinesis. -Continue eliquis.  Continue aspirin 81 mg daily.  Neurology following -Continue IV Lasix 40 mg twice daily. -Continue spironolactone 25 mg daily, losartan 50 mg daily.  Continue Imdur 60 mg daily.  Consider further additions to GDMT pending BP and renal function. -Advanced heart failure following.  -Continue to monitor renal function closely with diuresis. -Will likely need LifeVest prior to discharge.  # RLL lung mass # Large R pleural effusion Patient with right lower lobe lung mass noted on CTA chest as well as large right pleural effusion.  Concern for  malignancy. -S/p thoracentesis 04/25/2023 with 1 L yielded -Further management per primary team.   This patient's plan of care was discussed and created with Dr. Melton Alar and she is in agreement.  Signed: Gale Journey, PA-C  04/27/2023, 12:23 PM [facility]

## 2023-04-27 NOTE — Progress Notes (Signed)
Transported to MRI with Financial risk analyst and sitter at [phone] via bed.

## 2023-04-27 NOTE — Progress Notes (Signed)
Clean catch urine obtained at the beginning of this shift and sent to the lab to be processed per physician order.

## 2023-04-27 NOTE — Progress Notes (Addendum)
[facility]  - [place] at [facility]   PATIENT NAME: Dennis Church    MR#:  [id number]  DATE OF BIRTH:  [birth year]  SUBJECTIVE:  sitter at bedside. No family at bedside. Spoke with patient's son Raed Iser on the phone. Patient is now agreeable and much calmer. Updated his medical issues and need for him to stay in the hospital he is in agreement. He tells me he will not leave the hospital. Will discontinue IVC tomorrow.    VITALS:  Blood pressure (!) 103/54, pulse 76, temperature 98.4 F (36.9 C), resp. rate 18, height 5\' 5"  (1.651 m), weight 64 kg, SpO2 97%.  PHYSICAL EXAMINATION:   GENERAL:  68 y.o.-year-old patient with no acute distress. Chronically ill thin malnourished LUNGS: distant breath sounds bilaterally, no wheezing CARDIOVASCULAR: S1, S2 normal. No murmur   ABDOMEN: Soft, nontender, nondistended. Bowel sounds present.  EXTREMITIES: No  edema b/l.    NEUROLOGIC: nonfocal  patient is alert and awake   LABORATORY PANEL:  CBC Recent Labs  Lab 04/27/23 0924  WBC 5.2  HGB 14.6  HCT 45.4  PLT 90*    Chemistries  Recent Labs  Lab 04/25/23 0055 04/26/23 0427 04/27/23 0924  NA 136   < > 132*  K 4.1   < > 3.3*  CL 100   < > 94*  CO2 23   < > 29  GLUCOSE 96   < > 82  BUN 29*   < > 28*  CREATININE 1.47*   < > 1.44*  CALCIUM 8.5*   < > 8.4*  MG  --   --  1.9  AST 26  --   --   ALT 22  --   --   ALKPHOS 87  --   --   BILITOT 2.5*  --   --    < > = values in this interval not displayed.   Cardiac Enzymes No results for input(s): "TROPONINI" in the last 168 hours. RADIOLOGY:  MR CARDIAC MORPHOLOGY W WO CONTRAST Result Date: 04/27/2023 CLINICAL DATA:  Cardiomyopathy, LV thrombus EXAM: CARDIAC MRI TECHNIQUE: The patient was scanned on a 1.5 Tesla Siemens magnet. A dedicated cardiac coil was used. Functional imaging was done using Fiesta sequences. 2,3, and 4 chamber views were done to assess for RWMA's. Modified Simpson's rule using a short  axis stack was used to calculate an ejection fraction on a dedicated work [employer], [employer]. The patient received 10 cc of Gadavist. After 10 minutes inversion recovery sequences were used to assess for infiltration and scar tissue. Velocity flow mapping performed in the ascending aorta and main pulmonary artery. CONTRAST:  10 cc  of Gadavist FINDINGS: 1. Normal left ventricular size, mild LV thickness and systolic function (LVEF =23%). There is global hypokinesis. There is near transmural (75%) subendocardial late gadolinium enhancement in the left ventricular myocardial basal-mid inferior wall. There is subendocardial LGE (50%) in the mid-apical LV segments involving the anterior, anteroseptal and apical walls. Total LV LGE scar 34g, approximately 20% of total LV myocardial mass. LVEDV: 194 ml LVESV: 150 ml SV: 44 ml CO: 3.2 L/min Myocardial mass: 167 g 2. Normal right ventricular size and thickness. Mildly reduced LV systolic function (RVEF =37%). There are no regional wall motion abnormalities. 3.  Mildly dilated left and right atrium. 4. Normal size of the aortic root, ascending aorta and pulmonary artery. 5.  Mild mitral and tricuspid regurgitation. 6.  Normal pericardium.  No pericardial effusion. IMPRESSION: 1.  Severely reduced LV systolic function.  LVEF 23%. 2. Near transmural (75%) subendocardial LGE in the LV basal-mid inferior wall (non viable). 3. Subendocardial LGE (50%) in the mid-apical LV segments involving the anterior, anteroseptal and apical walls (appear viable). 4. Total LV LGE/scar is 34g, approximately 20% of total LV myocardial mass. 5.  Mildly reduced RV function. 6. 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 Electronically Signed   By: Debbe Odea M.D.   On: 04/27/2023 13:40   MR CARDIAC VELOCITY FLOW MAP Result Date: 04/27/2023 CLINICAL DATA:  Cardiomyopathy, LV thrombus EXAM: CARDIAC MRI TECHNIQUE: The  patient was scanned on a 1.5 Tesla Siemens magnet. A dedicated cardiac coil was used. Functional imaging was done using Fiesta sequences. 2,3, and 4 chamber views were done to assess for RWMA's. Modified Simpson's rule using a short axis stack was used to calculate an ejection fraction on a dedicated work [employer] officer, [employer]. The patient received 10 cc of Gadavist. After 10 minutes inversion recovery sequences were used to assess for infiltration and scar tissue. Velocity flow mapping performed in the ascending aorta and main pulmonary artery. CONTRAST:  10 cc  of Gadavist FINDINGS: 1. Normal left ventricular size, mild LV thickness and systolic function (LVEF =23%). There is global hypokinesis. There is near transmural (75%) subendocardial late gadolinium enhancement in the left ventricular myocardial basal-mid inferior wall. There is subendocardial LGE (50%) in the mid-apical LV segments involving the anterior, anteroseptal and apical walls. Total LV LGE scar 34g, approximately 20% of total LV myocardial mass. LVEDV: 194 ml LVESV: 150 ml SV: 44 ml CO: 3.2 L/min Myocardial mass: 167 g 2. Normal right ventricular size and thickness. Mildly reduced LV systolic function (RVEF =37%). There are no regional wall motion abnormalities. 3.  Mildly dilated left and right atrium. 4. Normal size of the aortic root, ascending aorta and pulmonary artery. 5.  Mild mitral and tricuspid regurgitation. 6.  Normal pericardium.  No pericardial effusion. IMPRESSION: 1.  Severely reduced LV systolic function.  LVEF 23%. 2. Near transmural (75%) subendocardial LGE in the LV basal-mid inferior wall (non viable). 3. Subendocardial LGE (50%) in the mid-apical LV segments involving the anterior, anteroseptal and apical walls (appear viable). 4. Total LV LGE/scar is 34g, approximately 20% of total LV myocardial mass. 5.  Mildly reduced RV function. 6. Etiology for cardiomyopathy appears ischemic with prior infarcts involving the LAD  and RCA territories. Electronically Signed   By: Debbe Odea M.D.   On: 04/27/2023 13:40   MR CARDIAC VELOCITY FLOW MAP Result Date: 04/27/2023 CLINICAL DATA:  Cardiomyopathy, LV thrombus EXAM: CARDIAC MRI TECHNIQUE: The patient was scanned on a 1.5 Tesla Siemens magnet. A dedicated cardiac coil was used. Functional imaging was done using Fiesta sequences. 2,3, and 4 chamber views were done to assess for RWMA's. Modified Simpson's rule using a short axis stack was used to calculate an ejection fraction on a dedicated work [employer] officer, [employer]. The patient received 10 cc of Gadavist. After 10 minutes inversion recovery sequences were used to assess for infiltration and scar tissue. Velocity flow mapping performed in the ascending aorta and main pulmonary artery. CONTRAST:  10 cc  of Gadavist FINDINGS: 1. Normal left ventricular size, mild LV thickness and systolic function (LVEF =23%). There is global hypokinesis. There is near transmural (75%) subendocardial late gadolinium enhancement in the left ventricular myocardial basal-mid inferior wall. There is subendocardial LGE (50%) in the mid-apical LV segments involving the  anterior, anteroseptal and apical walls. Total LV LGE scar 34g, approximately 20% of total LV myocardial mass. LVEDV: 194 ml LVESV: 150 ml SV: 44 ml CO: 3.2 L/min Myocardial mass: 167 g 2. Normal right ventricular size and thickness. Mildly reduced LV systolic function (RVEF =37%). There are no regional wall motion abnormalities. 3.  Mildly dilated left and right atrium. 4. Normal size of the aortic root, ascending aorta and pulmonary artery. 5.  Mild mitral and tricuspid regurgitation. 6.  Normal pericardium.  No pericardial effusion. IMPRESSION: 1.  Severely reduced LV systolic function.  LVEF 23%. 2. Near transmural (75%) subendocardial LGE in the LV basal-mid inferior wall (non viable). 3. Subendocardial LGE (50%) in the mid-apical LV segments involving the anterior,  anteroseptal and apical walls (appear viable). 4. Total LV LGE/scar is 34g, approximately 20% of total LV myocardial mass. 5.  Mildly reduced RV function. 6. 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 Electronically Signed   By: Debbe Odea M.D.   On: 04/27/2023 13:40   CT ANGIO HEAD NECK W WO CM Result Date: 04/25/2023 CLINICAL DATA:  Neuro deficit, acute, stroke suspected EXAM: CT ANGIOGRAPHY HEAD AND NECK WITH AND WITHOUT CONTRAST TECHNIQUE: Multidetector CT imaging of the head and neck was performed using the standard protocol during bolus administration of intravenous contrast. Multiplanar CT image reconstructions and MIPs were obtained to evaluate the vascular anatomy. Carotid stenosis measurements (when applicable) are obtained utilizing NASCET criteria, using the distal internal carotid diameter as the denominator. RADIATION DOSE REDUCTION: This exam was performed according to the departmental dose-optimization program which includes automated exposure control, adjustment of the mA and/or kV according to patient size and/or use of iterative reconstruction technique. CONTRAST:  75mL OMNIPAQUE IOHEXOL 350 MG/ML SOLN COMPARISON:  Same day brain MR FINDINGS: CT HEAD FINDINGS Brain: No hemorrhage. No hydrocephalus. No extra-axial fluid collection. No mass effect. No mass lesion. 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 Generalized volume loss without lobar predominance. Vascular: No hyperdense vessel or unexpected calcification. Skull: Normal. Negative for fracture or focal lesion. Sinuses/Orbits: No middle ear or mastoid effusion. Paranasal sinuses are clear. Bilateral lens replacement. Orbits are otherwise unremarkable. Other: None. Review of the MIP images confirms the above findings CTA NECK FINDINGS Aortic arch: Standard branching. Imaged portion shows no evidence of aneurysm or dissection. Mild-to-moderate narrowing in the right  subclavian artery Right carotid system: Moderate narrowing (approximately 50%) in the distal cervical ICA on the right (series 12, image 103). Left carotid system: No evidence of dissection, stenosis (50% or greater), or occlusion. Mild narrowing in the distal cervical ICA on the left. 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 Moderate to severe focal narrowing in the V1 segment of the left vertebral artery which arises from the aortic arch. Moderate narrowing in the V2 segment of the left vertebral artery. Skeleton: Negative. Other neck: Negative. Upper chest: Centrilobular and paraseptal emphysema. Small left pleural effusion. 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 Moderate stenosis in the cavernous segment of the left ICA (series 14, image 83). Moderate narrowing in the distal A2 segment of the right ACA (series 10, image 54). 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 No aneurysm. No vascular malformation. Venous sinuses: As permitted by contrast timing, patent. Anatomic variants: None Review of the MIP images confirms the  above findings IMPRESSION: 1. No hemorrhage or CT evidence of an acute cortical infarct. Infarcts seen on same day brain MRI are not visualized on this exam due to CT technique. 2. No emergent large vessel occlusion. 3. 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 There is sever stenosis in the V1 segment of the left vertebral artery. 4. Moderate narrowing (approximately 50%) in the distal cervical ICA on the right. 5. Moderate to severe narrowing in the cavernous segments of bilateral ICAs. Emphysema (ICD10-J43.9). Electronically Signed   By: Lorenza Cambridge M.D.   On: 04/25/2023 17:52   US THORACENTESIS ASP PLEURAL SPACE W/IMG GUIDE Result  Date: 04/25/2023 INDICATION: Patient is a 68 y/o male with history of CAD, HLD, HTN, MI, BPH and tobacco abuse. Patient presents for a diagnostic and therapeutic thoracentesis due to a right pleural effusion. EXAM: ULTRASOUND GUIDED RIGHT THORACENTESIS MEDICATIONS: 12mL of lidocaine 1% COMPLICATIONS: None immediate. PROCEDURE: An ultrasound guided thoracentesis was thoroughly discussed with the patient and questions answered. The benefits, risks, alternatives and complications were also discussed. The patient understands and wishes to proceed with the procedure. Written consent was obtained. Ultrasound was performed to localize and mark an adequate pocket of fluid in the right chest. The area was then prepped and draped in the normal sterile fashion. 1% Lidocaine was used for local anesthesia. Under ultrasound guidance a 6 Fr Safe-T-Centesis catheter was introduced. Thoracentesis was performed. The catheter was removed and a dressing applied. FINDINGS: A total of approximately 1.9L of clear, yellow fluid was removed. Samples were sent to the laboratory as requested by the clinical team. IMPRESSION: Successful ultrasound guided right thoracentesis yielding 1.9L of pleural fluid. Performed by Philipp Ovens PA-C Electronically Signed   By: Malachy Moan M.D.   On: 04/25/2023 16:30   DG Chest Port 1 View Result Date: 04/25/2023 CLINICAL DATA:  68 year old male status post right thoracentesis EXAM: PORTABLE CHEST 1 VIEW COMPARISON:  Prior chest x-ray 04/24/2023 FINDINGS: No evidence of pneumothorax following right-sided thoracentesis. Significant interval reduction right pleural effusion. Stable cardiomegaly and mild pulmonary edema. IMPRESSION: No evidence of pneumothorax or other complication following right-sided thoracentesis. Electronically Signed   By: Malachy Moan M.D.   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  Patient was brought in by a neighbor.  Patient states that he has been falling a lot.   Patient was found to have acute respiratory failure and a large pleural effusion with a mass underlying.  Patient had a Foley catheter placed.  Patient was started on IV heparin for mural thrombus seen on CT scan.  Echocardiogram pending.   1/20.  Patient states that he has been having trouble with his balance at home.  MRI of the brain showed 2 small acute infarcts in left [place] and slower absent flow in the right vertebral artery likely chronic.  Patient pulled out an IV.   Patient involuntarily committed.  Patient unable to give [place] a good reason on why he needs to leave today.  Needs further workup here in the hospital.   1.9 L drawn off from underneath the right lung by interventional radiology   1/21.  Cytology still pending.  EF less than 20%.  Patient on 4 L of oxygen this morning. 1/22: assumed care-- patient has settled  in the room. He appears pleasant and calm. Discussed and updated his medical issues. He is agreeing to stay and complete the work up. Discussed with him regarding discontinuing IVC. Son Orben Dexheimer was informed. Will discontinue IVC tomorrow morning. Patient is in agreement.   Assessment and Plan:   Acute stroke due to ischemia [facility]) --Neurology believes that this could be hypercoagulability versus small vessel etiology.  -- Currently on Eliquis.  As per neurology no need for aspirin.  -- LDL 84.  Start Lipitor. --Cotn PT/OT--rehab    Acute on chronic systolic CHF (congestive heart failure) (HCC) --EF less than 20% with right ventricular failure also.  Overall prognosis poor.   --Patient on Lasix 40 mg IV twice a day.  Patient started on Aldactone, Imdur,Cozaar also. --followed by [facility] HF team and [facility] cardiology --pt will need life vest prior to dc   Mass of right  lung Large Right pleural effusion --Thoracentesis on 1/20, With cytology since patient has a lung mass. --await cytology --consult pulmonary (dr Belia Heman) if needs bx  --1.9 L removed on 1/20 by interventional radiology with thoracentesis.   LV (left ventricular) mural thrombus Not commented on echocardiogram but CT scan.   --On Eliquis.   --CT scan could not rule out pulmonary embolism--now on rx dose of eliquis    AAA (abdominal aortic aneurysm) (HCC) --5.9 AAA seen on CT scan.  Not stable enough for any intervention currently.   Acute respiratory failure with hypoxia (HCC) --Patient with hypoxia in the emergency room of 88%.  Placed on 4 L.   Benign prostatic hyperplasia --Continue Flomax.     CKD stage 3a, GFR 45-59 ml/min (HCC) Today's creatinine 1.55  with a GFR of 48   Benign essential hypertension --Currently on Cozaar, Aldactone, imdur   Severe sepsis (HCC) --Ruled out   Tobacco use disorder --Nicotine patch.    TOC for discharge planning to rehab  Patient overall has a poor long-term prognosis.     Procedures: right-sided thoracentesis Family communication : spoke with son Chadwyck Schuchmann on the found Consults : cardiology, neurology CODE STATUS: full DVT Prophylaxis : eliquis Level of care: Med-Surg Status is: Inpatient Remains inpatient appropriate because: acute stroke, congestive heart failure, right lung mass worker pending    TOTAL TIME TAKING CARE OF THIS PATIENT: 45 minutes.  >50% time spent on counselling and coordination of care  Note: This dictation was prepared with Dragon dictation along with smaller phrase technology. Any transcriptional errors that result from this process are unintentional.  Enedina Finner M.D    [facility]   CC: Primary care physician; Patient, No Pcp Per

## 2023-04-27 NOTE — Progress Notes (Signed)
Advanced Heart Failure Rounding Note  Cardiologist: [facility]  Chief Complaint: Acute on chronic biventricular systolic CHF  Subjective:    2.2L UOP charted last 24 hrs with IV lasix. Multiple incontinent episodes documented by RN.  Sitter at bedside.  Dyspnea continues to improve. Ambulated with PT and OT yesterday afternoon.    Objective:   Weight Range: 64 kg Body mass index is 23.48 kg/m.   Vital Signs:   Temp:  [97.4 F (36.3 C)-98.8 F (37.1 C)] 98.8 F (37.1 C) (01/22 0347) Pulse Rate:  [72-84] 72 (01/22 0347) Resp:  [18-19] 18 (01/22 0347) BP: (114-137)/(64-76) 137/74 (01/22 0347) SpO2:  [95 %-98 %] 98 % (01/22 0347) Weight:  [64 kg] 64 kg (01/22 0347) Last BM Date : 04/25/23  Weight change: Filed Weights   04/24/23 1626 04/26/23 0500 04/27/23 0347  Weight: 71.2 kg 73 kg 64 kg    Intake/Output:   Intake/Output Summary (Last 24 hours) at 04/27/2023 0751 Last data filed at 04/27/2023 0537 Gross per 24 hour  Intake --  Output 2200 ml  Net -2200 ml      Physical Exam    General: Frail, thin, chronically ill appearing HEENT: Normal Neck: Supple. JVP elevated.  Cor: PMI nondisplaced. Regular rate & rhythm. No rubs, gallops or murmurs. Lungs: Diminished in bases Abdomen: Soft, nontender, nondistended. Extremities: No cyanosis, clubbing, rash, 1-2+ edema Neuro: Alert & orientedx3, cranial nerves grossly intact. moves all 4 extremities w/o difficulty. Affect pleasant   Telemetry   SR 60s  Labs    CBC Recent Labs    04/25/23 0055 04/26/23 0427  WBC 5.1 5.3  HGB 15.1 14.1  HCT 47.8 43.4  MCV 82.7 78.9*  PLT 88* 88*   Basic Metabolic Panel Recent Labs    16/10/96 1524 04/25/23 0055 04/26/23 0427  NA 136 136 136  K 4.1 4.1 3.5  CL 102 100 98  CO2 22 23 27   GLUCOSE 100* 96 76  BUN 32* 29* 29*  CREATININE 1.55* 1.47* 1.55*  CALCIUM 9.2 8.5* 8.6*  MG 2.4  --   --    Liver Function Tests Recent Labs    04/24/23 1524  04/25/23 0055  AST 29 26  ALT 22 22  ALKPHOS 95 87  BILITOT 3.5* 2.5*  PROT 7.1 6.0*  ALBUMIN 2.9* 2.6*   No results for input(s): "LIPASE", "AMYLASE" in the last 72 hours. Cardiac Enzymes No results for input(s): "CKTOTAL", "CKMB", "CKMBINDEX", "TROPONINI" in the last 72 hours.  BNP: BNP (last 3 results) Recent Labs    04/24/23 1524  BNP >4,500.0*    ProBNP (last 3 results) No results for input(s): "PROBNP" in the last 8760 hours.   D-Dimer No results for input(s): "DDIMER" in the last 72 hours. Hemoglobin A1C Recent Labs    04/25/23 0500  HGBA1C 6.7*   Fasting Lipid Panel Recent Labs    04/25/23 0500  CHOL 127  HDL 27*  LDLCALC 84  TRIG 82  CHOLHDL 4.7   Thyroid Function Tests Recent Labs    04/24/23 1651  TSH 3.359    Other results:   Imaging    No results found.   Medications:     Scheduled Medications:  apixaban  10 mg Oral BID   Followed by   Melene Muller ON 05/02/2023] apixaban  5 mg Oral BID   atorvastatin  80 mg Oral QPM   azithromycin  250 mg Oral Daily   feeding supplement  237 mL Oral BID BM  furosemide  40 mg Intravenous BID   nicotine  21 mg Transdermal Daily   sacubitril-valsartan  1 tablet Oral BID   sodium chloride flush  3 mL Intravenous Q12H   spironolactone  25 mg Oral Daily   tamsulosin  0.4 mg Oral Daily    Infusions:  cefTRIAXone (ROCEPHIN)  IV 2 g (04/26/23 2123)    PRN Medications: acetaminophen **OR** acetaminophen, morphine injection    Patient Profile   68 y.o. male with history of CAD s/p anterior MI and stent to LAD in 2006, longstanding tobacco use. Admitted with acute respiratory failure 2/2 large R pleural effusion w/ ? RLL lung mass and acute systolic CHF.    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 CTO of RCA with collaterals. -EF 45% in 2013 with WMA in LAD territory -Echo this admit with LVEF < 20%, RV severely  reduced, thick RV and LV, RVSP 50 mmHg, moderate MR, moderate TR.  -Echo reviewed by Dr. Elwyn Lade. Cardiomyopathy appears primarily nonischemic. Significant LVH raising suspicion for cardiac amyloidosis vs hypertensive cardiomyopathy -Not currently candidate for cardiac cath with LV thrombus and CVA -Awaiting cMRI and amyloid labs. If workup highly suspicious for cardiac amyloid, will need to consider benefit of treating pending workup for lung mass -Needs further diuresis. Continue diuresis with IV lasix 40 BID. May be able to transition to po Torsemide tomorrow. Supp K and mag. -Losartan increased to 50 mg daily. Sherryll Burger would be very expensive with his insurance. -Continue spiro 25 mg daily -Hesitant to use SGLT2i with hygiene and history of urinary retention   2. CAD -Hx anterior MI in 2006 s/p PCI to LAD -He's had known CTO to RCA with collaterals. This was unchanged on last cath in 2017 -Not currently on antiplatelet d/t need for anticoagulation. Continue 80 mg atorvastatin daily.   3. LV apical thrombus -Noted on CT -Continue Eliquis 5 mg BID   4. RLL lung mass Large R pleural effusion -RLL mass measuring 3.6 cm -S/p thoracentesis which yielded 1.9L fluid -Final cytology pending. Possible bronch/EBUS per Pulmonology   5. CVA -MRI brain 2 punctate infarcts left putamen -Neurology following -? Cardioembolic in setting of LV thrombus   6. AAA -5.9 cm AAA incidentally noted on imaging -Not stable enough for an intervention   7. CKD IIIa -Scr 1.44 -Follow with diuresis   8. Tobacco use -Has smoked for many years -Encouraged cessation  Length of Stay: 3  Iverna Hammac N, PA-C  04/27/2023, 7:51 AM  Advanced Heart Failure Team Pager [phone] (M-F; 7a - 5p)  Please contact [facility] for night-coverage after hours (5p -7a ) and weekends on [email or web address]

## 2023-04-27 NOTE — Care Management Important Message (Signed)
Important Message  Patient Details  Name: Dennis Church MRN: [id number] Date of Birth: [birth year]   Important Message Given:  Yes - Medicare IM     Cristela Blue, CMA 04/27/2023, 10:53 AM

## 2023-04-28 DIAGNOSIS — I639 Cerebral infarction, unspecified: Secondary | ICD-10-CM | POA: Diagnosis not present

## 2023-04-28 DIAGNOSIS — I5082 Biventricular heart failure: Secondary | ICD-10-CM | POA: Diagnosis not present

## 2023-04-28 LAB — COMP PANEL: LEUKEMIA/LYMPHOMA

## 2023-04-28 LAB — BASIC METABOLIC PANEL
Anion gap: 10 (ref 5–15)
BUN: 27 mg/dL — ABNORMAL HIGH (ref 8–23)
CO2: 28 mmol/L (ref 22–32)
Calcium: 8.3 mg/dL — ABNORMAL LOW (ref 8.9–10.3)
Chloride: 91 mmol/L — ABNORMAL LOW (ref 98–111)
Creatinine, Ser: 1.33 mg/dL — ABNORMAL HIGH (ref 0.61–1.24)
GFR, Estimated: 58 mL/min — ABNORMAL LOW (ref >60)
Glucose, Bld: 91 mg/dL (ref 70–99)
Potassium: 4 mmol/L (ref 3.5–5.1)
Sodium: 129 mmol/L — ABNORMAL LOW (ref 135–145)

## 2023-04-28 LAB — CBC
HCT: 44.3 % (ref 39.0–52.0)
Hemoglobin: 14.6 g/dL (ref 13.0–17.0)
MCH: 25.3 pg — ABNORMAL LOW (ref 26.0–34.0)
MCHC: 33 g/dL (ref 30.0–36.0)
MCV: 76.8 fL — ABNORMAL LOW (ref 80.0–100.0)
Platelets: 99 10*3/uL — ABNORMAL LOW (ref 150–400)
RBC: 5.77 MIL/uL (ref 4.22–5.81)
RDW: 18.6 % — ABNORMAL HIGH (ref 11.5–15.5)
WBC: 5.3 10*3/uL (ref 4.0–10.5)
nRBC: 0 % (ref 0.0–0.2)

## 2023-04-28 LAB — MAGNESIUM: Magnesium: 2.3 mg/dL (ref 1.7–2.4)

## 2023-04-28 MED ORDER — METOPROLOL SUCCINATE ER 25 MG PO TB24
25.0000 mg | ORAL_TABLET | Freq: Every day | ORAL | Status: DC
  Administered 2023-04-28 – 2023-04-30 (×2): 25 mg via ORAL
  Filled 2023-04-28 (×3): qty 1

## 2023-04-28 MED ORDER — TORSEMIDE 20 MG PO TABS
20.0000 mg | ORAL_TABLET | Freq: Every day | ORAL | Status: DC
  Administered 2023-04-28 – 2023-04-30 (×3): 20 mg via ORAL
  Filled 2023-04-28 (×3): qty 1

## 2023-04-28 MED ORDER — QUETIAPINE FUMARATE 25 MG PO TABS: 25.0000 mg | ORAL_TABLET | Freq: Every evening | ORAL | Status: DC | PRN

## 2023-04-28 MED ORDER — LORAZEPAM 2 MG/ML IJ SOLN
2.0000 mg | Freq: Once | INTRAMUSCULAR | Status: AC
  Administered 2023-04-28: 2 mg via INTRAVENOUS
  Filled 2023-04-28: qty 1

## 2023-04-28 NOTE — Progress Notes (Signed)
   04/28/23 1601  What Happened  Was fall witnessed? Yes  Who witnessed fall? Inetta Fermo RN, Morton Stall NP with Palliative, Daria Pastures RN, and Kathie Dike OT  Patients activity before fall ambulating-unassisted (staff attempting to redirect patient back to his room, states he is looking for his guns)  Point of contact buttocks  Was patient injured? No  Provider Notification  Provider Name/Title Dr Enedina Finner  Date Provider Notified 04/28/23  Time Provider Notified 1555  Method of Notification Call (secure chat with immediate response)  Notification Reason Fall  Provider response See new orders (sitter and PRN medication for agitation)  Date of Provider Response 04/28/23  Time of Provider Response 1556  Follow Up  Family notified Yes - comment (son Herbie Fansler notified by Alvino Chapel RN Charge)  Time family notified 1640  Adult Fall Risk Assessment  Risk Factor Category (scoring not indicated) Fall has occurred during this admission (document High fall risk)  Patient Fall Risk Level High fall risk  Adult Fall Risk Interventions  Required Bundle Interventions *See Row Information* High fall risk - low, moderate, and high requirements implemented  Additional Interventions PT/OT need assessed if change in mobility from baseline;Reorient/diversional activities with confused patients;Use of appropriate toileting equipment (bedpan, BSC, etc.) (pt to be moved to a room closer to nurse station when room is available)  Screening for Fall Injury Risk (To be completed on HIGH fall risk patients) - Assessing Need for Floor Mats  Risk For Fall Injury- Criteria for Floor Mats None identified - No additional interventions needed  Vitals  Temp 98.8 F (37.1 C)  Temp Source Oral  BP 133/70  MAP (mmHg) 88  BP Location Left Arm  BP Method Automatic  Patient Position (if appropriate) Lying  Pulse Rate 93  Pulse Rate Source Monitor  Resp 19  Oxygen Therapy  SpO2 100 %  O2 Device Nasal Cannula

## 2023-04-28 NOTE — BH Assessment (Signed)
[facility]  PAPERS  RESCINDED  PER  DR  Enedina Finner  MD  INFORMED  Dinah Beers  RN

## 2023-04-28 NOTE — Plan of Care (Signed)
  Problem: Education: Goal: Knowledge of disease or condition will improve 04/28/2023 1852 by Harrietta Guardian, RN Outcome: Progressing 04/28/2023 1852 by Harrietta Guardian, RN Outcome: Progressing Goal: Knowledge of secondary prevention will improve (MUST DOCUMENT ALL) 04/28/2023 1852 by Harrietta Guardian, RN Outcome: Progressing 04/28/2023 1852 by Harrietta Guardian, RN Outcome: Progressing Goal: Knowledge of patient specific risk factors will improve (DELETE if not current risk factor) 04/28/2023 1852 by Harrietta Guardian, RN Outcome: Progressing 04/28/2023 1852 by Harrietta Guardian, RN Outcome: Progressing   Problem: Ischemic Stroke/TIA Tissue Perfusion: Goal: Complications of ischemic stroke/TIA will be minimized 04/28/2023 1852 by Harrietta Guardian, RN Outcome: Progressing 04/28/2023 1852 by Harrietta Guardian, RN Outcome: Progressing   Problem: Coping: Goal: Will verbalize positive feelings about self 04/28/2023 1852 by Harrietta Guardian, RN Outcome: Progressing 04/28/2023 1852 by Harrietta Guardian, RN Outcome: Progressing Goal: Will identify appropriate support needs Outcome: Progressing   Problem: Health Behavior/Discharge Planning: Goal: Ability to manage health-related needs will improve Outcome: Progressing Goal: Goals will be collaboratively established with patient/family Outcome: Progressing   Problem: Self-Care: Goal: Ability to participate in self-care as condition permits will improve Outcome: Progressing Goal: Verbalization of feelings and concerns over difficulty with self-care will improve Outcome: Progressing Goal: Ability to communicate needs accurately will improve Outcome: Progressing   Problem: Nutrition: Goal: Risk of aspiration will decrease Outcome: Progressing Goal: Dietary intake will improve Outcome: Progressing   Problem: Education: Goal: Knowledge of General Education information will improve Description: Including pain rating scale, medication(s)/side  effects and non-pharmacologic comfort measures Outcome: Progressing   Problem: Health Behavior/Discharge Planning: Goal: Ability to manage health-related needs will improve Outcome: Progressing   Problem: Clinical Measurements: Goal: Ability to maintain clinical measurements within normal limits will improve Outcome: Progressing Goal: Will remain free from infection Outcome: Progressing Goal: Diagnostic test results will improve Outcome: Progressing Goal: Respiratory complications will improve Outcome: Progressing Goal: Cardiovascular complication will be avoided Outcome: Progressing   Problem: Activity: Goal: Risk for activity intolerance will decrease Outcome: Progressing   Problem: Nutrition: Goal: Adequate nutrition will be maintained Outcome: Progressing   Problem: Coping: Goal: Level of anxiety will decrease Outcome: Progressing   Problem: Elimination: Goal: Will not experience complications related to bowel motility Outcome: Progressing Goal: Will not experience complications related to urinary retention Outcome: Progressing   Problem: Pain Managment: Goal: General experience of comfort will improve and/or be controlled Outcome: Progressing   Problem: Safety: Goal: Ability to remain free from injury will improve Outcome: Progressing   Problem: Skin Integrity: Goal: Risk for impaired skin integrity will decrease Outcome: Progressing

## 2023-04-28 NOTE — Progress Notes (Signed)
[facility]  - [place] at [facility]   PATIENT NAME: Dennis Church    MR#:  [id number]  DATE OF BIRTH:  13-Mar-1956  SUBJECTIVE:  patient sitting out in the chair. His. His of confusion. Answers most of the questions appropriately. Things he is going home soon. No family at bedside. Spoke with patient's son Dennis Church.  VITALS:  Blood pressure 138/80, pulse 88, temperature 97.7 F (36.5 C), resp. rate 16, height 5\' 5"  (1.651 m), weight 64 kg, SpO2 95%.  PHYSICAL EXAMINATION:   GENERAL:  68 y.o.-year-old patient with no acute distress. Chronically ill thin malnourished LUNGS: distant breath sounds bilaterally, no wheezing CARDIOVASCULAR: S1, S2 normal. No murmur   ABDOMEN: Soft, nontender, nondistended.  EXTREMITIES: No  edema b/l.    NEUROLOGIC: nonfocal  patient is alert and awake  LABORATORY PANEL:  CBC Recent Labs  Lab 04/28/23 0424  WBC 5.3  HGB 14.6  HCT 44.3  PLT 99*    Chemistries  Recent Labs  Lab 04/25/23 0055 04/26/23 0427 04/28/23 0424  NA 136   < > 129*  K 4.1   < > 4.0  CL 100   < > 91*  CO2 23   < > 28  GLUCOSE 96   < > 91  BUN 29*   < > 27*  CREATININE 1.47*   < > 1.33*  CALCIUM 8.5*   < > 8.3*  MG  --    < > 2.3  AST 26  --   --   ALT 22  --   --   ALKPHOS 87  --   --   BILITOT 2.5*  --   --    < > = values in this interval not displayed.   Cardiac Enzymes No results for input(s): "TROPONINI" in the last 168 hours. RADIOLOGY:  MR CARDIAC MORPHOLOGY W WO CONTRAST Result Date: 04/27/2023 CLINICAL DATA:  Cardiomyopathy, LV thrombus EXAM: CARDIAC MRI TECHNIQUE: The patient was scanned on a 1.5 Tesla Siemens magnet. A dedicated cardiac coil was used. Functional imaging was done using Fiesta sequences. 2,3, and 4 chamber views were done to assess for RWMA's. Modified Simpson's rule using a short axis stack was used to calculate an ejection fraction on a dedicated work [employer], [employer]. The patient received 10 cc of  Gadavist. After 10 minutes inversion recovery sequences were used to assess for infiltration and scar tissue. Velocity flow mapping performed in the ascending aorta and main pulmonary artery. CONTRAST:  10 cc  of Gadavist FINDINGS: 1. Normal left ventricular size, mild LV thickness and systolic function (LVEF =23%). There is global hypokinesis. There is near transmural (75%) subendocardial late gadolinium enhancement in the left ventricular myocardial basal-mid inferior wall. There is subendocardial LGE (50%) in the mid-apical LV segments involving the anterior, anteroseptal and apical walls. Total LV LGE scar 34g, approximately 20% of total LV myocardial mass. LVEDV: 194 ml LVESV: 150 ml SV: 44 ml CO: 3.2 L/min Myocardial mass: 167 g 2. Normal right ventricular size and thickness. Mildly reduced LV systolic function (RVEF =37%). There are no regional wall motion abnormalities. 3.  Mildly dilated left and right atrium. 4. Normal size of the aortic root, ascending aorta and pulmonary artery. 5.  Mild mitral and tricuspid regurgitation. 6.  Normal pericardium.  No pericardial effusion. IMPRESSION: 1.  Severely reduced LV systolic function.  LVEF 23%. 2. Near transmural (75%) subendocardial LGE in the LV basal-mid inferior wall (non viable). 3. Subendocardial LGE (50%)  in the mid-apical LV segments involving the anterior, anteroseptal and apical walls (appear viable). 4. Total LV LGE/scar is 34g, approximately 20% of total LV myocardial mass. 5.  Mildly reduced RV function. 6. 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 Electronically Signed   By: Debbe Odea M.D.   On: 04/27/2023 13:40   MR CARDIAC VELOCITY FLOW MAP Result Date: 04/27/2023 CLINICAL DATA:  Cardiomyopathy, LV thrombus EXAM: CARDIAC MRI TECHNIQUE: The patient was scanned on a 1.5 Tesla Siemens magnet. A dedicated cardiac coil was used. Functional imaging was done using Fiesta  sequences. 2,3, and 4 chamber views were done to assess for RWMA's. Modified Simpson's rule using a short axis stack was used to calculate an ejection fraction on a dedicated work [employer] officer, [employer]. The patient received 10 cc of Gadavist. After 10 minutes inversion recovery sequences were used to assess for infiltration and scar tissue. Velocity flow mapping performed in the ascending aorta and main pulmonary artery. CONTRAST:  10 cc  of Gadavist FINDINGS: 1. Normal left ventricular size, mild LV thickness and systolic function (LVEF =23%). There is global hypokinesis. There is near transmural (75%) subendocardial late gadolinium enhancement in the left ventricular myocardial basal-mid inferior wall. There is subendocardial LGE (50%) in the mid-apical LV segments involving the anterior, anteroseptal and apical walls. Total LV LGE scar 34g, approximately 20% of total LV myocardial mass. LVEDV: 194 ml LVESV: 150 ml SV: 44 ml CO: 3.2 L/min Myocardial mass: 167 g 2. Normal right ventricular size and thickness. Mildly reduced LV systolic function (RVEF =37%). There are no regional wall motion abnormalities. 3.  Mildly dilated left and right atrium. 4. Normal size of the aortic root, ascending aorta and pulmonary artery. 5.  Mild mitral and tricuspid regurgitation. 6.  Normal pericardium.  No pericardial effusion. IMPRESSION: 1.  Severely reduced LV systolic function.  LVEF 23%. 2. Near transmural (75%) subendocardial LGE in the LV basal-mid inferior wall (non viable). 3. Subendocardial LGE (50%) in the mid-apical LV segments involving the anterior, anteroseptal and apical walls (appear viable). 4. Total LV LGE/scar is 34g, approximately 20% of total LV myocardial mass. 5.  Mildly reduced RV function. 6. 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 Electronically Signed   By: Debbe Odea M.D.   On: 04/27/2023 13:40   MR CARDIAC VELOCITY FLOW  MAP Result Date: 04/27/2023 CLINICAL DATA:  Cardiomyopathy, LV thrombus EXAM: CARDIAC MRI TECHNIQUE: The patient was scanned on a 1.5 Tesla Siemens magnet. A dedicated cardiac coil was used. Functional imaging was done using Fiesta sequences. 2,3, and 4 chamber views were done to assess for RWMA's. Modified Simpson's rule using a short axis stack was used to calculate an ejection fraction on a dedicated work [employer] officer, [employer]. The patient received 10 cc of Gadavist. After 10 minutes inversion recovery sequences were used to assess for infiltration and scar tissue. Velocity flow mapping performed in the ascending aorta and main pulmonary artery. CONTRAST:  10 cc  of Gadavist FINDINGS: 1. Normal left ventricular size, mild LV thickness and systolic function (LVEF =23%). There is global hypokinesis. There is near transmural (75%) subendocardial late gadolinium enhancement in the left ventricular myocardial basal-mid inferior wall. There is subendocardial LGE (50%) in the mid-apical LV segments involving the anterior, anteroseptal and apical walls. Total LV LGE scar 34g, approximately 20% of total LV myocardial mass. LVEDV: 194 ml LVESV: 150 ml SV: 44 ml  CO: 3.2 L/min Myocardial mass: 167 g 2. Normal right ventricular size and thickness. Mildly reduced LV systolic function (RVEF =37%). There are no regional wall motion abnormalities. 3.  Mildly dilated left and right atrium. 4. Normal size of the aortic root, ascending aorta and pulmonary artery. 5.  Mild mitral and tricuspid regurgitation. 6.  Normal pericardium.  No pericardial effusion. IMPRESSION: 1.  Severely reduced LV systolic function.  LVEF 23%. 2. Near transmural (75%) subendocardial LGE in the LV basal-mid inferior wall (non viable). 3. Subendocardial LGE (50%) in the mid-apical LV segments involving the anterior, anteroseptal and apical walls (appear viable). 4. Total LV LGE/scar is 34g, approximately 20% of total LV myocardial mass. 5.  Mildly  reduced RV function. 6. 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 Electronically Signed   By: Debbe Odea M.D.   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  Patient was brought in by a neighbor.  Patient states that he has been falling a lot.   Patient was found to have acute respiratory failure and a large pleural effusion with a mass underlying.  Patient had a Foley catheter placed.  Patient was started on IV heparin for mural thrombus seen on CT scan.  Echocardiogram pending.   1/20.  Patient states that he has been having trouble with his balance at home.  MRI of the brain showed 2 small acute infarcts in left [place] and slower absent flow in the right vertebral artery likely chronic.  Patient pulled out an IV.   Patient involuntarily committed.  Patient unable to give [place] a good reason on why he needs to leave today.  Needs further workup here in the hospital.   1.9 L drawn off from underneath the right lung by interventional radiology   1/21.  Cytology still pending.  EF less than 20%.  Patient on 4 L of oxygen this morning. 1/22: assumed care-- patient has settled in the room. He appears pleasant and calm. Discussed and updated his medical issues. He is agreeing to stay and complete the work up. Discussed with him regarding discontinuing IVC. Son Dennis Church was informed. Will discontinue IVC tomorrow morning. Patient is in agreement.   Assessment and Plan:   Acute stroke due to ischemia [facility]) --Neurology believes that this could be hypercoagulability versus small vessel etiology.  -- Currently on Eliquis.  As per neurology no need for aspirin.  -- LDL 84.  Start Lipitor. --Cotn PT/OT--rehab    Acute on chronic systolic CHF  (congestive heart failure) (HCC) --EF less than 20% with right ventricular failure also.  Overall prognosis poor.   --Patient on Lasix 40 mg IV twice a day.  Patient started on Aldactone, Imdur,Cozaar also. --followed by [facility] HF team and [facility] cardiology --pt will need life vest prior to dc  -- patient now on PO torsemide  Mass of right lung Large Right pleural effusion --Thoracentesis on 1/20, With cytology since patient has a lung mass. --1.9 L removed on 1/20 by interventional radiology with thoracentesis. -- Pleural fluid cytology negative for malignancy -- case discussed with [name] pulmonary. Patient currently is not a candidate for bronchoscopy/EBUS given other medical comorbidities. -- Oncology consultation with [name]. She will set up patient as outpatient for full work up once patient is  done with rehab.   LV (left ventricular) mural thrombus Not commented on echocardiogram but CT scan.   --On Eliquis.   --CT scan could not rule out pulmonary embolism--now on rx dose of eliquis    AAA (abdominal aortic aneurysm) (HCC) --5.9 AAA seen on CT scan.  Not stable enough for any intervention currently.   Acute respiratory failure with hypoxia (HCC) --Patient with hypoxia in the emergency room of 88%.  Placed on 4 L.   Benign prostatic hyperplasia --Continue Flomax.     CKD stage 3a, GFR 45-59 ml/min (HCC) Today's creatinine 1.55  with a GFR of 48   Benign essential hypertension --Currently on Cozaar, Aldactone, imdur   Severe sepsis (HCC) --Ruled out   Tobacco use disorder --Nicotine patch.    TOC for discharge planning to rehab  Patient overall has a poor long-term prognosis. Palliative care consultation    Procedures: right-sided thoracentesis Family communication : spoke with son Tieler Markham on the phone Consults : cardiology, neurology CODE STATUS: full DVT Prophylaxis : eliquis Level of care: Med-Surg Status is: Inpatient Remains inpatient appropriate  because: acute stroke, congestive heart failure, right lung mass worker pending    TOTAL TIME TAKING CARE OF THIS PATIENT: 35 minutes.  >50% time spent on counselling and coordination of care  Note: This dictation was prepared with Dragon dictation along with smaller phrase technology. Any transcriptional errors that result from this process are unintentional.  Enedina Finner M.D    [facility]   CC: Primary care physician; Patient, No Pcp Per

## 2023-04-28 NOTE — Progress Notes (Signed)
Occupational Therapy Treatment Patient Details Name: Dennis Church MRN: [id number] DOB: 03-03-1956 Today's Date: 04/28/2023   History of present illness 68 y/o male w/ history of CIDP, generalized weakness, BPH, hypertension, tobacco abuse, hyperlipidemia, MI,  presenting to the emergency department w/ c/o balance, falls, losing weight, no energy, has undergone a thoracentesis, workup found  LV apical thrombus on CTA chest, MRI positive for acute infarcts.   OT comments  Dennis Church was seen for OT treatment on this date. Pt found standing in hallway with RN, RN reporting fall in hallway. Gait belt placed on pt and assisted back to room with MIN A + HHA; multiple minor LOBs noted. Attempted hand wahsing in standing with pt perseverative on locating paper towels under container rather than paper towel dispenser. Redirected towards bed - required SUPERVISION sit>sup. Left with NT in room. Cognition limiting progress and pt remains high fall risk, rec gait belt all OOB tasks, will continue to follow POC. Discharge recommendation remains appropriate.       If plan is discharge home, recommend the following:  A lot of help with bathing/dressing/bathroom;A little help with walking and/or transfers   Equipment Recommendations  BSC/3in1    Recommendations for Other Services      Precautions / Restrictions Precautions Precautions: Fall Restrictions Weight Bearing Restrictions Per Provider Order: No       Mobility Bed Mobility Overal bed mobility: Needs Assistance Bed Mobility: Sit to Supine       Sit to supine: Supervision        Transfers Overall transfer level: Needs assistance Equipment used: 1 person hand held assist Transfers: Sit to/from Stand Sit to Stand: Min assist                 Balance Overall balance assessment: Needs assistance Sitting-balance support: Feet supported Sitting balance-Leahy Scale: Fair     Standing balance support: Single extremity  supported Standing balance-Leahy Scale: Poor                             ADL either performed or assessed with clinical judgement   ADL Overall ADL's : Needs assistance/impaired                                       General ADL Comments: MIN A + HHA for ADL t/f ~50 ft. Attempted hand wahsing in standing with pt perseverative on attempting to locate paper towels under container rather than paper towel dispenser.      Cognition Arousal: Alert Behavior During Therapy: Impulsive Overall Cognitive Status: No family/caregiver present to determine baseline cognitive functioning                                 General Comments: cues to redirect                   Pertinent Vitals/ Pain       Pain Assessment Pain Assessment: No/denies pain   Frequency  Min 1X/week        Progress Toward Goals  OT Goals(current goals can now be found in the care plan section)  Progress towards OT goals: Progressing toward goals  Acute Rehab OT Goals OT Goal Formulation: With patient Time For Goal Achievement: 05/10/23 Potential to Achieve Goals: Good ADL Goals Pt  Will Perform Grooming: Independently;standing Pt Will Perform Lower Body Dressing: Independently;sit to/from stand Pt Will Transfer to Toilet: Independently;ambulating;regular height toilet  Plan      Co-evaluation                 AM-PAC OT "6 Clicks" Daily Activity     Outcome Measure   Help from another person eating meals?: None Help from another person taking care of personal grooming?: A Little Help from another person toileting, which includes using toliet, bedpan, or urinal?: A Little Help from another person bathing (including washing, rinsing, drying)?: A Little Help from another person to put on and taking off regular upper body clothing?: A Little Help from another person to put on and taking off regular lower body clothing?: A Little 6 Click Score: 19     End of Session Equipment Utilized During Treatment: Gait belt  OT Visit Diagnosis: Other abnormalities of gait and mobility (R26.89);Muscle weakness (generalized) (M62.81)   Activity Tolerance Patient tolerated treatment well   Patient Left in bed;with call bell/phone within reach;with nursing/sitter in room   Nurse Communication Mobility status        Time: 1308-6578 OT Time Calculation (min): 10 min  Charges: OT General Charges $OT Visit: 1 Visit OT Treatments $Self Care/Home Management : 8-22 mins  Kathie Dike, M.S. OTR/L  04/28/23, 4:05 PM  ascom [phone]

## 2023-04-28 NOTE — Progress Notes (Signed)
Physical Therapy Treatment Patient Details Name: Dennis Church MRN: [id number] DOB: Jun 07, 1955 Today's Date: 04/28/2023   History of Present Illness 68 y/o male w/ history of CIDP, generalized weakness, BPH, hypertension, tobacco abuse, hyperlipidemia, MI,  presenting to the emergency department w/ c/o balance, falls, losing weight, no energy, has undergone a thoracentesis, workup found  LV apical thrombus on CTA chest, MRI positive for acute infarcts.    PT Comments  Pt was laying in recliner upon arrival. He is agreeable to session and pleasant throughout however lacks insight of current situation. Pt was on rm air throughout session with sao2 > 92%. He was able to stand and ambulate but remains extremely high fall risk. Pt presents with severe balance deficits. Several occasions of LOB with therapist intervention to prevent falling. Pt is far from his baseline. Discussed DC recommendation for rehab and pt is agreeable. Noted palliative consult ordered. Acute PT will continue to follow per current POC.    If plan is discharge home, recommend the following: A little help with walking and/or transfers;A little help with bathing/dressing/bathroom;Assistance with cooking/housework;Assist for transportation;Help with stairs or ramp for entrance;Direct supervision/assist for medications management     Equipment Recommendations  Other (comment) (defer to next level of care)       Precautions / Restrictions Precautions Precautions: Fall Restrictions Weight Bearing Restrictions Per Provider Order: No     Mobility  Bed Mobility  General bed mobility comments: In recliner pre/post session. (pt did not have chair alarm prior to PT session or after)    Transfers Overall transfer level: Needs assistance Equipment used: None, 1 person hand held assist Transfers: Sit to/from Stand Sit to Stand: Contact guard assist  General transfer comment: CGA for safety. pt is able to stand but vcs for improved  technique and safety.    Ambulation/Gait Ambulation/Gait assistance: Contact guard assist, Min assist Gait Distance (Feet): 400 Feet Assistive device: None, 1 person hand held assist Gait Pattern/deviations: Narrow base of support, Scissoring, Staggering left, Staggering right, Drifts right/left, Step-through pattern Gait velocity: decrease  General Gait Details: pt was able to ambulate > 400 ft however need alot of intervention at times to prevent falling. encouraged pt to use RW at all times when not with PT/ working on balance. Pt tolerate activity well but remains limited.    Balance Overall balance assessment: Needs assistance Sitting-balance support: Feet supported Sitting balance-Leahy Scale: Fair     Standing balance support: Bilateral upper extremity supported Standing balance-Leahy Scale: Poor Standing balance comment: pt is at extremely high fall risk       Cognition Arousal: Alert Behavior During Therapy: WFL for tasks assessed/performed Overall Cognitive Status: No family/caregiver present to determine baseline cognitive functioning    General Comments: pt oriented to self and location, noted poor insight           General Comments General comments (skin integrity, edema, etc.): pt was on rm air throughout session with sao2 > 92%      Pertinent Vitals/Pain Pain Assessment Pain Assessment: No/denies pain     PT Goals (current goals can now be found in the care plan section) Acute Rehab PT Goals Patient Stated Goal: get better so I can go home Progress towards PT goals: Progressing toward goals    Frequency    Min 1X/week       Co-evaluation     PT goals addressed during session: Mobility/safety with mobility;Balance;Strengthening/ROM        AM-PAC PT "6 Clicks" Mobility  Outcome Measure  Help needed turning from your back to your side while in a flat bed without using bedrails?: None Help needed moving from lying on your back to sitting  on the side of a flat bed without using bedrails?: A Little Help needed moving to and from a bed to a chair (including a wheelchair)?: A Little Help needed standing up from a chair using your arms (e.g., wheelchair or bedside chair)?: A Little Help needed to walk in hospital room?: A Little Help needed climbing 3-5 steps with a railing? : A Little 6 Click Score: 19    End of Session   Activity Tolerance: Patient tolerated treatment well Patient left: in chair;Other (comment) Nurse Communication: Mobility status PT Visit Diagnosis: Other abnormalities of gait and mobility (R26.89);Difficulty in walking, not elsewhere classified (R26.2);Muscle weakness (generalized) (M62.81)     Time: 7829-5621 PT Time Calculation (min) (ACUTE ONLY): 25 min  Charges:    $Gait Training: 8-22 mins $Neuromuscular Re-education: 8-22 mins PT General Charges $$ ACUTE PT VISIT: 1 Visit                    Jetta Lout PTA 04/28/23, 12:17 PM

## 2023-04-28 NOTE — Progress Notes (Signed)
Advanced Heart Failure Rounding Note  Cardiologist: [facility]  Chief Complaint: Acute on chronic biventricular systolic CHF  Subjective:    No complaints. Denies dyspnea, orthopnea and PND.   Objective:   Weight Range: 64 kg Body mass index is 23.48 kg/m.   Vital Signs:   Temp:  [97.6 F (36.4 C)-98.4 F (36.9 C)] 97.9 F (36.6 C) (01/23 0057) Pulse Rate:  [76-88] 88 (01/23 0057) Resp:  [14-18] 16 (01/23 0057) BP: (103-133)/(54-80) 113/58 (01/23 0057) SpO2:  [95 %-98 %] 95 % (01/23 0057) Last BM Date : 04/25/23  Weight change: Filed Weights   04/24/23 1626 04/26/23 0500 04/27/23 0347  Weight: 71.2 kg 73 kg 64 kg    Intake/Output:   Intake/Output Summary (Last 24 hours) at 04/28/2023 0742 Last data filed at 04/28/2023 0634 Gross per 24 hour  Intake 535.46 ml  Output 3925 ml  Net -3389.54 ml      Physical Exam    General:  Frail. Chronically ill appearing HEENT: normal Neck: supple. no JVD.  Cor: PMI nondisplaced. Regular rate & rhythm. No rubs, gallops or murmurs. Lungs: no respiratory distress Abdomen: soft, nontender, nondistended.  Extremities: no cyanosis, clubbing, rash, edema Neuro: Affect pleasant  Telemetry   SR 60s  Labs    CBC Recent Labs    04/27/23 0924 04/28/23 0424  WBC 5.2 5.3  HGB 14.6 14.6  HCT 45.4 44.3  MCV 79.1* 76.8*  PLT 90* 99*   Basic Metabolic Panel Recent Labs    09/81/19 0924 04/28/23 0424  NA 132* 129*  K 3.3* 4.0  CL 94* 91*  CO2 29 28  GLUCOSE 82 91  BUN 28* 27*  CREATININE 1.44* 1.33*  CALCIUM 8.4* 8.3*  MG 1.9 2.3   Liver Function Tests No results for input(s): "AST", "ALT", "ALKPHOS", "BILITOT", "PROT", "ALBUMIN" in the last 72 hours.  No results for input(s): "LIPASE", "AMYLASE" in the last 72 hours. Cardiac Enzymes No results for input(s): "CKTOTAL", "CKMB", "CKMBINDEX", "TROPONINI" in the last 72 hours.  BNP: BNP (last 3 results) Recent Labs    04/24/23 1524  BNP >4,500.0*     ProBNP (last 3 results) No results for input(s): "PROBNP" in the last 8760 hours.   D-Dimer No results for input(s): "DDIMER" in the last 72 hours. Hemoglobin A1C No results for input(s): "HGBA1C" in the last 72 hours.  Fasting Lipid Panel No results for input(s): "CHOL", "HDL", "LDLCALC", "TRIG", "CHOLHDL", "LDLDIRECT" in the last 72 hours.  Thyroid Function Tests No results for input(s): "TSH", "T4TOTAL", "T3FREE", "THYROIDAB" in the last 72 hours.  Invalid input(s): "FREET3"   Other results:   Imaging    MR CARDIAC MORPHOLOGY W WO CONTRAST Result Date: 04/27/2023 CLINICAL DATA:  Cardiomyopathy, LV thrombus EXAM: CARDIAC MRI TECHNIQUE: The patient was scanned on a 1.5 Tesla Siemens magnet. A dedicated cardiac coil was used. Functional imaging was done using Fiesta sequences. 2,3, and 4 chamber views were done to assess for RWMA's. Modified Simpson's rule using a short axis stack was used to calculate an ejection fraction on a dedicated work [employer]. The patient received 10 cc of Gadavist. After 10 minutes inversion recovery sequences were used to assess for infiltration and scar tissue. Velocity flow mapping performed in the ascending aorta and main pulmonary artery. CONTRAST:  10 cc  of Gadavist FINDINGS: 1. Normal left ventricular size, mild LV thickness and systolic function (LVEF =23%). There is global hypokinesis. There is near transmural (75%) subendocardial late gadolinium  enhancement in the left ventricular myocardial basal-mid inferior wall. There is subendocardial LGE (50%) in the mid-apical LV segments involving the anterior, anteroseptal and apical walls. Total LV LGE scar 34g, approximately 20% of total LV myocardial mass. LVEDV: 194 ml LVESV: 150 ml SV: 44 ml CO: 3.2 L/min Myocardial mass: 167 g 2. Normal right ventricular size and thickness. Mildly reduced LV systolic function (RVEF =37%). There are no regional wall motion abnormalities. 3.   Mildly dilated left and right atrium. 4. Normal size of the aortic root, ascending aorta and pulmonary artery. 5.  Mild mitral and tricuspid regurgitation. 6.  Normal pericardium.  No pericardial effusion. IMPRESSION: 1.  Severely reduced LV systolic function.  LVEF 23%. 2. Near transmural (75%) subendocardial LGE in the LV basal-mid inferior wall (non viable). 3. Subendocardial LGE (50%) in the mid-apical LV segments involving the anterior, anteroseptal and apical walls (appear viable). 4. Total LV LGE/scar is 34g, approximately 20% of total LV myocardial mass. 5.  Mildly reduced RV function. 6. 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 Electronically Signed   By: Debbe Odea M.D.   On: 04/27/2023 13:40   MR CARDIAC VELOCITY FLOW MAP Result Date: 04/27/2023 CLINICAL DATA:  Cardiomyopathy, LV thrombus EXAM: CARDIAC MRI TECHNIQUE: The patient was scanned on a 1.5 Tesla Siemens magnet. A dedicated cardiac coil was used. Functional imaging was done using Fiesta sequences. 2,3, and 4 chamber views were done to assess for RWMA's. Modified Simpson's rule using a short axis stack was used to calculate an ejection fraction on a dedicated work [employer], [employer]. The patient received 10 cc of Gadavist. After 10 minutes inversion recovery sequences were used to assess for infiltration and scar tissue. Velocity flow mapping performed in the ascending aorta and main pulmonary artery. CONTRAST:  10 cc  of Gadavist FINDINGS: 1. Normal left ventricular size, mild LV thickness and systolic function (LVEF =23%). There is global hypokinesis. There is near transmural (75%) subendocardial late gadolinium enhancement in the left ventricular myocardial basal-mid inferior wall. There is subendocardial LGE (50%) in the mid-apical LV segments involving the anterior, anteroseptal and apical walls. Total LV LGE scar 34g, approximately 20% of total LV myocardial  mass. LVEDV: 194 ml LVESV: 150 ml SV: 44 ml CO: 3.2 L/min Myocardial mass: 167 g 2. Normal right ventricular size and thickness. Mildly reduced LV systolic function (RVEF =37%). There are no regional wall motion abnormalities. 3.  Mildly dilated left and right atrium. 4. Normal size of the aortic root, ascending aorta and pulmonary artery. 5.  Mild mitral and tricuspid regurgitation. 6.  Normal pericardium.  No pericardial effusion. IMPRESSION: 1.  Severely reduced LV systolic function.  LVEF 23%. 2. Near transmural (75%) subendocardial LGE in the LV basal-mid inferior wall (non viable). 3. Subendocardial LGE (50%) in the mid-apical LV segments involving the anterior, anteroseptal and apical walls (appear viable). 4. Total LV LGE/scar is 34g, approximately 20% of total LV myocardial mass. 5.  Mildly reduced RV function. 6. 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 Electronically Signed   By: Debbe Odea M.D.   On: 04/27/2023 13:40   MR CARDIAC VELOCITY FLOW MAP Result Date: 04/27/2023 CLINICAL DATA:  Cardiomyopathy, LV thrombus EXAM: CARDIAC MRI TECHNIQUE: The patient was scanned on a 1.5 Tesla Siemens magnet. A dedicated cardiac coil was used. Functional imaging was done using Fiesta sequences. 2,3, and 4 chamber views were done to assess  for RWMA's. Modified Simpson's rule using a short axis stack was used to calculate an ejection fraction on a dedicated work [employer], [employer]. The patient received 10 cc of Gadavist. After 10 minutes inversion recovery sequences were used to assess for infiltration and scar tissue. Velocity flow mapping performed in the ascending aorta and main pulmonary artery. CONTRAST:  10 cc  of Gadavist FINDINGS: 1. Normal left ventricular size, mild LV thickness and systolic function (LVEF =23%). There is global hypokinesis. There is near transmural (75%) subendocardial late gadolinium enhancement in the left  ventricular myocardial basal-mid inferior wall. There is subendocardial LGE (50%) in the mid-apical LV segments involving the anterior, anteroseptal and apical walls. Total LV LGE scar 34g, approximately 20% of total LV myocardial mass. LVEDV: 194 ml LVESV: 150 ml SV: 44 ml CO: 3.2 L/min Myocardial mass: 167 g 2. Normal right ventricular size and thickness. Mildly reduced LV systolic function (RVEF =37%). There are no regional wall motion abnormalities. 3.  Mildly dilated left and right atrium. 4. Normal size of the aortic root, ascending aorta and pulmonary artery. 5.  Mild mitral and tricuspid regurgitation. 6.  Normal pericardium.  No pericardial effusion. IMPRESSION: 1.  Severely reduced LV systolic function.  LVEF 23%. 2. Near transmural (75%) subendocardial LGE in the LV basal-mid inferior wall (non viable). 3. Subendocardial LGE (50%) in the mid-apical LV segments involving the anterior, anteroseptal and apical walls (appear viable). 4. Total LV LGE/scar is 34g, approximately 20% of total LV myocardial mass. 5.  Mildly reduced RV function. 6. 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 Electronically Signed   By: Debbe Odea M.D.   On: 04/27/2023 13:40     Medications:     Scheduled Medications:  amoxicillin-clavulanate  1 tablet Oral Q12H   apixaban  10 mg Oral BID   Followed by   Melene Muller ON 05/02/2023] apixaban  5 mg Oral BID   atorvastatin  80 mg Oral QPM   azithromycin  250 mg Oral Daily   feeding supplement  237 mL Oral BID BM   furosemide  40 mg Intravenous BID   losartan  50 mg Oral Daily   nicotine  21 mg Transdermal Daily   sodium chloride flush  3 mL Intravenous Q12H   spironolactone  25 mg Oral Daily   tamsulosin  0.4 mg Oral Daily    Infusions:    PRN Medications: acetaminophen **OR** acetaminophen, morphine injection    Patient Profile   68 y.o. male with history of CAD s/p anterior MI and stent to  LAD in 2006, longstanding tobacco use. Admitted with acute respiratory failure 2/2 large R pleural effusion w/ ? RLL lung mass and acute systolic CHF.    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 Known CTO of RCA with collaterals. -EF 45% in 2013 with WMA in LAD territory -Echo this admit: LVEF < 20%, RV severely reduced, thick RV and LV, RVSP 50 mmHg, moderate MR, moderate TR.  -There was some suspicion for infiltrative cardiomyopathy given LVH. AL amyloid labs sent.  -cMRI 01/22: LVEF 23%, mildly reduced RV, cardiomyopathy appears ischemic with prior infarcts in LAD (appeared viable) and RCA (non viable) territories, 20% LGE/scar burden -Not currently candidate for cardiac cath with LV thrombus and CVA -Volume improved. Stop IV lasix. Start po torsemide 20 mg daily (not on diuretic prior to admission) -Continue Losartan 50 mg  daily -Continue spiro 25 mg daily -Start metoprolol succinate 25 mg daily -Hesitant to use SGLT2i with hygiene and history of urinary retention. Med would also be very expensive with his insurance. -He would not be a candidate for advanced therapies   2. CAD -Hx anterior MI in 2006 s/p PCI to LAD -He's had known CTO to RCA with collaterals. This was unchanged on last cath in 2017. Also had 70% d LAD (treated medically) and patent previously placed stent. -Not a candidate for cath as above -Not currently on antiplatelet d/t need for anticoagulation. Continue 80 mg atorvastatin daily.   3. LV apical thrombus -Noted on CT -On eliquis.    4. RLL lung mass Large R pleural effusion -RLL mass measuring 3.6 cm -S/p thoracentesis which yielded 1.9L fluid -Possible bronch/EBUS per Pulmonology   5. CVA -MRI brain 2 punctate infarcts left putamen -Neurology following -Suspect cardioembolic in setting of LV thrombus  6. Possible PE -Unable to exclude. CTA difficult d/t large pleural  effusion. -Continue Eliquis   7. AAA -5.9 cm AAA incidentally noted on imaging -Not stable enough for an intervention   8. CKD IIIa -Scr stable at 1.3 -Follow with diuretics   8. Tobacco use -Has smoked for many years -Encouraged cessation  Length of Stay: 4  Emireth Cockerham N, PA-C  04/28/2023, 7:42 AM  Advanced Heart Failure Team Pager [phone] (M-F; 7a - 5p)  Please contact [facility] for night-coverage after hours (5p -7a ) and weekends on [email or web address]

## 2023-04-28 NOTE — Progress Notes (Signed)
Occupational Therapy Treatment Patient Details Name: Dennis Church MRN: [id number] DOB: 1955/11/07 Today's Date: 04/28/2023   History of present illness 68 y/o male w/ history of CIDP, generalized weakness, BPH, hypertension, tobacco abuse, hyperlipidemia, MI,  presenting to the emergency department w/ c/o balance, falls, losing weight, no energy, has undergone a thoracentesis, workup found  LV apical thrombus on CTA chest, MRI positive for acute infarcts.   OT comments  Pt. sitting up in recliner visiting his friend upon arrival, with his meal tray in place. Pt. presented without his O2 [place] in place with the O2 runnning on 4L. Pt. Was assisted with reapplying the O2 [place]. SpO2 92%, with HR 74 bpms. Pt. education was provided about PLB techniques, as well as the importance of keeping the O2 [place] in place. Pt. Was seen for treatment while sitting in the recliner. Pt. Presented with a soiled hospital gown at chest level. Pt. Was provided with a fresh clean gown, and was assisted with donning the gown. Pt. Required cues for sequencing when donning the gown. Pt. was assisted with meal tray set-up, and was assisted with opening beverage containers, and cutting his food/meat in preparation for eating. Pt. Continues to benefit from OT services for ADL training, neuromuscular re-education, and pt. Education about home modification, and DME.       If plan is discharge home, recommend the following:  A lot of help with bathing/dressing/bathroom;A little help with walking and/or transfers   Equipment Recommendations  BSC/3in1    Recommendations for Other Services      Precautions / Restrictions Precautions Precautions: Fall Restrictions Weight Bearing Restrictions Per Provider Order: No       Mobility Bed Mobility               General bed mobility comments: Pt. up in recliner chair eating lunch, visiting a friend upon arrival    Transfers                  General transfer comment:  Deferred     Balance                                           ADL either performed or assessed with clinical judgement   ADL Overall ADL's : Needs assistance/impaired Eating/Feeding: Set up Eating/Feeding Details (indicate cue type and reason): complete set-up, and cues were required             Upper Body Dressing : Minimal assistance Upper Body Dressing Details (indicate cue type and reason): Donning hospital gown                       Extremity/Trunk Assessment Upper Extremity Assessment Upper Extremity Assessment: RUE deficits/detail RUE Deficits / Details: 4/5 grossly            Vision       Perception     Praxis      Cognition Arousal: Alert Behavior During Therapy: Impulsive Overall Cognitive Status: No family/caregiver present to determine baseline cognitive functioning                                          Exercises      Shoulder Instructions       General Comments  Pertinent Vitals/ Pain        No reports of pain  Home Living                                          Prior Functioning/Environment              Frequency  Min 1X/week        Progress Toward Goals  OT Goals(current goals can now be found in the care plan section)  Progress towards OT goals: Progressing toward goals  Acute Rehab OT Goals Patient Stated Goal: To return home OT Goal Formulation: With patient Time For Goal Achievement: 05/10/23 Potential to Achieve Goals: Good ADL Goals Pt Will Perform Grooming: Independently;standing Pt Will Perform Lower Body Dressing: Independently;sit to/from stand Pt Will Transfer to Toilet: Independently;ambulating;regular height toilet  Plan      Co-evaluation                 AM-PAC OT "6 Clicks" Daily Activity     Outcome Measure   Help from another person eating meals?: None Help from another person taking care of personal grooming?: A  Little Help from another person toileting, which includes using toliet, bedpan, or urinal?: A Little Help from another person bathing (including washing, rinsing, drying)?: A Little Help from another person to put on and taking off regular upper body clothing?: A Little Help from another person to put on and taking off regular lower body clothing?: A Little 6 Click Score: 19    End of Session Equipment Utilized During Treatment: Gait belt  OT Visit Diagnosis: Other abnormalities of gait and mobility (R26.89);Muscle weakness (generalized) (M62.81)   Activity Tolerance Patient tolerated treatment well   Patient Left in chair;with call bell/phone within reach   Nurse Communication        Time: 4098-1191 OT Time Calculation (min): 32 min  Charges: OT General Charges $OT Visit: 1 Visit OT Treatments $Self Care/Home Management : 8-22 mins  Olegario Messier, MS, OTR/L   Olegario Messier 04/28/2023, 4:19 PM

## 2023-04-28 NOTE — Progress Notes (Signed)
[facility] CLINIC CARDIOLOGY PROGRESS NOTE       Patient ID: GAIGE BEHRMANN MRN: [id number] DOB/AGE: [birth year] 68 y.o.  Admit date: 04/24/2023 Referring Physician Dr. Irena Cords Primary Physician Patient, No Pcp Per  Primary Cardiologist Dr. Lady Gary (last seen 2021) Reason for Consultation LV thrombus  HPI: WENZEL GARTRELL is a 68 y.o. male  with a past medical history of hypertension, hyperlipidemia, tobacco use, BPH who presented to the ED on 04/24/2023 for generalized weakness. Cardiology was consulted for further evaluation.   Interval history: -Patient seen and examined this morning, resting in bedside chair.  -BP and heart rate remained stable.  Continues to have good UOP with IV diuresis.  Renal function remained stable. LE edema significantly improved.  -He is without complaints of chest pain, shortness of breath this morning.  Review of systems complete and found to be negative unless listed above    Past Medical History:  Diagnosis Date   Anginal pain (HCC)    Coronary artery disease    Hyperlipidemia    Hypertension    Myocardial infarction [facility])    Renal disorder    kidney stones    Past Surgical History:  Procedure Laterality Date   CARDIAC CATHETERIZATION Left 04/25/2015   Procedure: Left Heart Cath and Coronary Angiography;  Surgeon: Dalia Heading, MD;  Location: [facility] INVASIVE CV LAB;  Service: Cardiovascular;  Laterality: Left;   CAROTID ENDARTERECTOMY Left    CORONARY ANGIOPLASTY WITH STENT PLACEMENT     LITHOTRIPSY      Medications Prior to Admission  Medication Sig Dispense Refill Last Dose/Taking   amLODipine (NORVASC) 5 MG tablet Take 5 mg by mouth daily.   04/24/2023 Morning   aspirin EC 81 MG tablet Take 81 mg by mouth daily.   04/24/2023 Morning   atorvastatin (LIPITOR) 80 MG tablet Take 80 mg by mouth every evening.   04/23/2023   clopidogrel (PLAVIX) 75 MG tablet Take 75 mg by mouth daily.   04/24/2023 Morning   isosorbide mononitrate (IMDUR) 60 MG 24 hr  tablet Take 60 mg by mouth daily.   04/24/2023 Morning   metoprolol tartrate (LOPRESSOR) 25 MG tablet Take 25 mg by mouth 2 (two) times daily.   04/24/2023 Morning   nitroGLYCERIN (NITROSTAT) 0.4 MG SL tablet Place 0.4 mg under the tongue every 5 (five) minutes x 3 doses as needed.   Taking As Needed   tamsulosin (FLOMAX) 0.4 MG CAPS capsule Take 1 capsule (0.4 mg total) by mouth daily. 30 capsule 1 04/24/2023 Morning   Social History   Socioeconomic History   Marital status: Single    Spouse name: Not on file   Number of children: Not on file   Years of education: Not on file   Highest education level: Not on file  Occupational History   Not on file  Tobacco Use   Smoking status: Former   Smokeless tobacco: Never  Vaping Use   Vaping status: Never Used  Substance and Sexual Activity   Alcohol use: No   Drug use: Not Currently   Sexual activity: Not on file  Other Topics Concern   Not on file  Social History Narrative   Not on file   Social Drivers of Health   Financial Resource Strain: Not on file  Food Insecurity: No Food Insecurity (04/25/2023)   Hunger Vital Sign    Worried About Running Out of Food in the Last Year: Never true    Ran Out of Food in the  Last Year: Never true  Transportation Needs: No Transportation Needs (04/25/2023)   PRAPARE - Administrator, Civil Service (Medical): No    Lack of Transportation (Non-Medical): No  Physical Activity: Not on file  Stress: Not on file  Social Connections: Socially Isolated (04/25/2023)   Social Connection and Isolation Panel [NHANES]    Frequency of Communication with Friends and Family: Once a week    Frequency of Social Gatherings with Friends and Family: Once a week    Attends Religious Services: Never    Database administrator or Organizations: No    Attends Banker Meetings: Never    Marital Status: Divorced  Catering manager Violence: Not At Risk (04/25/2023)   Humiliation, Afraid, Rape,  and Kick questionnaire    Fear of Current or Ex-Partner: No    Emotionally Abused: No    Physically Abused: No    Sexually Abused: No    History reviewed. No pertinent family history.   Vitals:   04/27/23 1556 04/27/23 2054 04/28/23 0057 04/28/23 0933  BP: 114/72 126/66 (!) 113/58 101/78  Pulse: 83 87 88 96  Resp: 14 16 16 16   Temp: 97.9 F (36.6 C) 98.1 F (36.7 C) 97.9 F (36.6 C) 97.7 F (36.5 C)  TempSrc:      SpO2: 98% 97% 95% 96%  Weight:      Height:        PHYSICAL EXAM General: Ill-appearing male, well nourished, in no acute distress resting comfortably in hospital bed. HEENT: Normocephalic and atraumatic. Neck: No JVD.  Lungs: Normal respiratory effort on 3L [place]. Clear bilaterally to auscultation. No wheezes, crackles, rhonchi.  Heart: HRRR. Normal S1 and S2 without gallops or murmurs.  Abdomen: Non-distended appearing.  Msk: Normal strength and tone for age. Extremities: Warm and well perfused. No clubbing, cyanosis.  Trace edema.  Neuro: Alert and oriented X 3. Psych: Answers questions appropriately.   Labs: Basic Metabolic Panel: Recent Labs    04/27/23 0924 04/28/23 0424  NA 132* 129*  K 3.3* 4.0  CL 94* 91*  CO2 29 28  GLUCOSE 82 91  BUN 28* 27*  CREATININE 1.44* 1.33*  CALCIUM 8.4* 8.3*  MG 1.9 2.3   Liver Function Tests: No results for input(s): "AST", "ALT", "ALKPHOS", "BILITOT", "PROT", "ALBUMIN" in the last 72 hours.  No results for input(s): "LIPASE", "AMYLASE" in the last 72 hours. CBC: Recent Labs    04/27/23 0924 04/28/23 0424  WBC 5.2 5.3  HGB 14.6 14.6  HCT 45.4 44.3  MCV 79.1* 76.8*  PLT 90* 99*   Cardiac Enzymes: No results for input(s): "CKTOTAL", "CKMB", "CKMBINDEX", "TROPONINIHS" in the last 72 hours.  BNP: No results for input(s): "BNP" in the last 72 hours.  D-Dimer: No results for input(s): "DDIMER" in the last 72 hours. Hemoglobin A1C: No results for input(s): "HGBA1C" in the last 72 hours.  Fasting Lipid  Panel: No results for input(s): "CHOL", "HDL", "LDLCALC", "TRIG", "CHOLHDL", "LDLDIRECT" in the last 72 hours.  Thyroid Function Tests: No results for input(s): "TSH", "T4TOTAL", "T3FREE", "THYROIDAB" in the last 72 hours.  Invalid input(s): "FREET3"  Anemia Panel: No results for input(s): "VITAMINB12", "FOLATE", "FERRITIN", "TIBC", "IRON", "RETICCTPCT" in the last 72 hours.   Radiology: MR CARDIAC MORPHOLOGY W WO CONTRAST Result Date: 04/27/2023 CLINICAL DATA:  Cardiomyopathy, LV thrombus EXAM: CARDIAC MRI TECHNIQUE: The patient was scanned on a 1.5 Tesla Siemens magnet. A dedicated cardiac coil was used. Functional imaging was done using Fiesta sequences.  2,3, and 4 chamber views were done to assess for RWMA's. Modified Simpson's rule using a short axis stack was used to calculate an ejection fraction on a dedicated work Research officer, trade union. The patient received 10 cc of Gadavist. After 10 minutes inversion recovery sequences were used to assess for infiltration and scar tissue. Velocity flow mapping performed in the ascending aorta and main pulmonary artery. CONTRAST:  10 cc  of Gadavist FINDINGS: 1. Normal left ventricular size, mild LV thickness and systolic function (LVEF =23%). There is global hypokinesis. There is near transmural (75%) subendocardial late gadolinium enhancement in the left ventricular myocardial basal-mid inferior wall. There is subendocardial LGE (50%) in the mid-apical LV segments involving the anterior, anteroseptal and apical walls. Total LV LGE scar 34g, approximately 20% of total LV myocardial mass. LVEDV: 194 ml LVESV: 150 ml SV: 44 ml CO: 3.2 L/min Myocardial mass: 167 g 2. Normal right ventricular size and thickness. Mildly reduced LV systolic function (RVEF =37%). There are no regional wall motion abnormalities. 3.  Mildly dilated left and right atrium. 4. Normal size of the aortic root, ascending aorta and pulmonary artery. 5.  Mild mitral and tricuspid  regurgitation. 6.  Normal pericardium.  No pericardial effusion. IMPRESSION: 1.  Severely reduced LV systolic function.  LVEF 23%. 2. Near transmural (75%) subendocardial LGE in the LV basal-mid inferior wall (non viable). 3. Subendocardial LGE (50%) in the mid-apical LV segments involving the anterior, anteroseptal and apical walls (appear viable). 4. Total LV LGE/scar is 34g, approximately 20% of total LV myocardial mass. 5.  Mildly reduced RV function. 6. 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 Electronically Signed   By: Debbe Odea M.D.   On: 04/27/2023 13:40   MR CARDIAC VELOCITY FLOW MAP Result Date: 04/27/2023 CLINICAL DATA:  Cardiomyopathy, LV thrombus EXAM: CARDIAC MRI TECHNIQUE: The patient was scanned on a 1.5 Tesla Siemens magnet. A dedicated cardiac coil was used. Functional imaging was done using Fiesta sequences. 2,3, and 4 chamber views were done to assess for RWMA's. Modified Simpson's rule using a short axis stack was used to calculate an ejection fraction on a dedicated work Research officer, trade union. The patient received 10 cc of Gadavist. After 10 minutes inversion recovery sequences were used to assess for infiltration and scar tissue. Velocity flow mapping performed in the ascending aorta and main pulmonary artery. CONTRAST:  10 cc  of Gadavist FINDINGS: 1. Normal left ventricular size, mild LV thickness and systolic function (LVEF =23%). There is global hypokinesis. There is near transmural (75%) subendocardial late gadolinium enhancement in the left ventricular myocardial basal-mid inferior wall. There is subendocardial LGE (50%) in the mid-apical LV segments involving the anterior, anteroseptal and apical walls. Total LV LGE scar 34g, approximately 20% of total LV myocardial mass. LVEDV: 194 ml LVESV: 150 ml SV: 44 ml CO: 3.2 L/min Myocardial mass: 167 g 2. Normal right ventricular size and thickness. Mildly  reduced LV systolic function (RVEF =37%). There are no regional wall motion abnormalities. 3.  Mildly dilated left and right atrium. 4. Normal size of the aortic root, ascending aorta and pulmonary artery. 5.  Mild mitral and tricuspid regurgitation. 6.  Normal pericardium.  No pericardial effusion. IMPRESSION: 1.  Severely reduced LV systolic function.  LVEF 23%. 2. Near transmural (75%) subendocardial LGE in the LV basal-mid inferior wall (non viable). 3. Subendocardial LGE (50%) in the mid-apical LV segments involving the anterior, anteroseptal and apical walls (appear  viable). 4. Total LV LGE/scar is 34g, approximately 20% of total LV myocardial mass. 5.  Mildly reduced RV function. 6. 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 Electronically Signed   By: Debbe Odea M.D.   On: 04/27/2023 13:40   MR CARDIAC VELOCITY FLOW MAP Result Date: 04/27/2023 CLINICAL DATA:  Cardiomyopathy, LV thrombus EXAM: CARDIAC MRI TECHNIQUE: The patient was scanned on a 1.5 Tesla Siemens magnet. A dedicated cardiac coil was used. Functional imaging was done using Fiesta sequences. 2,3, and 4 chamber views were done to assess for RWMA's. Modified Simpson's rule using a short axis stack was used to calculate an ejection fraction on a dedicated work Research officer, trade union. The patient received 10 cc of Gadavist. After 10 minutes inversion recovery sequences were used to assess for infiltration and scar tissue. Velocity flow mapping performed in the ascending aorta and main pulmonary artery. CONTRAST:  10 cc  of Gadavist FINDINGS: 1. Normal left ventricular size, mild LV thickness and systolic function (LVEF =23%). There is global hypokinesis. There is near transmural (75%) subendocardial late gadolinium enhancement in the left ventricular myocardial basal-mid inferior wall. There is subendocardial LGE (50%) in the mid-apical LV segments involving the anterior,  anteroseptal and apical walls. Total LV LGE scar 34g, approximately 20% of total LV myocardial mass. LVEDV: 194 ml LVESV: 150 ml SV: 44 ml CO: 3.2 L/min Myocardial mass: 167 g 2. Normal right ventricular size and thickness. Mildly reduced LV systolic function (RVEF =37%). There are no regional wall motion abnormalities. 3.  Mildly dilated left and right atrium. 4. Normal size of the aortic root, ascending aorta and pulmonary artery. 5.  Mild mitral and tricuspid regurgitation. 6.  Normal pericardium.  No pericardial effusion. IMPRESSION: 1.  Severely reduced LV systolic function.  LVEF 23%. 2. Near transmural (75%) subendocardial LGE in the LV basal-mid inferior wall (non viable). 3. Subendocardial LGE (50%) in the mid-apical LV segments involving the anterior, anteroseptal and apical walls (appear viable). 4. Total LV LGE/scar is 34g, approximately 20% of total LV myocardial mass. 5.  Mildly reduced RV function. 6. 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 Electronically Signed   By: Debbe Odea M.D.   On: 04/27/2023 13:40   ECHOCARDIOGRAM COMPLETE Result Date: 04/26/2023    ECHOCARDIOGRAM REPORT   Patient Name:   RIVALDO BECKNELL Loeper Date of Exam: 04/25/2023 Medical Rec #:  [id number]     Height:       65.0 in Accession #:    [id number]    Weight:       157.0 lb Date of Birth:  [birth year]     BSA:          1.785 m Patient Age:    67 years      BP:           117/105 mmHg Patient Gender: M             HR:           81 bpm. Exam Location:  [facility] Procedure: 2D Echo, Cardiac Doppler, Color Doppler and Intracardiac            Opacification Agent Indications:     Other abnormalities of the heart  History:         Patient has no prior history of Echocardiogram examinations.  CAD, Stroke; Risk Factors:Hypertension, Dyslipidemia and                  Current Smoker. CKD, Mural thrombus.  Sonographer:     Mikki Harbor Referring Phys:  [id number]  Dyani Babel Diagnosing Phys: Rozell Searing Custovic IMPRESSIONS  1. Left ventricular ejection fraction, by estimation, is <20%. The left ventricle has severely decreased function. The left ventricle demonstrates global hypokinesis. The left ventricular internal cavity size was moderately dilated. Left ventricular diastolic parameters are consistent with Grade I diastolic dysfunction (impaired relaxation).  2. Severe RV volume and pressure overload. Right ventricular systolic function is severely reduced. The right ventricular size is moderately enlarged. There is moderately elevated pulmonary artery systolic pressure. The estimated right ventricular systolic pressure is 50.0 mmHg.  3. Left atrial size was severely dilated.  4. Right atrial size was severely dilated.  5. The mitral valve is grossly normal. Moderate mitral valve regurgitation.  6. Tricuspid valve regurgitation is moderate.  7. The aortic valve is grossly normal. Aortic valve regurgitation is trivial. Aortic valve sclerosis/calcification is present, without any evidence of aortic stenosis. FINDINGS  Left Ventricle: Left ventricular ejection fraction, by estimation, is <20%. The left ventricle has severely decreased function. The left ventricle demonstrates global hypokinesis. Definity contrast agent was given IV to delineate the left ventricular endocardial borders. The left ventricular internal cavity size was moderately dilated. There is borderline left ventricular hypertrophy. Left ventricular diastolic parameters are consistent with Grade I diastolic dysfunction (impaired relaxation). Right Ventricle: Severe RV volume and pressure overload. The right ventricular size is moderately enlarged. No increase in right ventricular wall thickness. Right ventricular systolic function is severely reduced. There is moderately elevated pulmonary artery systolic pressure. The tricuspid regurgitant velocity is 2.96 m/s, and with an assumed right atrial pressure of 15  mmHg, the estimated right ventricular systolic pressure is 50.0 mmHg. Left Atrium: Left atrial size was severely dilated. Right Atrium: Right atrial size was severely dilated. Pericardium: There is no evidence of pericardial effusion. Mitral Valve: The mitral valve is grossly normal. Moderate mitral valve regurgitation. MV peak gradient, 2.3 mmHg. The mean mitral valve gradient is 1.0 mmHg. Tricuspid Valve: The tricuspid valve is grossly normal. Tricuspid valve regurgitation is moderate. Aortic Valve: The aortic valve is grossly normal. Aortic valve regurgitation is trivial. Aortic valve sclerosis/calcification is present, without any evidence of aortic stenosis. Aortic valve mean gradient measures 8.0 mmHg. Aortic valve peak gradient measures 14.3 mmHg. Aortic valve area, by VTI measures 0.95 cm. Pulmonic Valve: The pulmonic valve was grossly normal. Pulmonic valve regurgitation is not visualized. Aorta: The aortic root is normal in size and structure. IAS/Shunts: No atrial level shunt detected by color flow Doppler.  LEFT VENTRICLE PLAX 2D LVIDd:         4.20 cm LVIDs:         3.20 cm LV PW:         1.30 cm LV IVS:        1.30 cm LVOT diam:     1.90 cm LV SV:         35 LV SV Index:   20 LVOT Area:     2.84 cm  RIGHT VENTRICLE RV Basal diam:  4.45 cm RV Mid diam:    4.30 cm RV S prime:     6.66 cm/s LEFT ATRIUM             Index        RIGHT ATRIUM  Index LA diam:        4.40 cm 2.47 cm/m   RA Area:     22.10 cm LA Vol (A2C):   88.9 ml 49.81 ml/m  RA Volume:   79.50 ml  44.54 ml/m LA Vol (A4C):   79.3 ml 44.43 ml/m LA Biplane Vol: 85.7 ml 48.02 ml/m  AORTIC VALVE                     PULMONIC VALVE AV Area (Vmax):    1.00 cm      PV Vmax:       0.92 m/s AV Area (Vmean):   0.95 cm      PV Peak grad:  3.4 mmHg AV Area (VTI):     0.95 cm AV Vmax:           189.00 cm/s AV Vmean:          127.000 cm/s AV VTI:            0.369 m AV Peak Grad:      14.3 mmHg AV Mean Grad:      8.0 mmHg LVOT Vmax:          66.60 cm/s LVOT Vmean:        42.500 cm/s LVOT VTI:          0.124 m LVOT/AV VTI ratio: 0.34  AORTA Ao Root diam: 3.10 cm MITRAL VALVE               TRICUSPID VALVE MV Area (PHT): 3.10 cm    TR Peak grad:   35.0 mmHg MV Area VTI:   1.44 cm    TR Vmax:        296.00 cm/s MV Peak grad:  2.3 mmHg MV Mean grad:  1.0 mmHg    SHUNTS MV Vmax:       0.76 m/s    Systemic VTI:  0.12 m MV Vmean:      37.1 cm/s   Systemic Diam: 1.90 cm MV Decel Time: 245 msec MV E velocity: 73.70 cm/s MV A velocity: 64.30 cm/s MV E/A ratio:  1.15 Designer, multimedia signed by Clotilde Dieter Signature Date/Time: 04/26/2023/9:17:35 AM    Final    CT ANGIO HEAD NECK W WO CM Result Date: 04/25/2023 CLINICAL DATA:  Neuro deficit, acute, stroke suspected EXAM: CT ANGIOGRAPHY HEAD AND NECK WITH AND WITHOUT CONTRAST TECHNIQUE: Multidetector CT imaging of the head and neck was performed using the standard protocol during bolus administration of intravenous contrast. Multiplanar CT image reconstructions and MIPs were obtained to evaluate the vascular anatomy. Carotid stenosis measurements (when applicable) are obtained utilizing NASCET criteria, using the distal internal carotid diameter as the denominator. RADIATION DOSE REDUCTION: This exam was performed according to the departmental dose-optimization program which includes automated exposure control, adjustment of the mA and/or kV according to patient size and/or use of iterative reconstruction technique. CONTRAST:  75mL OMNIPAQUE IOHEXOL 350 MG/ML SOLN COMPARISON:  Same day brain MR FINDINGS: CT HEAD FINDINGS Brain: No hemorrhage. No hydrocephalus. No extra-axial fluid collection. No mass effect. No mass lesion. 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 Generalized volume loss without lobar predominance. Vascular: No hyperdense vessel or unexpected calcification. Skull: Normal. Negative for fracture or focal lesion. Sinuses/Orbits: No middle ear or mastoid effusion. Paranasal  sinuses are clear. Bilateral lens replacement. Orbits are otherwise unremarkable. Other: None. Review of the MIP images confirms the above findings CTA NECK FINDINGS Aortic arch: Standard branching. Imaged  portion shows no evidence of aneurysm or dissection. Mild-to-moderate narrowing in the right subclavian artery Right carotid system: Moderate narrowing (approximately 50%) in the distal cervical ICA on the right (series 12, image 103). Left carotid system: No evidence of dissection, stenosis (50% or greater), or occlusion. Mild narrowing in the distal cervical ICA on the left. 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 Moderate to severe focal narrowing in the V1 segment of the left vertebral artery which arises from the aortic arch. Moderate narrowing in the V2 segment of the left vertebral artery. Skeleton: Negative. Other neck: Negative. Upper chest: Centrilobular and paraseptal emphysema. Small left pleural effusion. 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 Moderate stenosis in the cavernous segment of the left ICA (series 14, image 83). Moderate narrowing in the distal A2 segment of the right ACA (series 10, image 54). 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 No aneurysm. No vascular malformation. Venous sinuses: As permitted by contrast timing, patent. Anatomic variants: None Review of the MIP images confirms the above findings IMPRESSION: 1. No hemorrhage or CT evidence of an acute cortical infarct. Infarcts seen on same day brain MRI are not visualized on this exam due to CT technique. 2. No emergent large vessel occlusion. 3. 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 There is sever stenosis in the V1 segment of the left vertebral artery. 4. Moderate  narrowing (approximately 50%) in the distal cervical ICA on the right. 5. Moderate to severe narrowing in the cavernous segments of bilateral ICAs. Emphysema (ICD10-J43.9). Electronically Signed   By: Lorenza Cambridge M.D.   On: 04/25/2023 17:52   US THORACENTESIS ASP PLEURAL SPACE W/IMG GUIDE Result Date: 04/25/2023 INDICATION: Patient is a 68 y/o male with history of CAD, HLD, HTN, MI, BPH and tobacco abuse. Patient presents for a diagnostic and therapeutic thoracentesis due to a right pleural effusion. EXAM: ULTRASOUND GUIDED RIGHT THORACENTESIS MEDICATIONS: 12mL of lidocaine 1% COMPLICATIONS: None immediate. PROCEDURE: An ultrasound guided thoracentesis was thoroughly discussed with the patient and questions answered. The benefits, risks, alternatives and complications were also discussed. The patient understands and wishes to proceed with the procedure. Written consent was obtained. Ultrasound was performed to localize and mark an adequate pocket of fluid in the right chest. The area was then prepped and draped in the normal sterile fashion. 1% Lidocaine was used for local anesthesia. Under ultrasound guidance a 6 Fr Safe-T-Centesis catheter was introduced. Thoracentesis was performed. The catheter was removed and a dressing applied. FINDINGS: A total of approximately 1.9L of clear, yellow fluid was removed. Samples were sent to the laboratory as requested by the clinical team. IMPRESSION: Successful ultrasound guided right thoracentesis yielding 1.9L of pleural fluid. Performed by Philipp Ovens PA-C Electronically Signed   By: Malachy Moan M.D.   On: 04/25/2023 16:30   DG Chest Port 1 View Result Date: 04/25/2023 CLINICAL DATA:  68 year old male status post right thoracentesis EXAM: PORTABLE CHEST 1 VIEW COMPARISON:  Prior chest x-ray 04/24/2023 FINDINGS: No evidence of pneumothorax following right-sided thoracentesis. Significant interval reduction right pleural effusion. Stable cardiomegaly and  mild pulmonary edema. IMPRESSION: No evidence of pneumothorax or other complication following right-sided thoracentesis. Electronically Signed   By: Malachy Moan M.D.   On: 04/25/2023 16:19   MR BRAIN W WO CONTRAST Result Date: 04/25/2023  CLINICAL DATA:  CNS neoplasm staging EXAM: MRI HEAD WITHOUT AND WITH CONTRAST TECHNIQUE: Multiplanar, multiecho pulse sequences of the brain and surrounding structures were obtained without and with intravenous contrast. CONTRAST:  7.58mL GADAVIST GADOBUTROL 1 MMOL/ML IV SOLN COMPARISON:  Head CT from yesterday FINDINGS: Brain: 2 punctate areas of restricted diffusion at the left upper putamen. Chronic infarcts scattered along the cerebral cortex, especially left superior frontal and right occipital. Ischemic gliosis in the cerebral Crocket matter with chronic lacunes in the bilateral thalamus. Ischemic gliosis is extensive in the cerebral Lutzke matter. Brain atrophy is present. There are a few chronic blood products in the deep cerebellum, brainstem, left periatrial Lorah matter, and left thalamus which are likely related to chronic small vessel disease. No abnormal enhancement Vascular: Absent right vertebral flow void just beyond the dura. Skull and upper cervical spine: Normal marrow signal Sinuses/Orbits: Negative IMPRESSION: 1. Two small acute infarcts in the left putamen. 2. 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 3. Slow or absent flow in the right vertebral artery, likely chronic in the absence of acute infarct in this distribution. 4. No evidence of metastatic disease. Electronically Signed   By: Tiburcio Pea M.D.   On: 04/25/2023 09:17   CT Angio Chest Pulmonary Embolism (PE) W or WO Contrast Result Date: 04/24/2023 CLINICAL DATA:  Weight loss, fatigue, weakness, sepsis EXAM: CT ANGIOGRAPHY CHEST CT ABDOMEN AND PELVIS WITH CONTRAST TECHNIQUE: Multidetector CT imaging of the chest was performed using the standard protocol during bolus  administration of intravenous contrast. Multiplanar CT image reconstructions and MIPs were obtained to evaluate the vascular anatomy. Multidetector CT imaging of the abdomen and pelvis was performed using the standard protocol during bolus administration of intravenous contrast. RADIATION DOSE REDUCTION: This exam was performed according to the departmental dose-optimization program which includes automated exposure control, adjustment of the mA and/or kV according to patient size and/or use of iterative reconstruction technique. CONTRAST:  75mL OMNIPAQUE IOHEXOL 350 MG/ML SOLN COMPARISON:  04/24/2023, 06/30/2020, 07/12/2016 FINDINGS: CTA CHEST FINDINGS Cardiovascular: There is technically adequate opacification of the main pulmonary artery. Mixing artifact is identified. There is decreased contrast enhancement of the right middle and right lower lobe pulmonary arteries, which could be due to pulmonary emboli versus decreased perfusion due to presumed underlying chronic consolidation. No other filling defects are identified. The heart is enlarged, with mild left ventricular dilatation. Mural thrombus is seen within the left ventricular apex, reference image 7 of series 9 on the abdominal CT exam. There is reflux of contrast into the hepatic veins, suggesting an element of right sided cardiac dysfunction. No pericardial effusion. Normal caliber of the thoracic aorta. Evaluation of the aortic lumen is limited due to timing of the contrast bolus. Atherosclerosis of the aorta and coronary vasculature. Mediastinum/Nodes: There are numerous subcentimeter mediastinal and hilar lymph nodes, without evidence of pathologic adenopathy. Thyroid, trachea, and esophagus are grossly normal. Lungs/Pleura: There is a large right pleural effusion, volume estimated in excess of 2 L. There is rounded masslike consolidation of the right lower lobe in the infrahilar region, measuring up to 3.6 cm reference image 91/2. Neoplasm cannot be  excluded. Peripheral consolidation and volume loss elsewhere within the right lower lobe consistent with atelectasis. Indeterminate 4 mm left upper lobe pulmonary nodule reference image 19/3. Trace left pleural effusion. Upper lobe predominant emphysema.  No pneumothorax. Musculoskeletal: No acute or destructive bony abnormalities. Reconstructed images demonstrate no additional findings. Review of the MIP images confirms the above findings. CT ABDOMEN  and PELVIS FINDINGS Hepatobiliary: No focal liver abnormality is seen. No gallstones, gallbladder wall thickening, or biliary dilatation. Pancreas: Unremarkable. No pancreatic ductal dilatation or surrounding inflammatory changes. Spleen: Normal in size without focal abnormality. Adrenals/Urinary Tract: Mild bilateral renal cortical atrophy and scattered areas of renal cortical scarring, right greater than left. No urinary tract calculi or obstructive uropathy. Bladder is moderately distended, with multiple bladder diverticula consistent with chronic bladder outlet obstruction. Nonobstructing 2.1 cm bladder calculus layers dependently. The adrenals are unremarkable. Stomach/Bowel: No bowel obstruction or ileus. Distal colonic diverticulosis without diverticulitis. No bowel wall thickening or inflammatory change. Vascular/Lymphatic: There is a 5.9 cm infrarenal abdominal aortic aneurysm, with no evidence of leak or rupture. Chronic calcification is seen within the ventral aspect of the thrombosed portion of the aneurysm. Diffuse aortic atherosclerosis. No pathologic adenopathy. Reproductive: Marked enlargement of the prostate measuring 5.8 x 5.6 cm. Other: Moderate ascites, most pronounced within the bilateral flanks. No free intraperitoneal gas. No abdominal wall hernia. Musculoskeletal: Diffuse body wall edema. No acute or destructive bony abnormalities. Reconstructed images demonstrate no additional findings. Review of the MIP images confirms the above findings.  IMPRESSION: Chest: 1. Decreased enhancement of the right middle and right lower lobe pulmonary arteries, without definitive filling defect identified. Differential diagnosis would include decreased perfusion due to chronic consolidation versus pulmonary embolus. 2. Rounded masslike consolidation in the right lower lobe infrahilar region measuring up to 3.6 cm. The appearance is concerning for neoplasm, though underlying infection cannot be excluded. Pulmonology follow-up recommended. 3. Large right pleural effusion volume estimated in excess of 2 L. 4. Volume loss and peripheral consolidation within the right lower lobe, consistent with atelectasis. It is unclear whether this is postobstructive collapse or sys compressive atelectasis from the right pleural effusion. 5. Cardiomegaly, with prominent left ventricular dilatation. Small mural thrombi at the left ventricular apex best seen on the delayed contrast series on the corresponding CT abdomen exam. Echocardiography recommended. 6. Reflux of contrast into the hepatic veins consistent with cardiac dysfunction. 7.  Aortic Atherosclerosis (ICD10-I70.0). Abdomen/pelvis: 1. 5.9 cm infrarenal abdominal aortic aneurysm. No evidence of aortic leak or rupture. Recommend follow-up CT or MR as appropriate in 6 months and referral to or continued care with vascular specialist. (Ref.: J Vasc Surg. 2018; 67:2-77 and J Am Coll Radiol 2013;10(10):789-794.) 2. Moderate ascites. 3. Enlarged prostate, with evidence of chronic bladder outlet obstruction. 4. Nonobstructing 2.1 cm bladder calculus. 5. Sigmoid diverticulosis without diverticulitis. 6.  Aortic Atherosclerosis (ICD10-I70.0). 7. Diffuse body wall edema. Critical Value/emergent results were called by telephone at the time of interpretation on 04/24/2023 at 550 pm to provider [facility] , who verbally acknowledged these results. Electronically Signed   By: Sharlet Salina M.D.   On: 04/24/2023 18:02   CT ABDOMEN PELVIS W  CONTRAST Result Date: 04/24/2023 CLINICAL DATA:  Weight loss, fatigue, weakness, sepsis EXAM: CT ANGIOGRAPHY CHEST CT ABDOMEN AND PELVIS WITH CONTRAST TECHNIQUE: Multidetector CT imaging of the chest was performed using the standard protocol during bolus administration of intravenous contrast. Multiplanar CT image reconstructions and MIPs were obtained to evaluate the vascular anatomy. Multidetector CT imaging of the abdomen and pelvis was performed using the standard protocol during bolus administration of intravenous contrast. RADIATION DOSE REDUCTION: This exam was performed according to the departmental dose-optimization program which includes automated exposure control, adjustment of the mA and/or kV according to patient size and/or use of iterative reconstruction technique. CONTRAST:  75mL OMNIPAQUE IOHEXOL 350 MG/ML SOLN COMPARISON:  04/24/2023, 06/30/2020, 07/12/2016 FINDINGS:  CTA CHEST FINDINGS Cardiovascular: There is technically adequate opacification of the main pulmonary artery. Mixing artifact is identified. There is decreased contrast enhancement of the right middle and right lower lobe pulmonary arteries, which could be due to pulmonary emboli versus decreased perfusion due to presumed underlying chronic consolidation. No other filling defects are identified. The heart is enlarged, with mild left ventricular dilatation. Mural thrombus is seen within the left ventricular apex, reference image 7 of series 9 on the abdominal CT exam. There is reflux of contrast into the hepatic veins, suggesting an element of right sided cardiac dysfunction. No pericardial effusion. Normal caliber of the thoracic aorta. Evaluation of the aortic lumen is limited due to timing of the contrast bolus. Atherosclerosis of the aorta and coronary vasculature. Mediastinum/Nodes: There are numerous subcentimeter mediastinal and hilar lymph nodes, without evidence of pathologic adenopathy. Thyroid, trachea, and esophagus are  grossly normal. Lungs/Pleura: There is a large right pleural effusion, volume estimated in excess of 2 L. There is rounded masslike consolidation of the right lower lobe in the infrahilar region, measuring up to 3.6 cm reference image 91/2. Neoplasm cannot be excluded. Peripheral consolidation and volume loss elsewhere within the right lower lobe consistent with atelectasis. Indeterminate 4 mm left upper lobe pulmonary nodule reference image 19/3. Trace left pleural effusion. Upper lobe predominant emphysema.  No pneumothorax. Musculoskeletal: No acute or destructive bony abnormalities. Reconstructed images demonstrate no additional findings. Review of the MIP images confirms the above findings. CT ABDOMEN and PELVIS FINDINGS Hepatobiliary: No focal liver abnormality is seen. No gallstones, gallbladder wall thickening, or biliary dilatation. Pancreas: Unremarkable. No pancreatic ductal dilatation or surrounding inflammatory changes. Spleen: Normal in size without focal abnormality. Adrenals/Urinary Tract: Mild bilateral renal cortical atrophy and scattered areas of renal cortical scarring, right greater than left. No urinary tract calculi or obstructive uropathy. Bladder is moderately distended, with multiple bladder diverticula consistent with chronic bladder outlet obstruction. Nonobstructing 2.1 cm bladder calculus layers dependently. The adrenals are unremarkable. Stomach/Bowel: No bowel obstruction or ileus. Distal colonic diverticulosis without diverticulitis. No bowel wall thickening or inflammatory change. Vascular/Lymphatic: There is a 5.9 cm infrarenal abdominal aortic aneurysm, with no evidence of leak or rupture. Chronic calcification is seen within the ventral aspect of the thrombosed portion of the aneurysm. Diffuse aortic atherosclerosis. No pathologic adenopathy. Reproductive: Marked enlargement of the prostate measuring 5.8 x 5.6 cm. Other: Moderate ascites, most pronounced within the bilateral  flanks. No free intraperitoneal gas. No abdominal wall hernia. Musculoskeletal: Diffuse body wall edema. No acute or destructive bony abnormalities. Reconstructed images demonstrate no additional findings. Review of the MIP images confirms the above findings. IMPRESSION: Chest: 1. Decreased enhancement of the right middle and right lower lobe pulmonary arteries, without definitive filling defect identified. Differential diagnosis would include decreased perfusion due to chronic consolidation versus pulmonary embolus. 2. Rounded masslike consolidation in the right lower lobe infrahilar region measuring up to 3.6 cm. The appearance is concerning for neoplasm, though underlying infection cannot be excluded. Pulmonology follow-up recommended. 3. Large right pleural effusion volume estimated in excess of 2 L. 4. Volume loss and peripheral consolidation within the right lower lobe, consistent with atelectasis. It is unclear whether this is postobstructive collapse or sys compressive atelectasis from the right pleural effusion. 5. Cardiomegaly, with prominent left ventricular dilatation. Small mural thrombi at the left ventricular apex best seen on the delayed contrast series on the corresponding CT abdomen exam. Echocardiography recommended. 6. Reflux of contrast into the hepatic veins consistent with cardiac dysfunction.  7.  Aortic Atherosclerosis (ICD10-I70.0). Abdomen/pelvis: 1. 5.9 cm infrarenal abdominal aortic aneurysm. No evidence of aortic leak or rupture. Recommend follow-up CT or MR as appropriate in 6 months and referral to or continued care with vascular specialist. (Ref.: J Vasc Surg. 2018; 67:2-77 and J Am Coll Radiol 2013;10(10):789-794.) 2. Moderate ascites. 3. Enlarged prostate, with evidence of chronic bladder outlet obstruction. 4. Nonobstructing 2.1 cm bladder calculus. 5. Sigmoid diverticulosis without diverticulitis. 6.  Aortic Atherosclerosis (ICD10-I70.0). 7. Diffuse body wall edema. Critical  Value/emergent results were called by telephone at the time of interpretation on 04/24/2023 at 550 pm to provider [facility] , who verbally acknowledged these results. Electronically Signed   By: Sharlet Salina M.D.   On: 04/24/2023 18:02   CT Head Wo Contrast Result Date: 04/24/2023 CLINICAL DATA:  Losing weight, decreased energy for months. EXAM: CT HEAD WITHOUT CONTRAST TECHNIQUE: Contiguous axial images were obtained from the base of the skull through the vertex without intravenous contrast. RADIATION DOSE REDUCTION: This exam was performed according to the departmental dose-optimization program which includes automated exposure control, adjustment of the mA and/or kV according to patient size and/or use of iterative reconstruction technique. COMPARISON:  CT head dated 07/24/2004. FINDINGS: Brain: No evidence of acute infarction, hemorrhage, hydrocephalus, extra-axial collection or mass lesion/mass effect. There is small to moderate volume encephalomalacia of the left frontal lobe superomedially and small-to-moderate volume encephalomalacia of the right occipital lobe. There is mild cerebral volume loss with associated ex vacuo dilatation. Periventricular [place] matter hypoattenuation likely represents chronic small vessel ischemic disease. Vascular: There are vascular calcifications in the carotid siphons. Skull: Normal. Negative for fracture or focal lesion. Sinuses/Orbits: No acute finding. Other: None. IMPRESSION: 1. No acute intracranial process. 2. Small to moderate volume encephalomalacia of the left frontal lobe and right occipital lobe. Electronically Signed   By: Romona Curls M.D.   On: 04/24/2023 17:48   DG Chest 2 View Result Date: 04/24/2023 CLINICAL DATA:  Weakness. EXAM: CHEST - 2 VIEW COMPARISON:  07/12/2016. FINDINGS: Cardiac silhouette borderline enlarged. Left coronary artery stent. No mediastinal or hilar masses. No convincing adenopathy. Moderate right and small left pleural  effusions. Additional opacity at the right lung base which may reflect atelectasis or pneumonia. Vascular congestion and bilateral interstitial thickening. Skeletal structures are grossly intact. IMPRESSION: 1. Findings consistent with congestive heart failure. Moderate right and small left pleural effusions. 2. Right lung base opacity may reflect atelectasis or pneumonia. Electronically Signed   By: Amie Portland M.D.   On: 04/24/2023 16:11    ECHO as above  TELEMETRY reviewed by me 04/28/2023: Sinus rhythm PVCs rate 90s  EKG reviewed by me: Sinus rhythm rate 88 bpm  Data reviewed by me 04/28/2023: last 24h vitals tele labs imaging I/O hospitalist progress note, neurology notes, advanced heart failure notes  Principal Problem:   Acute stroke due to ischemia [facility]) Active Problems:   Benign prostatic hyperplasia   Benign essential hypertension   Tobacco use disorder   Coronary artery disease involving native coronary artery of native heart with angina pectoris (HCC)   Acute respiratory failure with hypoxia (HCC)   CKD stage 3a, GFR 45-59 ml/min (HCC)   Severe sepsis (HCC)   Pleural effusion on right   Mass of right lung   LV (left ventricular) mural thrombus   Acute on chronic systolic CHF (congestive heart failure) (HCC)   AAA (abdominal aortic aneurysm) (HCC)   Involuntary commitment    ASSESSMENT AND PLAN:  Laverta Baltimore  is a 68 y.o. male  with a past medical history of hypertension, hyperlipidemia, tobacco use, BPH who presented to the ED on 04/24/2023 for generalized weakness. Cardiology was consulted for further evaluation.   # Acute CVA # LV apical thrombus # Acute systolic heart failure # Ischemic cardiomyopathy Patient initially presented for generalized weakness found to have LV apical thrombus on CTA chest.  Also with significant lower extremity edema and dyspnea on exertion for the last few weeks.  BNP elevated greater than 4500.  MRI head today with 2 acute infarcts.   Echo read this morning demonstrates EF less than 20% with global hypokinesis. cMRI this admission with subendocardial LGE LV basal-mid inferior wall and LV mid-apical anterior, anteroseptal, and apical walls. He is without anginal symptoms.  -Continue IV Lasix 40 mg twice daily. Likely transition to po tomorrow.  -Continue spironolactone 25 mg daily, losartan 50 mg daily.  Consider further additions to GDMT pending BP and renal function. -Advanced heart failure following.  -Continue to monitor renal function closely with diuresis. -Given multiple comorbidities, patient would not be a candidate for advanced therapies as per advanced heart failure team. Given this will defer LifeVest. ] -Not a candidate for LHC at this time for ischemic evaluation due to acute CVA.  -Continue eliquis, aspirin 81 mg daily, and atorvastatin 80 mg daily.   # RLL lung mass # Large R pleural effusion # Pulmonary embolus Patient with right lower lobe lung mass noted on CTA chest as well as large right pleural effusion.  Concern for malignancy. -S/p thoracentesis 04/25/2023 with 1 L yielded -Further management per primary team. -Continue eliquis.    This patient's plan of care was discussed and created with Dr. Melton Alar and she is in agreement.  Signed: Gale Journey, PA-C  04/28/2023, 9:34 AM [facility] Cardiology

## 2023-04-29 DIAGNOSIS — I639 Cerebral infarction, unspecified: Secondary | ICD-10-CM | POA: Diagnosis not present

## 2023-04-29 DIAGNOSIS — I513 Intracardiac thrombosis, not elsewhere classified: Secondary | ICD-10-CM | POA: Diagnosis not present

## 2023-04-29 DIAGNOSIS — R918 Other nonspecific abnormal finding of lung field: Secondary | ICD-10-CM | POA: Diagnosis not present

## 2023-04-29 DIAGNOSIS — I5023 Acute on chronic systolic (congestive) heart failure: Secondary | ICD-10-CM | POA: Diagnosis not present

## 2023-04-29 LAB — BASIC METABOLIC PANEL
Anion gap: 11 (ref 5–15)
BUN: 24 mg/dL — ABNORMAL HIGH (ref 8–23)
CO2: 29 mmol/L (ref 22–32)
Calcium: 8.8 mg/dL — ABNORMAL LOW (ref 8.9–10.3)
Chloride: 90 mmol/L — ABNORMAL LOW (ref 98–111)
Creatinine, Ser: 1.25 mg/dL — ABNORMAL HIGH (ref 0.61–1.24)
GFR, Estimated: >60 mL/min (ref >60)
Glucose, Bld: 96 mg/dL (ref 70–99)
Potassium: 3.9 mmol/L (ref 3.5–5.1)
Sodium: 130 mmol/L — ABNORMAL LOW (ref 135–145)

## 2023-04-29 LAB — BODY FLUID CULTURE W GRAM STAIN: Culture: NO GROWTH

## 2023-04-29 LAB — CULTURE, BLOOD (ROUTINE X 2)
Culture: NO GROWTH
Culture: NO GROWTH
Special Requests: ADEQUATE
Special Requests: ADEQUATE

## 2023-04-29 LAB — IMMUNOFIXATION, URINE

## 2023-04-29 NOTE — Plan of Care (Signed)
  Problem: Education: Goal: Knowledge of disease or condition will improve Outcome: Progressing   Problem: Ischemic Stroke/TIA Tissue Perfusion: Goal: Complications of ischemic stroke/TIA will be minimized Outcome: Progressing   Problem: Coping: Goal: Will verbalize positive feelings about self Outcome: Progressing   Problem: Self-Care: Goal: Ability to participate in self-care as condition permits will improve Outcome: Progressing   Problem: Nutrition: Goal: Risk of aspiration will decrease Outcome: Progressing   Problem: Activity: Goal: Risk for activity intolerance will decrease Outcome: Progressing   Problem: Nutrition: Goal: Adequate nutrition will be maintained Outcome: Progressing   Problem: Pain Managment: Goal: General experience of comfort will improve and/or be controlled Outcome: Progressing   Problem: Safety: Goal: Ability to remain free from injury will improve Outcome: Progressing   Problem: Skin Integrity: Goal: Risk for impaired skin integrity will decrease Outcome: Progressing

## 2023-04-29 NOTE — Progress Notes (Signed)
Advanced Heart Failure Rounding Note  Cardiologist: [facility]  Chief Complaint: Acute on chronic biventricular systolic CHF  Subjective:    Sleeping well, no complaints, sitter still at bedside.    Objective:   Weight Range: 57.1 kg Body mass index is 20.95 kg/m.   Vital Signs:   Temp:  [97.4 F (36.3 C)-98.8 F (37.1 C)] 97.8 F (36.6 C) (01/24 1129) Pulse Rate:  [71-93] 73 (01/24 1129) Resp:  [16-19] 18 (01/24 1129) BP: (99-159)/(64-99) 122/77 (01/24 1129) SpO2:  [96 %-100 %] 97 % (01/24 1129) Weight:  [57.1 kg] 57.1 kg (01/24 0405) Last BM Date :  (PTA)  Weight change: Filed Weights   04/26/23 0500 04/27/23 0347 04/29/23 0405  Weight: 73 kg 64 kg 57.1 kg    Intake/Output:   Intake/Output Summary (Last 24 hours) at 04/29/2023 1241 Last data filed at 04/29/2023 1000 Gross per 24 hour  Intake 600 ml  Output --  Net 600 ml      Physical Exam    General:  Frail. Chronically ill appearing HEENT: normal Neck: supple. no JVD.  Cor:  Regular rate & rhythm. No rubs, gallops or murmurs. Lungs: no respiratory distress Abdomen: soft, nontender, nondistended.  Extremities: no cyanosis, clubbing, rash, edema Neuro: Affect pleasant  Telemetry   SR 60s  Labs    CBC Recent Labs    04/27/23 0924 04/28/23 0424  WBC 5.2 5.3  HGB 14.6 14.6  HCT 45.4 44.3  MCV 79.1* 76.8*  PLT 90* 99*   Basic Metabolic Panel Recent Labs    13/24/40 0924 04/28/23 0424 04/29/23 0551  NA 132* 129* 130*  K 3.3* 4.0 3.9  CL 94* 91* 90*  CO2 29 28 29   GLUCOSE 82 91 96  BUN 28* 27* 24*  CREATININE 1.44* 1.33* 1.25*  CALCIUM 8.4* 8.3* 8.8*  MG 1.9 2.3  --    Liver Function Tests No results for input(s): "AST", "ALT", "ALKPHOS", "BILITOT", "PROT", "ALBUMIN" in the last 72 hours.  No results for input(s): "LIPASE", "AMYLASE" in the last 72 hours. Cardiac Enzymes No results for input(s): "CKTOTAL", "CKMB", "CKMBINDEX", "TROPONINI" in the last 72  hours.  BNP: BNP (last 3 results) Recent Labs    04/24/23 1524  BNP >4,500.0*    ProBNP (last 3 results) No results for input(s): "PROBNP" in the last 8760 hours.   D-Dimer No results for input(s): "DDIMER" in the last 72 hours. Hemoglobin A1C No results for input(s): "HGBA1C" in the last 72 hours.  Fasting Lipid Panel No results for input(s): "CHOL", "HDL", "LDLCALC", "TRIG", "CHOLHDL", "LDLDIRECT" in the last 72 hours.  Thyroid Function Tests No results for input(s): "TSH", "T4TOTAL", "T3FREE", "THYROIDAB" in the last 72 hours.  Invalid input(s): "FREET3"   Other results:   Imaging    No results found.    Medications:     Scheduled Medications:  amoxicillin-clavulanate  1 tablet Oral Q12H   apixaban  10 mg Oral BID   Followed by   Melene Muller ON 05/02/2023] apixaban  5 mg Oral BID   atorvastatin  80 mg Oral QPM   azithromycin  250 mg Oral Daily   feeding supplement  237 mL Oral BID BM   losartan  50 mg Oral Daily   metoprolol succinate  25 mg Oral Daily   nicotine  21 mg Transdermal Daily   sodium chloride flush  3 mL Intravenous Q12H   spironolactone  25 mg Oral Daily   tamsulosin  0.4 mg Oral Daily  torsemide  20 mg Oral Daily    Infusions:    PRN Medications: acetaminophen **OR** acetaminophen, morphine injection, QUEtiapine    Patient Profile   68 y.o. male with history of CAD s/p anterior MI and stent to LAD in 2006, longstanding tobacco use. Admitted with acute respiratory failure 2/2 large R pleural effusion w/ ? RLL lung mass and acute systolic CHF.    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 Known CTO of RCA with collaterals. -EF 45% in 2013 with WMA in LAD territory -Echo this admit: LVEF < 20%, RV severely reduced, thick RV and LV, RVSP 50 mmHg, moderate MR, moderate TR.  -cMRI 01/22: LVEF 23%, mildly reduced RV, cardiomyopathy appears ischemic with prior  infarcts in LAD (appeared viable) and RCA (non viable) territories, 20% LGE/scar burden -Not currently candidate for cardiac cath with LV thrombus and CVA, poor bypass candidate -Continue torsemide 20mg  daily -Continue Losartan 50 mg daily -Continue spiro 25 mg daily -Continue metoprolol succinate 25 mg daily -Hesitant to use SGLT2i with hygiene and history of urinary retention. Med would also be very expensive with his insurance. -He is a candidate for advanced therapies -Given his likely malignancy, poor revascularization options due not believe that patient would benefit from [facility]   2. CAD -Hx anterior MI in 2006 s/p PCI to LAD -He's had known CTO to RCA with collaterals. This was unchanged on last cath in 2017. Also had 70% d LAD (treated medically) and patent previously placed stent. -Not a candidate for cath as above -Not currently on antiplatelet d/t need for anticoagulation. Continue 80 mg atorvastatin daily.   3. LV apical thrombus -Noted on CT, not noted on MRI -On eliquis.    4. RLL lung mass Large R pleural effusion -RLL mass measuring 3.6 cm -S/p thoracentesis which yielded 1.9L fluid -Cytology negative, will get outpatient oncology follow up   5. CVA -MRI brain 2 punctate infarcts left putamen -Neurology following -Suspect cardioembolic in setting of LV thrombus  6. Possible PE -Unable to exclude. CTA difficult d/t large pleural effusion. -Continue Eliquis   7. AAA -5.9 cm AAA incidentally noted on imaging -Not stable enough for an intervention   8. CKD IIIa -Scr stable at 1.3 -Follow with diuretics   8. Tobacco use -Has smoked for many years -Encouraged cessation  Patient is stable for discharge from a cardiovascular standpoint on current oral medication regimen.   Length of Stay: 5  Romie Minus, MD  04/29/2023, 12:41 PM  Advanced Heart Failure Team Pager [phone] (M-F; 7a - 5p)  Please contact [facility] for night-coverage after  hours (5p -7a ) and weekends on [email or web address]

## 2023-04-29 NOTE — Progress Notes (Signed)
[facility] CLINIC CARDIOLOGY PROGRESS NOTE       Patient ID: Dennis Church MRN: [id number] DOB/AGE: 05-17-1955 68 y.o.  Admit date: 04/24/2023 Referring Physician Dr. Irena Cords Primary Physician Patient, No Pcp Per  Primary Cardiologist Dr. Lady Gary (last seen 2021) Reason for Consultation LV thrombus  HPI: Dennis Church is a 68 y.o. male  with a past medical history of hypertension, hyperlipidemia, tobacco use, BPH who presented to the ED on 04/24/2023 for generalized weakness. Cardiology was consulted for further evaluation.   Interval history: -Patient seen and examined this morning, resting comfortably in hospital bed with sitter present at bedside.  -BP and heart rate remain stable. He is without complaints of chest pain, shortness of breath.  -Net negative over 8L, weight down over 10 kg since admission. Renal function stable and transitioned to po diuretics yesterday.   Review of systems complete and found to be negative unless listed above    Past Medical History:  Diagnosis Date   Anginal pain (HCC)    Coronary artery disease    Hyperlipidemia    Hypertension    Myocardial infarction [facility])    Renal disorder    kidney stones    Past Surgical History:  Procedure Laterality Date   CARDIAC CATHETERIZATION Left 04/25/2015   Procedure: Left Heart Cath and Coronary Angiography;  Surgeon: Dalia Heading, MD;  Location: [facility] INVASIVE CV LAB;  Service: Cardiovascular;  Laterality: Left;   CAROTID ENDARTERECTOMY Left    CORONARY ANGIOPLASTY WITH STENT PLACEMENT     LITHOTRIPSY      Medications Prior to Admission  Medication Sig Dispense Refill Last Dose/Taking   amLODipine (NORVASC) 5 MG tablet Take 5 mg by mouth daily.   04/24/2023 Morning   aspirin EC 81 MG tablet Take 81 mg by mouth daily.   04/24/2023 Morning   atorvastatin (LIPITOR) 80 MG tablet Take 80 mg by mouth every evening.   04/23/2023   clopidogrel (PLAVIX) 75 MG tablet Take 75 mg by mouth daily.   04/24/2023 Morning    isosorbide mononitrate (IMDUR) 60 MG 24 hr tablet Take 60 mg by mouth daily.   04/24/2023 Morning   metoprolol tartrate (LOPRESSOR) 25 MG tablet Take 25 mg by mouth 2 (two) times daily.   04/24/2023 Morning   nitroGLYCERIN (NITROSTAT) 0.4 MG SL tablet Place 0.4 mg under the tongue every 5 (five) minutes x 3 doses as needed.   Taking As Needed   tamsulosin (FLOMAX) 0.4 MG CAPS capsule Take 1 capsule (0.4 mg total) by mouth daily. 30 capsule 1 04/24/2023 Morning   Social History   Socioeconomic History   Marital status: Single    Spouse name: Not on file   Number of children: Not on file   Years of education: Not on file   Highest education level: Not on file  Occupational History   Not on file  Tobacco Use   Smoking status: Former   Smokeless tobacco: Never  Vaping Use   Vaping status: Never Used  Substance and Sexual Activity   Alcohol use: No   Drug use: Not Currently   Sexual activity: Not on file  Other Topics Concern   Not on file  Social History Narrative   Not on file   Social Drivers of Health   Financial Resource Strain: Not on file  Food Insecurity: No Food Insecurity (04/25/2023)   Hunger Vital Sign    Worried About Running Out of Food in the Last Year: Never true  Ran Out of Food in the Last Year: Never true  Transportation Needs: No Transportation Needs (04/25/2023)   PRAPARE - Administrator, Civil Service (Medical): No    Lack of Transportation (Non-Medical): No  Physical Activity: Not on file  Stress: Not on file  Social Connections: Socially Isolated (04/25/2023)   Social Connection and Isolation Panel [NHANES]    Frequency of Communication with Friends and Family: Once a week    Frequency of Social Gatherings with Friends and Family: Once a week    Attends Religious Services: Never    Database administrator or Organizations: No    Attends Banker Meetings: Never    Marital Status: Divorced  Catering manager Violence: Not At Risk  (04/25/2023)   Humiliation, Afraid, Rape, and Kick questionnaire    Fear of Current or Ex-Partner: No    Emotionally Abused: No    Physically Abused: No    Sexually Abused: No    History reviewed. No pertinent family history.   Vitals:   04/29/23 0005 04/29/23 0324 04/29/23 0405 04/29/23 0748  BP: (!) 159/99 129/77  99/64  Pulse: 84 71  84  Resp:  18  16  Temp: 98.7 F (37.1 C) (!) 97.4 F (36.3 C)    TempSrc: Oral     SpO2: 96% 99%  99%  Weight:   57.1 kg   Height:        PHYSICAL EXAM General: Ill-appearing male, well nourished, in no acute distress resting comfortably in hospital bed. HEENT: Normocephalic and atraumatic. Neck: No JVD.  Lungs: Normal respiratory effort on 3L [place]. Clear bilaterally to auscultation. No wheezes, crackles, rhonchi.  Heart: HRRR. Normal S1 and S2 without gallops or murmurs.  Abdomen: Non-distended appearing.  Msk: Normal strength and tone for age. Extremities: Warm and well perfused. No clubbing, cyanosis.  No edema.  Neuro: Alert and oriented X 3. Psych: Answers questions appropriately.   Labs: Basic Metabolic Panel: Recent Labs    04/27/23 0924 04/28/23 0424 04/29/23 0551  NA 132* 129* 130*  K 3.3* 4.0 3.9  CL 94* 91* 90*  CO2 29 28 29   GLUCOSE 82 91 96  BUN 28* 27* 24*  CREATININE 1.44* 1.33* 1.25*  CALCIUM 8.4* 8.3* 8.8*  MG 1.9 2.3  --    Liver Function Tests: No results for input(s): "AST", "ALT", "ALKPHOS", "BILITOT", "PROT", "ALBUMIN" in the last 72 hours.  No results for input(s): "LIPASE", "AMYLASE" in the last 72 hours. CBC: Recent Labs    04/27/23 0924 04/28/23 0424  WBC 5.2 5.3  HGB 14.6 14.6  HCT 45.4 44.3  MCV 79.1* 76.8*  PLT 90* 99*   Cardiac Enzymes: No results for input(s): "CKTOTAL", "CKMB", "CKMBINDEX", "TROPONINIHS" in the last 72 hours.  BNP: No results for input(s): "BNP" in the last 72 hours.  D-Dimer: No results for input(s): "DDIMER" in the last 72 hours. Hemoglobin A1C: No results for  input(s): "HGBA1C" in the last 72 hours.  Fasting Lipid Panel: No results for input(s): "CHOL", "HDL", "LDLCALC", "TRIG", "CHOLHDL", "LDLDIRECT" in the last 72 hours.  Thyroid Function Tests: No results for input(s): "TSH", "T4TOTAL", "T3FREE", "THYROIDAB" in the last 72 hours.  Invalid input(s): "FREET3"  Anemia Panel: No results for input(s): "VITAMINB12", "FOLATE", "FERRITIN", "TIBC", "IRON", "RETICCTPCT" in the last 72 hours.   Radiology: MR CARDIAC MORPHOLOGY W WO CONTRAST Result Date: 04/27/2023 CLINICAL DATA:  Cardiomyopathy, LV thrombus EXAM: CARDIAC MRI TECHNIQUE: The patient was scanned on a 1.5 Tesla  Siemens magnet. A dedicated cardiac coil was used. Functional imaging was done using Fiesta sequences. 2,3, and 4 chamber views were done to assess for RWMA's. Modified Simpson's rule using a short axis stack was used to calculate an ejection fraction on a dedicated work Research officer, trade union. The patient received 10 cc of Gadavist. After 10 minutes inversion recovery sequences were used to assess for infiltration and scar tissue. Velocity flow mapping performed in the ascending aorta and main pulmonary artery. CONTRAST:  10 cc  of Gadavist FINDINGS: 1. Normal left ventricular size, mild LV thickness and systolic function (LVEF =23%). There is global hypokinesis. There is near transmural (75%) subendocardial late gadolinium enhancement in the left ventricular myocardial basal-mid inferior wall. There is subendocardial LGE (50%) in the mid-apical LV segments involving the anterior, anteroseptal and apical walls. Total LV LGE scar 34g, approximately 20% of total LV myocardial mass. LVEDV: 194 ml LVESV: 150 ml SV: 44 ml CO: 3.2 L/min Myocardial mass: 167 g 2. Normal right ventricular size and thickness. Mildly reduced LV systolic function (RVEF =37%). There are no regional wall motion abnormalities. 3.  Mildly dilated left and right atrium. 4. Normal size of the aortic root, ascending aorta  and pulmonary artery. 5.  Mild mitral and tricuspid regurgitation. 6.  Normal pericardium.  No pericardial effusion. IMPRESSION: 1.  Severely reduced LV systolic function.  LVEF 23%. 2. Near transmural (75%) subendocardial LGE in the LV basal-mid inferior wall (non viable). 3. Subendocardial LGE (50%) in the mid-apical LV segments involving the anterior, anteroseptal and apical walls (appear viable). 4. Total LV LGE/scar is 34g, approximately 20% of total LV myocardial mass. 5.  Mildly reduced RV function. 6. 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 Electronically Signed   By: Debbe Odea M.D.   On: 04/27/2023 13:40   MR CARDIAC VELOCITY FLOW MAP Result Date: 04/27/2023 CLINICAL DATA:  Cardiomyopathy, LV thrombus EXAM: CARDIAC MRI TECHNIQUE: The patient was scanned on a 1.5 Tesla Siemens magnet. A dedicated cardiac coil was used. Functional imaging was done using Fiesta sequences. 2,3, and 4 chamber views were done to assess for RWMA's. Modified Simpson's rule using a short axis stack was used to calculate an ejection fraction on a dedicated work Research officer, trade union. The patient received 10 cc of Gadavist. After 10 minutes inversion recovery sequences were used to assess for infiltration and scar tissue. Velocity flow mapping performed in the ascending aorta and main pulmonary artery. CONTRAST:  10 cc  of Gadavist FINDINGS: 1. Normal left ventricular size, mild LV thickness and systolic function (LVEF =23%). There is global hypokinesis. There is near transmural (75%) subendocardial late gadolinium enhancement in the left ventricular myocardial basal-mid inferior wall. There is subendocardial LGE (50%) in the mid-apical LV segments involving the anterior, anteroseptal and apical walls. Total LV LGE scar 34g, approximately 20% of total LV myocardial mass. LVEDV: 194 ml LVESV: 150 ml SV: 44 ml CO: 3.2 L/min Myocardial mass: 167 g 2.  Normal right ventricular size and thickness. Mildly reduced LV systolic function (RVEF =37%). There are no regional wall motion abnormalities. 3.  Mildly dilated left and right atrium. 4. Normal size of the aortic root, ascending aorta and pulmonary artery. 5.  Mild mitral and tricuspid regurgitation. 6.  Normal pericardium.  No pericardial effusion. IMPRESSION: 1.  Severely reduced LV systolic function.  LVEF 23%. 2. Near transmural (75%) subendocardial LGE in the LV basal-mid inferior wall (non viable). 3. Subendocardial  LGE (50%) in the mid-apical LV segments involving the anterior, anteroseptal and apical walls (appear viable). 4. Total LV LGE/scar is 34g, approximately 20% of total LV myocardial mass. 5.  Mildly reduced RV function. 6. 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 Electronically Signed   By: Debbe Odea M.D.   On: 04/27/2023 13:40   MR CARDIAC VELOCITY FLOW MAP Result Date: 04/27/2023 CLINICAL DATA:  Cardiomyopathy, LV thrombus EXAM: CARDIAC MRI TECHNIQUE: The patient was scanned on a 1.5 Tesla Siemens magnet. A dedicated cardiac coil was used. Functional imaging was done using Fiesta sequences. 2,3, and 4 chamber views were done to assess for RWMA's. Modified Simpson's rule using a short axis stack was used to calculate an ejection fraction on a dedicated work Research officer, trade union. The patient received 10 cc of Gadavist. After 10 minutes inversion recovery sequences were used to assess for infiltration and scar tissue. Velocity flow mapping performed in the ascending aorta and main pulmonary artery. CONTRAST:  10 cc  of Gadavist FINDINGS: 1. Normal left ventricular size, mild LV thickness and systolic function (LVEF =23%). There is global hypokinesis. There is near transmural (75%) subendocardial late gadolinium enhancement in the left ventricular myocardial basal-mid inferior wall. There is subendocardial LGE (50%)  in the mid-apical LV segments involving the anterior, anteroseptal and apical walls. Total LV LGE scar 34g, approximately 20% of total LV myocardial mass. LVEDV: 194 ml LVESV: 150 ml SV: 44 ml CO: 3.2 L/min Myocardial mass: 167 g 2. Normal right ventricular size and thickness. Mildly reduced LV systolic function (RVEF =37%). There are no regional wall motion abnormalities. 3.  Mildly dilated left and right atrium. 4. Normal size of the aortic root, ascending aorta and pulmonary artery. 5.  Mild mitral and tricuspid regurgitation. 6.  Normal pericardium.  No pericardial effusion. IMPRESSION: 1.  Severely reduced LV systolic function.  LVEF 23%. 2. Near transmural (75%) subendocardial LGE in the LV basal-mid inferior wall (non viable). 3. Subendocardial LGE (50%) in the mid-apical LV segments involving the anterior, anteroseptal and apical walls (appear viable). 4. Total LV LGE/scar is 34g, approximately 20% of total LV myocardial mass. 5.  Mildly reduced RV function. 6. 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 Electronically Signed   By: Debbe Odea M.D.   On: 04/27/2023 13:40   ECHOCARDIOGRAM COMPLETE Result Date: 04/26/2023    ECHOCARDIOGRAM REPORT   Patient Name:   Dennis Church Scherzer Date of Exam: 04/25/2023 Medical Rec #:  [id number]     Height:       65.0 in Accession #:    [id number]    Weight:       157.0 lb Date of Birth:  27-Sep-1955     BSA:          1.785 m Patient Age:    67 years      BP:           117/105 mmHg Patient Gender: M             HR:           81 bpm. Exam Location:  [facility] Procedure: 2D Echo, Cardiac Doppler, Color Doppler and Intracardiac            Opacification Agent Indications:     Other abnormalities of the heart  History:         Patient has no prior history of Echocardiogram examinations.  CAD, Stroke; Risk Factors:Hypertension, Dyslipidemia and                  Current Smoker. CKD, Mural thrombus.   Sonographer:     Mikki Harbor Referring Phys:  [id number] Alizae Bechtel Diagnosing Phys: Rozell Searing Custovic IMPRESSIONS  1. Left ventricular ejection fraction, by estimation, is <20%. The left ventricle has severely decreased function. The left ventricle demonstrates global hypokinesis. The left ventricular internal cavity size was moderately dilated. Left ventricular diastolic parameters are consistent with Grade I diastolic dysfunction (impaired relaxation).  2. Severe RV volume and pressure overload. Right ventricular systolic function is severely reduced. The right ventricular size is moderately enlarged. There is moderately elevated pulmonary artery systolic pressure. The estimated right ventricular systolic pressure is 50.0 mmHg.  3. Left atrial size was severely dilated.  4. Right atrial size was severely dilated.  5. The mitral valve is grossly normal. Moderate mitral valve regurgitation.  6. Tricuspid valve regurgitation is moderate.  7. The aortic valve is grossly normal. Aortic valve regurgitation is trivial. Aortic valve sclerosis/calcification is present, without any evidence of aortic stenosis. FINDINGS  Left Ventricle: Left ventricular ejection fraction, by estimation, is <20%. The left ventricle has severely decreased function. The left ventricle demonstrates global hypokinesis. Definity contrast agent was given IV to delineate the left ventricular endocardial borders. The left ventricular internal cavity size was moderately dilated. There is borderline left ventricular hypertrophy. Left ventricular diastolic parameters are consistent with Grade I diastolic dysfunction (impaired relaxation). Right Ventricle: Severe RV volume and pressure overload. The right ventricular size is moderately enlarged. No increase in right ventricular wall thickness. Right ventricular systolic function is severely reduced. There is moderately elevated pulmonary artery systolic pressure. The tricuspid regurgitant velocity  is 2.96 m/s, and with an assumed right atrial pressure of 15 mmHg, the estimated right ventricular systolic pressure is 50.0 mmHg. Left Atrium: Left atrial size was severely dilated. Right Atrium: Right atrial size was severely dilated. Pericardium: There is no evidence of pericardial effusion. Mitral Valve: The mitral valve is grossly normal. Moderate mitral valve regurgitation. MV peak gradient, 2.3 mmHg. The mean mitral valve gradient is 1.0 mmHg. Tricuspid Valve: The tricuspid valve is grossly normal. Tricuspid valve regurgitation is moderate. Aortic Valve: The aortic valve is grossly normal. Aortic valve regurgitation is trivial. Aortic valve sclerosis/calcification is present, without any evidence of aortic stenosis. Aortic valve mean gradient measures 8.0 mmHg. Aortic valve peak gradient measures 14.3 mmHg. Aortic valve area, by VTI measures 0.95 cm. Pulmonic Valve: The pulmonic valve was grossly normal. Pulmonic valve regurgitation is not visualized. Aorta: The aortic root is normal in size and structure. IAS/Shunts: No atrial level shunt detected by color flow Doppler.  LEFT VENTRICLE PLAX 2D LVIDd:         4.20 cm LVIDs:         3.20 cm LV PW:         1.30 cm LV IVS:        1.30 cm LVOT diam:     1.90 cm LV SV:         35 LV SV Index:   20 LVOT Area:     2.84 cm  RIGHT VENTRICLE RV Basal diam:  4.45 cm RV Mid diam:    4.30 cm RV S prime:     6.66 cm/s LEFT ATRIUM             Index        RIGHT ATRIUM  Index LA diam:        4.40 cm 2.47 cm/m   RA Area:     22.10 cm LA Vol (A2C):   88.9 ml 49.81 ml/m  RA Volume:   79.50 ml  44.54 ml/m LA Vol (A4C):   79.3 ml 44.43 ml/m LA Biplane Vol: 85.7 ml 48.02 ml/m  AORTIC VALVE                     PULMONIC VALVE AV Area (Vmax):    1.00 cm      PV Vmax:       0.92 m/s AV Area (Vmean):   0.95 cm      PV Peak grad:  3.4 mmHg AV Area (VTI):     0.95 cm AV Vmax:           189.00 cm/s AV Vmean:          127.000 cm/s AV VTI:            0.369 m AV Peak  Grad:      14.3 mmHg AV Mean Grad:      8.0 mmHg LVOT Vmax:         66.60 cm/s LVOT Vmean:        42.500 cm/s LVOT VTI:          0.124 m LVOT/AV VTI ratio: 0.34  AORTA Ao Root diam: 3.10 cm MITRAL VALVE               TRICUSPID VALVE MV Area (PHT): 3.10 cm    TR Peak grad:   35.0 mmHg MV Area VTI:   1.44 cm    TR Vmax:        296.00 cm/s MV Peak grad:  2.3 mmHg MV Mean grad:  1.0 mmHg    SHUNTS MV Vmax:       0.76 m/s    Systemic VTI:  0.12 m MV Vmean:      37.1 cm/s   Systemic Diam: 1.90 cm MV Decel Time: 245 msec MV E velocity: 73.70 cm/s MV A velocity: 64.30 cm/s MV E/A ratio:  1.15 Designer, multimedia signed by Clotilde Dieter Signature Date/Time: 04/26/2023/9:17:35 AM    Final    CT ANGIO HEAD NECK W WO CM Result Date: 04/25/2023 CLINICAL DATA:  Neuro deficit, acute, stroke suspected EXAM: CT ANGIOGRAPHY HEAD AND NECK WITH AND WITHOUT CONTRAST TECHNIQUE: Multidetector CT imaging of the head and neck was performed using the standard protocol during bolus administration of intravenous contrast. Multiplanar CT image reconstructions and MIPs were obtained to evaluate the vascular anatomy. Carotid stenosis measurements (when applicable) are obtained utilizing NASCET criteria, using the distal internal carotid diameter as the denominator. RADIATION DOSE REDUCTION: This exam was performed according to the departmental dose-optimization program which includes automated exposure control, adjustment of the mA and/or kV according to patient size and/or use of iterative reconstruction technique. CONTRAST:  75mL OMNIPAQUE IOHEXOL 350 MG/ML SOLN COMPARISON:  Same day brain MR FINDINGS: CT HEAD FINDINGS Brain: No hemorrhage. No hydrocephalus. No extra-axial fluid collection. No mass effect. No mass lesion. 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 Generalized volume loss without lobar predominance. Vascular: No hyperdense vessel or unexpected calcification. Skull: Normal. Negative for fracture or focal  lesion. Sinuses/Orbits: No middle ear or mastoid effusion. Paranasal sinuses are clear. Bilateral lens replacement. Orbits are otherwise unremarkable. Other: None. Review of the MIP images confirms the above findings CTA NECK FINDINGS Aortic arch: Standard branching. Imaged  portion shows no evidence of aneurysm or dissection. Mild-to-moderate narrowing in the right subclavian artery Right carotid system: Moderate narrowing (approximately 50%) in the distal cervical ICA on the right (series 12, image 103). Left carotid system: No evidence of dissection, stenosis (50% or greater), or occlusion. Mild narrowing in the distal cervical ICA on the left. 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 Moderate to severe focal narrowing in the V1 segment of the left vertebral artery which arises from the aortic arch. Moderate narrowing in the V2 segment of the left vertebral artery. Skeleton: Negative. Other neck: Negative. Upper chest: Centrilobular and paraseptal emphysema. Small left pleural effusion. 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 Moderate stenosis in the cavernous segment of the left ICA (series 14, image 83). Moderate narrowing in the distal A2 segment of the right ACA (series 10, image 54). 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 No aneurysm. No vascular malformation. Venous sinuses: As permitted by contrast timing, patent. Anatomic variants: None Review of the MIP images confirms the above findings IMPRESSION: 1. No hemorrhage or CT evidence of an acute cortical infarct. Infarcts seen on same day brain MRI are not visualized on this exam due to CT technique. 2. No emergent large vessel occlusion. 3. 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 There is sever  stenosis in the V1 segment of the left vertebral artery. 4. Moderate narrowing (approximately 50%) in the distal cervical ICA on the right. 5. Moderate to severe narrowing in the cavernous segments of bilateral ICAs. Emphysema (ICD10-J43.9). Electronically Signed   By: Lorenza Cambridge M.D.   On: 04/25/2023 17:52   US THORACENTESIS ASP PLEURAL SPACE W/IMG GUIDE Result Date: 04/25/2023 INDICATION: Patient is a 68 y/o male with history of CAD, HLD, HTN, MI, BPH and tobacco abuse. Patient presents for a diagnostic and therapeutic thoracentesis due to a right pleural effusion. EXAM: ULTRASOUND GUIDED RIGHT THORACENTESIS MEDICATIONS: 12mL of lidocaine 1% COMPLICATIONS: None immediate. PROCEDURE: An ultrasound guided thoracentesis was thoroughly discussed with the patient and questions answered. The benefits, risks, alternatives and complications were also discussed. The patient understands and wishes to proceed with the procedure. Written consent was obtained. Ultrasound was performed to localize and mark an adequate pocket of fluid in the right chest. The area was then prepped and draped in the normal sterile fashion. 1% Lidocaine was used for local anesthesia. Under ultrasound guidance a 6 Fr Safe-T-Centesis catheter was introduced. Thoracentesis was performed. The catheter was removed and a dressing applied. FINDINGS: A total of approximately 1.9L of clear, yellow fluid was removed. Samples were sent to the laboratory as requested by the clinical team. IMPRESSION: Successful ultrasound guided right thoracentesis yielding 1.9L of pleural fluid. Performed by Philipp Ovens PA-C Electronically Signed   By: Malachy Moan M.D.   On: 04/25/2023 16:30   DG Chest Port 1 View Result Date: 04/25/2023 CLINICAL DATA:  68 year old male status post right thoracentesis EXAM: PORTABLE CHEST 1 VIEW COMPARISON:  Prior chest x-ray 04/24/2023 FINDINGS: No evidence of pneumothorax following right-sided thoracentesis. Significant  interval reduction right pleural effusion. Stable cardiomegaly and mild pulmonary edema. IMPRESSION: No evidence of pneumothorax or other complication following right-sided thoracentesis. Electronically Signed   By: Malachy Moan M.D.   On: 04/25/2023 16:19   MR BRAIN W WO CONTRAST Result Date: 04/25/2023  CLINICAL DATA:  CNS neoplasm staging EXAM: MRI HEAD WITHOUT AND WITH CONTRAST TECHNIQUE: Multiplanar, multiecho pulse sequences of the brain and surrounding structures were obtained without and with intravenous contrast. CONTRAST:  7.8mL GADAVIST GADOBUTROL 1 MMOL/ML IV SOLN COMPARISON:  Head CT from yesterday FINDINGS: Brain: 2 punctate areas of restricted diffusion at the left upper putamen. Chronic infarcts scattered along the cerebral cortex, especially left superior frontal and right occipital. Ischemic gliosis in the cerebral Castor matter with chronic lacunes in the bilateral thalamus. Ischemic gliosis is extensive in the cerebral Sylvia matter. Brain atrophy is present. There are a few chronic blood products in the deep cerebellum, brainstem, left periatrial Spizzirri matter, and left thalamus which are likely related to chronic small vessel disease. No abnormal enhancement Vascular: Absent right vertebral flow void just beyond the dura. Skull and upper cervical spine: Normal marrow signal Sinuses/Orbits: Negative IMPRESSION: 1. Two small acute infarcts in the left putamen. 2. 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 3. Slow or absent flow in the right vertebral artery, likely chronic in the absence of acute infarct in this distribution. 4. No evidence of metastatic disease. Electronically Signed   By: Tiburcio Pea M.D.   On: 04/25/2023 09:17   CT Angio Chest Pulmonary Embolism (PE) W or WO Contrast Result Date: 04/24/2023 CLINICAL DATA:  Weight loss, fatigue, weakness, sepsis EXAM: CT ANGIOGRAPHY CHEST CT ABDOMEN AND PELVIS WITH CONTRAST TECHNIQUE: Multidetector CT imaging of  the chest was performed using the standard protocol during bolus administration of intravenous contrast. Multiplanar CT image reconstructions and MIPs were obtained to evaluate the vascular anatomy. Multidetector CT imaging of the abdomen and pelvis was performed using the standard protocol during bolus administration of intravenous contrast. RADIATION DOSE REDUCTION: This exam was performed according to the departmental dose-optimization program which includes automated exposure control, adjustment of the mA and/or kV according to patient size and/or use of iterative reconstruction technique. CONTRAST:  75mL OMNIPAQUE IOHEXOL 350 MG/ML SOLN COMPARISON:  04/24/2023, 06/30/2020, 07/12/2016 FINDINGS: CTA CHEST FINDINGS Cardiovascular: There is technically adequate opacification of the main pulmonary artery. Mixing artifact is identified. There is decreased contrast enhancement of the right middle and right lower lobe pulmonary arteries, which could be due to pulmonary emboli versus decreased perfusion due to presumed underlying chronic consolidation. No other filling defects are identified. The heart is enlarged, with mild left ventricular dilatation. Mural thrombus is seen within the left ventricular apex, reference image 7 of series 9 on the abdominal CT exam. There is reflux of contrast into the hepatic veins, suggesting an element of right sided cardiac dysfunction. No pericardial effusion. Normal caliber of the thoracic aorta. Evaluation of the aortic lumen is limited due to timing of the contrast bolus. Atherosclerosis of the aorta and coronary vasculature. Mediastinum/Nodes: There are numerous subcentimeter mediastinal and hilar lymph nodes, without evidence of pathologic adenopathy. Thyroid, trachea, and esophagus are grossly normal. Lungs/Pleura: There is a large right pleural effusion, volume estimated in excess of 2 L. There is rounded masslike consolidation of the right lower lobe in the infrahilar region,  measuring up to 3.6 cm reference image 91/2. Neoplasm cannot be excluded. Peripheral consolidation and volume loss elsewhere within the right lower lobe consistent with atelectasis. Indeterminate 4 mm left upper lobe pulmonary nodule reference image 19/3. Trace left pleural effusion. Upper lobe predominant emphysema.  No pneumothorax. Musculoskeletal: No acute or destructive bony abnormalities. Reconstructed images demonstrate no additional findings. Review of the MIP images confirms the above findings. CT ABDOMEN  and PELVIS FINDINGS Hepatobiliary: No focal liver abnormality is seen. No gallstones, gallbladder wall thickening, or biliary dilatation. Pancreas: Unremarkable. No pancreatic ductal dilatation or surrounding inflammatory changes. Spleen: Normal in size without focal abnormality. Adrenals/Urinary Tract: Mild bilateral renal cortical atrophy and scattered areas of renal cortical scarring, right greater than left. No urinary tract calculi or obstructive uropathy. Bladder is moderately distended, with multiple bladder diverticula consistent with chronic bladder outlet obstruction. Nonobstructing 2.1 cm bladder calculus layers dependently. The adrenals are unremarkable. Stomach/Bowel: No bowel obstruction or ileus. Distal colonic diverticulosis without diverticulitis. No bowel wall thickening or inflammatory change. Vascular/Lymphatic: There is a 5.9 cm infrarenal abdominal aortic aneurysm, with no evidence of leak or rupture. Chronic calcification is seen within the ventral aspect of the thrombosed portion of the aneurysm. Diffuse aortic atherosclerosis. No pathologic adenopathy. Reproductive: Marked enlargement of the prostate measuring 5.8 x 5.6 cm. Other: Moderate ascites, most pronounced within the bilateral flanks. No free intraperitoneal gas. No abdominal wall hernia. Musculoskeletal: Diffuse body wall edema. No acute or destructive bony abnormalities. Reconstructed images demonstrate no additional  findings. Review of the MIP images confirms the above findings. IMPRESSION: Chest: 1. Decreased enhancement of the right middle and right lower lobe pulmonary arteries, without definitive filling defect identified. Differential diagnosis would include decreased perfusion due to chronic consolidation versus pulmonary embolus. 2. Rounded masslike consolidation in the right lower lobe infrahilar region measuring up to 3.6 cm. The appearance is concerning for neoplasm, though underlying infection cannot be excluded. Pulmonology follow-up recommended. 3. Large right pleural effusion volume estimated in excess of 2 L. 4. Volume loss and peripheral consolidation within the right lower lobe, consistent with atelectasis. It is unclear whether this is postobstructive collapse or sys compressive atelectasis from the right pleural effusion. 5. Cardiomegaly, with prominent left ventricular dilatation. Small mural thrombi at the left ventricular apex best seen on the delayed contrast series on the corresponding CT abdomen exam. Echocardiography recommended. 6. Reflux of contrast into the hepatic veins consistent with cardiac dysfunction. 7.  Aortic Atherosclerosis (ICD10-I70.0). Abdomen/pelvis: 1. 5.9 cm infrarenal abdominal aortic aneurysm. No evidence of aortic leak or rupture. Recommend follow-up CT or MR as appropriate in 6 months and referral to or continued care with vascular specialist. (Ref.: J Vasc Surg. 2018; 67:2-77 and J Am Coll Radiol 2013;10(10):789-794.) 2. Moderate ascites. 3. Enlarged prostate, with evidence of chronic bladder outlet obstruction. 4. Nonobstructing 2.1 cm bladder calculus. 5. Sigmoid diverticulosis without diverticulitis. 6.  Aortic Atherosclerosis (ICD10-I70.0). 7. Diffuse body wall edema. Critical Value/emergent results were called by telephone at the time of interpretation on 04/24/2023 at 550 pm to provider [facility] , who verbally acknowledged these results. Electronically Signed   By:  Sharlet Salina M.D.   On: 04/24/2023 18:02   CT ABDOMEN PELVIS W CONTRAST Result Date: 04/24/2023 CLINICAL DATA:  Weight loss, fatigue, weakness, sepsis EXAM: CT ANGIOGRAPHY CHEST CT ABDOMEN AND PELVIS WITH CONTRAST TECHNIQUE: Multidetector CT imaging of the chest was performed using the standard protocol during bolus administration of intravenous contrast. Multiplanar CT image reconstructions and MIPs were obtained to evaluate the vascular anatomy. Multidetector CT imaging of the abdomen and pelvis was performed using the standard protocol during bolus administration of intravenous contrast. RADIATION DOSE REDUCTION: This exam was performed according to the departmental dose-optimization program which includes automated exposure control, adjustment of the mA and/or kV according to patient size and/or use of iterative reconstruction technique. CONTRAST:  75mL OMNIPAQUE IOHEXOL 350 MG/ML SOLN COMPARISON:  04/24/2023, 06/30/2020, 07/12/2016 FINDINGS:  CTA CHEST FINDINGS Cardiovascular: There is technically adequate opacification of the main pulmonary artery. Mixing artifact is identified. There is decreased contrast enhancement of the right middle and right lower lobe pulmonary arteries, which could be due to pulmonary emboli versus decreased perfusion due to presumed underlying chronic consolidation. No other filling defects are identified. The heart is enlarged, with mild left ventricular dilatation. Mural thrombus is seen within the left ventricular apex, reference image 7 of series 9 on the abdominal CT exam. There is reflux of contrast into the hepatic veins, suggesting an element of right sided cardiac dysfunction. No pericardial effusion. Normal caliber of the thoracic aorta. Evaluation of the aortic lumen is limited due to timing of the contrast bolus. Atherosclerosis of the aorta and coronary vasculature. Mediastinum/Nodes: There are numerous subcentimeter mediastinal and hilar lymph nodes, without evidence  of pathologic adenopathy. Thyroid, trachea, and esophagus are grossly normal. Lungs/Pleura: There is a large right pleural effusion, volume estimated in excess of 2 L. There is rounded masslike consolidation of the right lower lobe in the infrahilar region, measuring up to 3.6 cm reference image 91/2. Neoplasm cannot be excluded. Peripheral consolidation and volume loss elsewhere within the right lower lobe consistent with atelectasis. Indeterminate 4 mm left upper lobe pulmonary nodule reference image 19/3. Trace left pleural effusion. Upper lobe predominant emphysema.  No pneumothorax. Musculoskeletal: No acute or destructive bony abnormalities. Reconstructed images demonstrate no additional findings. Review of the MIP images confirms the above findings. CT ABDOMEN and PELVIS FINDINGS Hepatobiliary: No focal liver abnormality is seen. No gallstones, gallbladder wall thickening, or biliary dilatation. Pancreas: Unremarkable. No pancreatic ductal dilatation or surrounding inflammatory changes. Spleen: Normal in size without focal abnormality. Adrenals/Urinary Tract: Mild bilateral renal cortical atrophy and scattered areas of renal cortical scarring, right greater than left. No urinary tract calculi or obstructive uropathy. Bladder is moderately distended, with multiple bladder diverticula consistent with chronic bladder outlet obstruction. Nonobstructing 2.1 cm bladder calculus layers dependently. The adrenals are unremarkable. Stomach/Bowel: No bowel obstruction or ileus. Distal colonic diverticulosis without diverticulitis. No bowel wall thickening or inflammatory change. Vascular/Lymphatic: There is a 5.9 cm infrarenal abdominal aortic aneurysm, with no evidence of leak or rupture. Chronic calcification is seen within the ventral aspect of the thrombosed portion of the aneurysm. Diffuse aortic atherosclerosis. No pathologic adenopathy. Reproductive: Marked enlargement of the prostate measuring 5.8 x 5.6 cm.  Other: Moderate ascites, most pronounced within the bilateral flanks. No free intraperitoneal gas. No abdominal wall hernia. Musculoskeletal: Diffuse body wall edema. No acute or destructive bony abnormalities. Reconstructed images demonstrate no additional findings. Review of the MIP images confirms the above findings. IMPRESSION: Chest: 1. Decreased enhancement of the right middle and right lower lobe pulmonary arteries, without definitive filling defect identified. Differential diagnosis would include decreased perfusion due to chronic consolidation versus pulmonary embolus. 2. Rounded masslike consolidation in the right lower lobe infrahilar region measuring up to 3.6 cm. The appearance is concerning for neoplasm, though underlying infection cannot be excluded. Pulmonology follow-up recommended. 3. Large right pleural effusion volume estimated in excess of 2 L. 4. Volume loss and peripheral consolidation within the right lower lobe, consistent with atelectasis. It is unclear whether this is postobstructive collapse or sys compressive atelectasis from the right pleural effusion. 5. Cardiomegaly, with prominent left ventricular dilatation. Small mural thrombi at the left ventricular apex best seen on the delayed contrast series on the corresponding CT abdomen exam. Echocardiography recommended. 6. Reflux of contrast into the hepatic veins consistent with cardiac dysfunction.  7.  Aortic Atherosclerosis (ICD10-I70.0). Abdomen/pelvis: 1. 5.9 cm infrarenal abdominal aortic aneurysm. No evidence of aortic leak or rupture. Recommend follow-up CT or MR as appropriate in 6 months and referral to or continued care with vascular specialist. (Ref.: J Vasc Surg. 2018; 67:2-77 and J Am Coll Radiol 2013;10(10):789-794.) 2. Moderate ascites. 3. Enlarged prostate, with evidence of chronic bladder outlet obstruction. 4. Nonobstructing 2.1 cm bladder calculus. 5. Sigmoid diverticulosis without diverticulitis. 6.  Aortic  Atherosclerosis (ICD10-I70.0). 7. Diffuse body wall edema. Critical Value/emergent results were called by telephone at the time of interpretation on 04/24/2023 at 550 pm to provider [facility] , who verbally acknowledged these results. Electronically Signed   By: Sharlet Salina M.D.   On: 04/24/2023 18:02   CT Head Wo Contrast Result Date: 04/24/2023 CLINICAL DATA:  Losing weight, decreased energy for months. EXAM: CT HEAD WITHOUT CONTRAST TECHNIQUE: Contiguous axial images were obtained from the base of the skull through the vertex without intravenous contrast. RADIATION DOSE REDUCTION: This exam was performed according to the departmental dose-optimization program which includes automated exposure control, adjustment of the mA and/or kV according to patient size and/or use of iterative reconstruction technique. COMPARISON:  CT head dated 07/24/2004. FINDINGS: Brain: No evidence of acute infarction, hemorrhage, hydrocephalus, extra-axial collection or mass lesion/mass effect. There is small to moderate volume encephalomalacia of the left frontal lobe superomedially and small-to-moderate volume encephalomalacia of the right occipital lobe. There is mild cerebral volume loss with associated ex vacuo dilatation. Periventricular Keesling matter hypoattenuation likely represents chronic small vessel ischemic disease. Vascular: There are vascular calcifications in the carotid siphons. Skull: Normal. Negative for fracture or focal lesion. Sinuses/Orbits: No acute finding. Other: None. IMPRESSION: 1. No acute intracranial process. 2. Small to moderate volume encephalomalacia of the left frontal lobe and right occipital lobe. Electronically Signed   By: Romona Curls M.D.   On: 04/24/2023 17:48   DG Chest 2 View Result Date: 04/24/2023 CLINICAL DATA:  Weakness. EXAM: CHEST - 2 VIEW COMPARISON:  07/12/2016. FINDINGS: Cardiac silhouette borderline enlarged. Left coronary artery stent. No mediastinal or hilar masses. No  convincing adenopathy. Moderate right and small left pleural effusions. Additional opacity at the right lung base which may reflect atelectasis or pneumonia. Vascular congestion and bilateral interstitial thickening. Skeletal structures are grossly intact. IMPRESSION: 1. Findings consistent with congestive heart failure. Moderate right and small left pleural effusions. 2. Right lung base opacity may reflect atelectasis or pneumonia. Electronically Signed   By: Amie Portland M.D.   On: 04/24/2023 16:11    ECHO as above  TELEMETRY reviewed by me 04/29/2023: Sinus rhythm PVCs rate 70s  EKG reviewed by me: Sinus rhythm rate 88 bpm  Data reviewed by me 04/29/2023: last 24h vitals tele labs imaging I/O hospitalist progress note, neurology notes, advanced heart failure notes  Principal Problem:   Acute stroke due to ischemia [facility]) Active Problems:   Benign prostatic hyperplasia   Benign essential hypertension   Tobacco use disorder   Coronary artery disease involving native coronary artery of native heart with angina pectoris (HCC)   Acute respiratory failure with hypoxia (HCC)   CKD stage 3a, GFR 45-59 ml/min (HCC)   Severe sepsis (HCC)   Pleural effusion on right   Mass of right lung   LV (left ventricular) mural thrombus   Acute on chronic systolic CHF (congestive heart failure) (HCC)   AAA (abdominal aortic aneurysm) (HCC)   Involuntary commitment    ASSESSMENT AND PLAN:  Laverta Baltimore  is a 68 y.o. male  with a past medical history of hypertension, hyperlipidemia, tobacco use, BPH who presented to the ED on 04/24/2023 for generalized weakness. Cardiology was consulted for further evaluation.   # Acute CVA # LV apical thrombus # Acute systolic heart failure # Ischemic cardiomyopathy Patient initially presented for generalized weakness found to have LV apical thrombus on CTA chest.  Also with significant lower extremity edema and dyspnea on exertion for the last few weeks.  BNP elevated  greater than 4500.  MRI head today with 2 acute infarcts.  Echo read this morning demonstrates EF less than 20% with global hypokinesis. cMRI this admission with subendocardial LGE LV basal-mid inferior wall and LV mid-apical anterior, anteroseptal, and apical walls. He is without anginal symptoms.  -Continue torsemide 20 mg daily.  -Continue metoprolol succinate 25 mg daily, spironolactone 25 mg daily, losartan 50 mg daily.  Consider further additions to GDMT pending BP and renal function. -Advanced heart failure following.  -Continue to monitor renal function closely with diuresis. -Given multiple comorbidities, patient would not be a candidate for advanced therapies as per advanced heart failure team. Given this will defer LifeVest.  -Not a candidate for LHC at this time for ischemic evaluation due to acute CVA.  -Continue eliquis, aspirin 81 mg daily, and atorvastatin 80 mg daily.   # RLL lung mass # Large R pleural effusion # Pulmonary embolus Patient with right lower lobe lung mass noted on CTA chest as well as large right pleural effusion.  Concern for malignancy. -S/p thoracentesis 04/25/2023 with 1 L yielded -Further management per primary team. -Continue eliquis.   Cardiology will sign off. Please haiku with questions or re-engage if needed.  Heart failure appointment scheduled for 1/28 at 3 PM at [facility].   This patient's plan of care was discussed and created with Dr. Melton Alar and she is in agreement.  Signed: Gale Journey, PA-C  04/29/2023, 8:24 AM [facility]

## 2023-04-29 NOTE — Plan of Care (Signed)

## 2023-04-29 NOTE — Plan of Care (Signed)
  Problem: Education: Goal: Knowledge of disease or condition will improve Outcome: Progressing   Problem: Ischemic Stroke/TIA Tissue Perfusion: Goal: Complications of ischemic stroke/TIA will be minimized Outcome: Progressing   Problem: Coping: Goal: Will verbalize positive feelings about self Outcome: Progressing   Problem: Self-Care: Goal: Ability to participate in self-care as condition permits will improve Outcome: Progressing   Problem: Nutrition: Goal: Risk of aspiration will decrease Outcome: Progressing   Problem: Clinical Measurements: Goal: Ability to maintain clinical measurements within normal limits will improve Outcome: Progressing   Problem: Activity: Goal: Risk for activity intolerance will decrease Outcome: Progressing   Problem: Pain Managment: Goal: General experience of comfort will improve and/or be controlled Outcome: Progressing   Problem: Safety: Goal: Ability to remain free from injury will improve Outcome: Progressing   Problem: Skin Integrity: Goal: Risk for impaired skin integrity will decrease Outcome: Progressing

## 2023-04-29 NOTE — Progress Notes (Signed)
[facility]  - [place] [facility]   PATIENT NAME: Dennis Church    MR#:  [id number]  DATE OF BIRTH:  Aug 30, 1955  SUBJECTIVE:  no family at bedside. Spoke with son Dennis Church on the phone. Patient has been having intermittent bouts of confusion. He is oriented to place in person. Tends to forget what is going on with him. Tolerating PO ensure suggested VITALS:  Blood pressure 122/77, pulse 73, temperature 97.8 F (36.6 C), resp. rate 18, height 5\' 5"  (1.651 m), weight 57.1 kg, SpO2 97%.  PHYSICAL EXAMINATION:   GENERAL:  68 y.o.-year-old patient with no acute distress. Chronically ill thin malnourished LUNGS: distant breath sounds bilaterally, no wheezing CARDIOVASCULAR: S1, S2 normal. No murmur   ABDOMEN: Soft, nontender, nondistended.  EXTREMITIES: No  edema b/l.    NEUROLOGIC: nonfocal  patient is alert but has baseline confusion  LABORATORY PANEL:  CBC Recent Labs  Lab 04/28/23 0424  WBC 5.3  HGB 14.6  HCT 44.3  PLT 99*    Chemistries  Recent Labs  Lab 04/25/23 0055 04/26/23 0427 04/28/23 0424 04/29/23 0551  NA 136   < > 129* 130*  K 4.1   < > 4.0 3.9  CL 100   < > 91* 90*  CO2 23   < > 28 29  GLUCOSE 96   < > 91 96  BUN 29*   < > 27* 24*  CREATININE 1.47*   < > 1.33* 1.25*  CALCIUM 8.5*   < > 8.3* 8.8*  MG  --    < > 2.3  --   AST 26  --   --   --   ALT 22  --   --   --   ALKPHOS 87  --   --   --   BILITOT 2.5*  --   --   --    < > = values in this interval not displayed.   Cardiac Enzymes No results for input(s): "TROPONINI" in the last 168 hours. RADIOLOGY:  No results found.    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  Patient was brought in by a neighbor.  Patient states that he has been falling a lot.   Patient was found to have acute respiratory failure and  a large pleural effusion with a mass underlying.  Patient had a Foley catheter placed.  Patient was started on IV heparin for mural thrombus seen on CT scan.  Echocardiogram pending.   1/20.  Patient states that he has been having trouble with his balance at home.  MRI of the brain showed 2 small acute infarcts in left [place] and slower absent flow in the right vertebral artery likely chronic.  Patient pulled out an IV.   Patient involuntarily committed.  Patient unable to give [place] a good reason on why he needs to leave today.  Needs further workup here in the hospital.   1.9 L drawn off from underneath the right lung by interventional radiology   1/21.  Cytology still pending.  EF less than 20%.  Patient on 4 L of oxygen this morning. 1/22: assumed care-- patient has settled in the room. He appears pleasant and calm. Discussed and updated his medical issues. He is agreeing to stay and complete the work up. Discussed with him regarding discontinuing IVC. Son Dennis Church was  informed. Will discontinue IVC tomorrow morning. Patient is in agreement. 1/23: confused intermittently 1/24: patient trying to get up from the bed yesterday. Had a non-traumatic fall. Sitter placed. Hard to orient at times. Son Dennis Church. Eating solid food last however drinking ensure, applesauce   Assessment and Plan:   Acute stroke due to ischemia [facility]) --Neurology believes that this could be hypercoagulability versus small vessel etiology.  -- Currently on Eliquis.  As per neurology no need for aspirin.  -- LDL 84.  Start Lipitor. --Cotn PT/OT--rehab    Acute on chronic systolic CHF (congestive heart failure) (HCC) --EF less than 20% with right ventricular failure also.  Overall prognosis poor.   --Patient on Lasix 40 mg IV twice a day.  Patient started on Aldactone, Imdur,Cozaar also. --followed by [facility] HF team and [facility] cardiology --pt will need life vest prior to dc  -- patient now on PO torsemide  Mass of right  lung Large Right pleural effusion --Thoracentesis on 1/20, With cytology since patient has a lung mass. --1.9 L removed on 1/20 by interventional radiology with thoracentesis. -- Pleural fluid cytology negative for malignancy -- case discussed with [name] pulmonary. Patient currently is not a candidate for bronchoscopy/EBUS given other medical comorbidities. -- Oncology consultation with [name]. She will set up patient as outpatient for full work up once patient is done with rehab.   LV (left ventricular) mural thrombus Not commented on echocardiogram but CT scan.   --On Eliquis.   --CT scan could not rule out pulmonary embolism--now on rx dose of eliquis    AAA (abdominal aortic aneurysm) (HCC) --5.9 AAA seen on CT scan.  Not stable enough for any intervention currently.   Acute respiratory failure with hypoxia (HCC) --Patient with hypoxia in the emergency room of 88%.  Placed on 4 L.   Benign prostatic hyperplasia --Continue Flomax.     CKD stage 3a, GFR 45-59 ml/min (HCC) Today's creatinine 1.55  with a GFR of 48   Benign essential hypertension --Currently on Cozaar, Aldactone, imdur   Severe sepsis (HCC) --Ruled out   Tobacco use disorder --Nicotine patch.    TOC for discharge planning to rehab  Patient overall has a poor long-term prognosis. Palliative care consultation  Spoke with patient's son Dennis Church on the phone. He will come tomorrow and will have meeting with palliative care and discussed long-term prognosis and discharge planning.    Procedures: right-sided thoracentesis Family communication : spoke with son Dennis Church on the phone  Consults : cardiology, neurology CODE STATUS: full DVT Prophylaxis : eliquis Level of care: Med-Surg Status is: Inpatient Remains inpatient appropriate because: acute stroke, congestive heart failure, right lung mass worker pending    TOTAL TIME TAKING CARE OF THIS PATIENT: 35 minutes.  >50% time spent on  counselling and coordination of care  Note: This dictation was prepared with Dragon dictation along with smaller phrase technology. Any transcriptional errors that result from this process are unintentional.  Enedina Finner M.D    [facility]   CC: Primary care physician; Patient, No Pcp Per

## 2023-04-30 DIAGNOSIS — I639 Cerebral infarction, unspecified: Secondary | ICD-10-CM | POA: Diagnosis not present

## 2023-04-30 DIAGNOSIS — J90 Pleural effusion, not elsewhere classified: Secondary | ICD-10-CM | POA: Diagnosis not present

## 2023-04-30 DIAGNOSIS — Z515 Encounter for palliative care: Secondary | ICD-10-CM | POA: Diagnosis not present

## 2023-04-30 DIAGNOSIS — I255 Ischemic cardiomyopathy: Secondary | ICD-10-CM

## 2023-04-30 LAB — GLUCOSE, CAPILLARY
Glucose-Capillary: 118 mg/dL — ABNORMAL HIGH (ref 70–99)
Glucose-Capillary: 123 mg/dL — ABNORMAL HIGH (ref 70–99)
Glucose-Capillary: 91 mg/dL (ref 70–99)

## 2023-04-30 MED ORDER — METOPROLOL SUCCINATE ER 25 MG PO TB24: 25.0000 mg | ORAL_TABLET | Freq: Every day | ORAL | 2 refills | Status: DC

## 2023-04-30 MED ORDER — LOSARTAN POTASSIUM 50 MG PO TABS: 50.0000 mg | ORAL_TABLET | Freq: Every day | ORAL | 2 refills | Status: DC

## 2023-04-30 MED ORDER — NICOTINE 21 MG/24HR TD PT24: 21.0000 mg | MEDICATED_PATCH | Freq: Every day | TRANSDERMAL | 0 refills | Status: AC

## 2023-04-30 MED ORDER — SPIRONOLACTONE 25 MG PO TABS: 25.0000 mg | ORAL_TABLET | Freq: Every day | ORAL | 2 refills | Status: AC

## 2023-04-30 MED ORDER — ENSURE ENLIVE PO LIQD: 237.0000 mL | Freq: Two times a day (BID) | ORAL | 12 refills | Status: DC

## 2023-04-30 MED ORDER — APIXABAN 5 MG PO TABS: ORAL_TABLET | ORAL | 2 refills | Status: DC

## 2023-04-30 MED ORDER — TAMSULOSIN HCL 0.4 MG PO CAPS: 0.4000 mg | ORAL_CAPSULE | Freq: Every day | ORAL | 2 refills | Status: DC

## 2023-04-30 MED ORDER — TORSEMIDE 20 MG PO TABS: 20.0000 mg | ORAL_TABLET | Freq: Every day | ORAL | 1 refills | Status: AC

## 2023-04-30 NOTE — TOC Progression Note (Addendum)
Transition of Care [facility]) - Progression Note    Patient Details  Name: Dennis Church MRN: [id number] Date of Birth: October 03, 1955  Transition of Care [facility]) CM/SW Contact  Bing Quarry, RN Phone Number: 04/30/2023, 3:30 PM  Clinical Narrative:  1/25: Patient does not have a PCP to be able to receive [facility] agency services. Not able to to outpatient at this time. Discussed with son and will printed Medicare SNF choice list to review with patient to see if can be accepted at a SNF for STR.  Will follow up with son and start bed search if all agreeable.   Spoke with son again, to clear up some questions about what STR/SNF was and the process of choice or preferences, bed offers, insurance authorization if required, and he said he wanted to honor his father's wishes and take him home and they would take care of him. I explained again that without a PCP, that [facility] services could not be ordered on discharge from the hospital. He verbalized understanding of this and repeated that they would take care of things and get what was needed, that his spouse, the patient DIL was a Charity fundraiser as well.   Updated provider via secure chat. 5 pm.   Could not find Eliquis coupon for Medicare. Discussed with son over the phone after they left the hospital. He spoke to patient who said he had Rx coverage, but encouraged to speak to pharmacist tomorrow if not for any coupons he would be eligible for and about GoodRx. He verbalized understanding (son). Also to speak to PCP if it was not affordable due to importance of medication.    Gabriel Cirri MSN RN CM  RN Case Manager [place]  Transitions of Care Direct Dial: [phone] (Weekends Only) [facility] Main Office Phone: [phone] [facility] Fax: [phone] [email or web address]          Expected Discharge Plan and Services         Expected Discharge Date: 04/30/23                                     Social Determinants of Health (SDOH) Interventions SDOH Screenings    Food Insecurity: No Food Insecurity (04/25/2023)  Housing: High Risk (04/26/2023)  Transportation Needs: No Transportation Needs (04/25/2023)  Utilities: Not At Risk (04/25/2023)  Social Connections: Socially Isolated (04/25/2023)  Tobacco Use: Medium Risk (04/26/2023)    Readmission Risk Interventions     No data to display

## 2023-04-30 NOTE — Discharge Instructions (Signed)
Abstain from smoking Establish PCP in the area

## 2023-04-30 NOTE — Discharge Summary (Signed)
Physician Discharge Summary   Patient: Dennis Church MRN: [id number] DOB: [birth year]  Admit date:     04/24/2023  Discharge date: 04/30/23  Discharge Physician: Dennis Church   PCP: Dennis Church   Recommendations at discharge:    F/u Dennis Church in 1-2 weeks F/u Dennis Church on your appt Establish PCP in the area  Discharge Diagnoses: Principal Problem:   Acute stroke due to ischemia [facility]) Active Problems:   Acute on chronic systolic CHF (congestive heart failure) (HCC)   Mass of right lung   Pleural effusion on right   LV (left ventricular) mural thrombus   Acute respiratory failure with hypoxia (HCC)   AAA (abdominal aortic aneurysm) (HCC)   Coronary artery disease involving native coronary artery of native heart with angina pectoris (HCC)   Benign prostatic hyperplasia   CKD stage 3a, GFR 45-59 ml/min (HCC)   Benign essential hypertension   Severe sepsis (HCC)   Tobacco use disorder   Involuntary commitment  68 year old man past medical history of CIDP, generalized weakness, BPH, hypertension, tobacco abuse, hyperlipidemia presented with generalized weakness for the last 2 to 3 months with weight loss and fatigue.  Patient was brought in by a neighbor.  Patient states that he has been falling a lot.   Patient was found to have acute respiratory failure and a large pleural effusion with a mass underlying.  Patient had a Foley catheter placed.  Patient was started on IV heparin for mural thrombus seen on CT scan.  Echocardiogram pending.   1/20.  Patient states that he has been having trouble with his balance at home.  MRI of the brain showed 2 small acute infarcts in left [place] and slower absent flow in the right vertebral artery likely chronic.  Patient pulled out an IV.   Patient involuntarily committed.  Patient unable to give [place] a good reason on why he needs to leave today.  Needs further workup here in the hospital.   1.9 L drawn off from underneath the right lung by  interventional radiology   1/21.  Cytology still pending.  EF less than 20%.  Patient on 4 L of oxygen this morning. 1/22: assumed care-- patient has settled in the room. He appears pleasant and calm. Discussed and updated his medical issues. He is agreeing to stay and complete the work up. Discussed with him regarding discontinuing IVC. Son Dennis Church was informed. Will discontinue IVC tomorrow morning. Patient is in agreement. 1/23: confused intermittently 1/24: patient trying to get up from the bed yesterday. Had a non-traumatic fall. Sitter placed. Hard to orient at times. Son Dennis Church. Eating solid food last however drinking ensure, applesauce 1/25: very alert and oriented. Looks better today then any other day. Met with son and DIL in the room.   Assessment and Plan:    Acute stroke due to ischemia [facility]) --Neurology believes that this could be hypercoagulability versus small vessel etiology.  -- Currently on Eliquis.  As Church neurology no need for aspirin.  -- LDL 84.  Start Lipitor. --Cotn PT/OT--rehab    Acute on chronic systolic CHF (congestive heart failure) (HCC) --EF less than 20% with right ventricular failure also.  Overall prognosis poor.   --Patient on Lasix 40 mg IV twice a day.  Patient started on Aldactone, Imdur,Cozaar also. --followed by [facility] HF team and [facility] cardiology -pt not a candidate for life vest or ICD due to thrombus  -- patient now on PO torsemide --sats 95% on RA  Mass of right lung Large Right pleural effusion --Thoracentesis on 1/20, With cytology since patient has a lung mass. --1.9 L removed on 1/20 by interventional radiology with thoracentesis. -- Pleural fluid cytology negative for malignancy -- case discussed with Dennis. Belia Church pulmonary. Patient currently is not a candidate for bronchoscopy/EBUS given other medical comorbidities. -- Oncology consultation with Dennis. Alena Church. She will set up patient as outpatient for full work up once patient is done with  rehab. --pt on RA   LV (left ventricular) mural thrombus Not commented on echocardiogram but CT scan.   --On Eliquis.   --CT scan could not rule out pulmonary embolism--now on rx dose of eliquis    AAA (abdominal aortic aneurysm) (HCC) --5.9 AAA seen on CT scan.  Not stable enough for any intervention currently.   Acute respiratory failure with hypoxia (HCC) --Patient with hypoxia in the emergency room of 88%.  Placed on 4 L.   Benign prostatic hyperplasia --Continue Flomax.     CKD stage 3a, GFR 45-59 ml/min (HCC) Today's creatinine 1.55  with a GFR of 48   Benign essential hypertension --Currently on Cozaar, Aldactone, imdur   Severe sepsis (HCC) --Ruled out   Tobacco use disorder --Nicotine patch.       Patient overall has a poor long-term prognosis. Palliative care consultation--appreciated. Pt to f/u as out pt   Spoke with son and DIL. Pt wants to go home. [employer] will not approve HH w/o PCP. Pt does not want to go to rehab. Son wants to take pt home and Church [facility] note will take care of his needs. Recommended son to get PCP. F/u cardiology and Oncology as out pt       Procedures: right-sided thoracentesis Family communication : spoke with son Dennis Church and DIL at bedside Consults : cardiology, neurology CODE STATUS: full DVT Prophylaxis : eliquis Level of care: Med-Surg      Disposition: Home Diet recommendation:  Discharge Diet Orders (From admission, onward)     Start     Ordered   04/30/23 0000  Diet - low sodium heart healthy        04/30/23 1438           Cardiac diet DISCHARGE MEDICATION: Allergies as of 04/30/2023   No Known Allergies      Medication List     STOP taking these medications    amLODipine 5 MG tablet Commonly known as: NORVASC   aspirin EC 81 MG tablet   clopidogrel 75 MG tablet Commonly known as: PLAVIX   metoprolol tartrate 25 MG tablet Commonly known as: LOPRESSOR       TAKE these medications    apixaban  5 MG Tabs tablet Commonly known as: ELIQUIS Take 2 tablets (10 mg total) by mouth 2 (two) times daily for 2 days, THEN 1 tablet (5 mg total) 2 (two) times daily. Start taking on: April 30, 2023   atorvastatin 80 MG tablet Commonly known as: LIPITOR Take 80 mg by mouth every evening.   feeding supplement Liqd Take 237 mLs by mouth 2 (two) times daily between meals. Start taking on: May 01, 2023   isosorbide mononitrate 60 MG 24 hr tablet Commonly known as: IMDUR Take 60 mg by mouth daily.   losartan 50 MG tablet Commonly known as: COZAAR Take 1 tablet (50 mg total) by mouth daily. Start taking on: May 01, 2023   metoprolol succinate 25 MG 24 hr tablet Commonly known as: TOPROL-XL Take 1 tablet (25 mg  total) by mouth daily. Start taking on: May 01, 2023   nicotine 21 mg/24hr patch Commonly known as: NICODERM CQ - dosed in mg/24 hours Place 1 patch (21 mg total) onto the skin daily. Start taking on: May 01, 2023   nitroGLYCERIN 0.4 MG SL tablet Commonly known as: NITROSTAT Place 0.4 mg under the tongue every 5 (five) minutes x 3 doses as needed.   spironolactone 25 MG tablet Commonly known as: ALDACTONE Take 1 tablet (25 mg total) by mouth daily. Start taking on: May 01, 2023   tamsulosin 0.4 MG Caps capsule Commonly known as: FLOMAX Take 1 capsule (0.4 mg total) by mouth daily.   torsemide 20 MG tablet Commonly known as: DEMADEX Take 1 tablet (20 mg total) by mouth daily. Start taking on: May 01, 2023        Follow-up Information     Custovic, Rozell Searing, DO Follow up.   Specialty: Cardiology Why: Heart failure appointment scheduled for 1/28 at 3 PM at [facility]  Follow up scheduled for 2/24 at 10:30 AM with Dennis. Melton Church at [facility] Contact information: [address] [place] [address] [phone]         Michaelyn Barter, MD. Go to.   Specialty: Oncology Why: go to the appt that will set up by Cancer center  for Right lung mass evaluation Contact information: [address] [place] [address] [phone]                Discharge Exam: Filed Weights   04/26/23 0500 04/27/23 0347 04/29/23 0405  Weight: 73 kg 64 kg 57.1 kg  GENERAL:  68 y.o.-year-old patient with no acute distress. Chronically ill thin malnourished LUNGS: distant breath sounds bilaterally, no wheezing CARDIOVASCULAR: S1, S2 normal. No murmur   ABDOMEN: Soft, nontender, nondistended.  EXTREMITIES: No  edema b/l.    NEUROLOGIC: nonfocal  patient is alert but has baseline confusion   Condition at discharge: fair  The results of significant diagnostics from this hospitalization (including imaging, microbiology, ancillary and laboratory) are listed below for reference.   Imaging Studies: MR CARDIAC MORPHOLOGY W WO CONTRAST Result Date: 04/27/2023 CLINICAL DATA:  Cardiomyopathy, LV thrombus EXAM: CARDIAC MRI TECHNIQUE: The patient was scanned on a 1.5 Tesla Siemens magnet. A dedicated cardiac coil was used. Functional imaging was done using Fiesta sequences. 2,3, and 4 chamber views were done to assess for RWMA's. Modified Simpson's rule using a short axis stack was used to calculate an ejection fraction on a dedicated work [employer], [employer]. The patient received 10 cc of Gadavist. After 10 minutes inversion recovery sequences were used to assess for infiltration and scar tissue. Velocity flow mapping performed in the ascending aorta and main pulmonary artery. CONTRAST:  10 cc  of Gadavist FINDINGS: 1. Normal left ventricular size, mild LV thickness and systolic function (LVEF =23%). There is global hypokinesis. There is near transmural (75%) subendocardial late gadolinium enhancement in the left ventricular myocardial basal-mid inferior wall. There is subendocardial LGE (50%) in the mid-apical LV segments involving the anterior, anteroseptal and apical walls. Total LV LGE scar 34g, approximately 20% of total  LV myocardial mass. LVEDV: 194 ml LVESV: 150 ml SV: 44 ml CO: 3.2 L/min Myocardial mass: 167 g 2. Normal right ventricular size and thickness. Mildly reduced LV systolic function (RVEF =37%). There are no regional wall motion abnormalities. 3.  Mildly dilated left and right atrium. 4. Normal size of the aortic root, ascending aorta and pulmonary artery. 5.  Mild mitral and tricuspid regurgitation. 6.  Normal pericardium.  No pericardial effusion. IMPRESSION: 1.  Severely reduced LV systolic function.  LVEF 23%. 2. Near transmural (75%) subendocardial LGE in the LV basal-mid inferior wall (non viable). 3. Subendocardial LGE (50%) in the mid-apical LV segments involving the anterior, anteroseptal and apical walls (appear viable). 4. Total LV LGE/scar is 34g, approximately 20% of total LV myocardial mass. 5.  Mildly reduced RV function. 6. 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 Electronically Signed   By: Debbe Odea M.D.   On: 04/27/2023 13:40   MR CARDIAC VELOCITY FLOW MAP Result Date: 04/27/2023 CLINICAL DATA:  Cardiomyopathy, LV thrombus EXAM: CARDIAC MRI TECHNIQUE: The patient was scanned on a 1.5 Tesla Siemens magnet. A dedicated cardiac coil was used. Functional imaging was done using Fiesta sequences. 2,3, and 4 chamber views were done to assess for RWMA's. Modified Simpson's rule using a short axis stack was used to calculate an ejection fraction on a dedicated work [employer] officer, [employer]. The patient received 10 cc of Gadavist. After 10 minutes inversion recovery sequences were used to assess for infiltration and scar tissue. Velocity flow mapping performed in the ascending aorta and main pulmonary artery. CONTRAST:  10 cc  of Gadavist FINDINGS: 1. Normal left ventricular size, mild LV thickness and systolic function (LVEF =23%). There is global hypokinesis. There is near transmural (75%) subendocardial late gadolinium  enhancement in the left ventricular myocardial basal-mid inferior wall. There is subendocardial LGE (50%) in the mid-apical LV segments involving the anterior, anteroseptal and apical walls. Total LV LGE scar 34g, approximately 20% of total LV myocardial mass. LVEDV: 194 ml LVESV: 150 ml SV: 44 ml CO: 3.2 L/min Myocardial mass: 167 g 2. Normal right ventricular size and thickness. Mildly reduced LV systolic function (RVEF =37%). There are no regional wall motion abnormalities. 3.  Mildly dilated left and right atrium. 4. Normal size of the aortic root, ascending aorta and pulmonary artery. 5.  Mild mitral and tricuspid regurgitation. 6.  Normal pericardium.  No pericardial effusion. IMPRESSION: 1.  Severely reduced LV systolic function.  LVEF 23%. 2. Near transmural (75%) subendocardial LGE in the LV basal-mid inferior wall (non viable). 3. Subendocardial LGE (50%) in the mid-apical LV segments involving the anterior, anteroseptal and apical walls (appear viable). 4. Total LV LGE/scar is 34g, approximately 20% of total LV myocardial mass. 5.  Mildly reduced RV function. 6. 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 Electronically Signed   By: Debbe Odea M.D.   On: 04/27/2023 13:40   MR CARDIAC VELOCITY FLOW MAP Result Date: 04/27/2023 CLINICAL DATA:  Cardiomyopathy, LV thrombus EXAM: CARDIAC MRI TECHNIQUE: The patient was scanned on a 1.5 Tesla Siemens magnet. A dedicated cardiac coil was used. Functional imaging was done using Fiesta sequences. 2,3, and 4 chamber views were done to assess for RWMA's. Modified Simpson's rule using a short axis stack was used to calculate an ejection fraction on a dedicated work [employer] officer, [employer]. The patient received 10 cc of Gadavist. After 10 minutes inversion recovery sequences were used to assess for infiltration and scar tissue. Velocity flow mapping performed in the ascending aorta and main  pulmonary artery. CONTRAST:  10 cc  of Gadavist FINDINGS: 1. Normal left ventricular size, mild LV thickness and systolic function (LVEF =23%). There is global hypokinesis. There is near transmural (75%) subendocardial late gadolinium enhancement in the left ventricular  myocardial basal-mid inferior wall. There is subendocardial LGE (50%) in the mid-apical LV segments involving the anterior, anteroseptal and apical walls. Total LV LGE scar 34g, approximately 20% of total LV myocardial mass. LVEDV: 194 ml LVESV: 150 ml SV: 44 ml CO: 3.2 L/min Myocardial mass: 167 g 2. Normal right ventricular size and thickness. Mildly reduced LV systolic function (RVEF =37%). There are no regional wall motion abnormalities. 3.  Mildly dilated left and right atrium. 4. Normal size of the aortic root, ascending aorta and pulmonary artery. 5.  Mild mitral and tricuspid regurgitation. 6.  Normal pericardium.  No pericardial effusion. IMPRESSION: 1.  Severely reduced LV systolic function.  LVEF 23%. 2. Near transmural (75%) subendocardial LGE in the LV basal-mid inferior wall (non viable). 3. Subendocardial LGE (50%) in the mid-apical LV segments involving the anterior, anteroseptal and apical walls (appear viable). 4. Total LV LGE/scar is 34g, approximately 20% of total LV myocardial mass. 5.  Mildly reduced RV function. 6. 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 Electronically Signed   By: Debbe Odea M.D.   On: 04/27/2023 13:40   ECHOCARDIOGRAM COMPLETE Result Date: 04/26/2023    ECHOCARDIOGRAM REPORT   Patient Name:   PAOLA FLYNT Egler Date of Exam: 04/25/2023 Medical Rec #:  [id number]     Height:       65.0 in Accession #:    [id number]    Weight:       157.0 lb Date of Birth:  [birth year]     BSA:          1.785 m Patient Age:    67 years      BP:           117/105 mmHg Patient Gender: M             HR:           81 bpm. Exam Location:  [facility] Procedure: 2D Echo,  Cardiac Doppler, Color Doppler and Intracardiac            Opacification Agent Indications:     Other abnormalities of the heart  History:         Patient has no prior history of Echocardiogram examinations.                  CAD, Stroke; Risk Factors:Hypertension, Dyslipidemia and                  Current Smoker. CKD, Mural thrombus.  Sonographer:     Mikki Harbor Referring Phys:  [id number] CARALYN HUDSON Diagnosing Phys: Rozell Searing Custovic IMPRESSIONS  1. Left ventricular ejection fraction, by estimation, is <20%. The left ventricle has severely decreased function. The left ventricle demonstrates global hypokinesis. The left ventricular internal cavity size was moderately dilated. Left ventricular diastolic parameters are consistent with Grade I diastolic dysfunction (impaired relaxation).  2. Severe RV volume and pressure overload. Right ventricular systolic function is severely reduced. The right ventricular size is moderately enlarged. There is moderately elevated pulmonary artery systolic pressure. The estimated right ventricular systolic pressure is 50.0 mmHg.  3. Left atrial size was severely dilated.  4. Right atrial size was severely dilated.  5. The mitral valve is grossly normal. Moderate mitral valve regurgitation.  6. Tricuspid valve regurgitation is moderate.  7. The aortic valve is grossly normal. Aortic valve regurgitation is trivial. Aortic valve sclerosis/calcification is present, without any evidence of aortic stenosis. FINDINGS  Left Ventricle: Left ventricular ejection  fraction, by estimation, is <20%. The left ventricle has severely decreased function. The left ventricle demonstrates global hypokinesis. Definity contrast agent was given IV to delineate the left ventricular endocardial borders. The left ventricular internal cavity size was moderately dilated. There is borderline left ventricular hypertrophy. Left ventricular diastolic parameters are consistent with Grade I diastolic dysfunction  (impaired relaxation). Right Ventricle: Severe RV volume and pressure overload. The right ventricular size is moderately enlarged. No increase in right ventricular wall thickness. Right ventricular systolic function is severely reduced. There is moderately elevated pulmonary artery systolic pressure. The tricuspid regurgitant velocity is 2.96 m/s, and with an assumed right atrial pressure of 15 mmHg, the estimated right ventricular systolic pressure is 50.0 mmHg. Left Atrium: Left atrial size was severely dilated. Right Atrium: Right atrial size was severely dilated. Pericardium: There is no evidence of pericardial effusion. Mitral Valve: The mitral valve is grossly normal. Moderate mitral valve regurgitation. MV peak gradient, 2.3 mmHg. The mean mitral valve gradient is 1.0 mmHg. Tricuspid Valve: The tricuspid valve is grossly normal. Tricuspid valve regurgitation is moderate. Aortic Valve: The aortic valve is grossly normal. Aortic valve regurgitation is trivial. Aortic valve sclerosis/calcification is present, without any evidence of aortic stenosis. Aortic valve mean gradient measures 8.0 mmHg. Aortic valve peak gradient measures 14.3 mmHg. Aortic valve area, by VTI measures 0.95 cm. Pulmonic Valve: The pulmonic valve was grossly normal. Pulmonic valve regurgitation is not visualized. Aorta: The aortic root is normal in size and structure. IAS/Shunts: No atrial level shunt detected by color flow Doppler.  LEFT VENTRICLE PLAX 2D LVIDd:         4.20 cm LVIDs:         3.20 cm LV PW:         1.30 cm LV IVS:        1.30 cm LVOT diam:     1.90 cm LV SV:         35 LV SV Index:   20 LVOT Area:     2.84 cm  RIGHT VENTRICLE RV Basal diam:  4.45 cm RV Mid diam:    4.30 cm RV S prime:     6.66 cm/s LEFT ATRIUM             Index        RIGHT ATRIUM           Index LA diam:        4.40 cm 2.47 cm/m   RA Area:     22.10 cm LA Vol (A2C):   88.9 ml 49.81 ml/m  RA Volume:   79.50 ml  44.54 ml/m LA Vol (A4C):   79.3 ml  44.43 ml/m LA Biplane Vol: 85.7 ml 48.02 ml/m  AORTIC VALVE                     PULMONIC VALVE AV Area (Vmax):    1.00 cm      PV Vmax:       0.92 m/s AV Area (Vmean):   0.95 cm      PV Peak grad:  3.4 mmHg AV Area (VTI):     0.95 cm AV Vmax:           189.00 cm/s AV Vmean:          127.000 cm/s AV VTI:            0.369 m AV Peak Grad:      14.3 mmHg AV Mean Grad:  8.0 mmHg LVOT Vmax:         66.60 cm/s LVOT Vmean:        42.500 cm/s LVOT VTI:          0.124 m LVOT/AV VTI ratio: 0.34  AORTA Ao Root diam: 3.10 cm MITRAL VALVE               TRICUSPID VALVE MV Area (PHT): 3.10 cm    TR Peak grad:   35.0 mmHg MV Area VTI:   1.44 cm    TR Vmax:        296.00 cm/s MV Peak grad:  2.3 mmHg MV Mean grad:  1.0 mmHg    SHUNTS MV Vmax:       0.76 m/s    Systemic VTI:  0.12 m MV Vmean:      37.1 cm/s   Systemic Diam: 1.90 cm MV Decel Time: 245 msec MV E velocity: 73.70 cm/s MV A velocity: 64.30 cm/s MV E/A ratio:  1.15 Designer, multimedia signed by Clotilde Dieter Signature Date/Time: 04/26/2023/9:17:35 AM    Final    CT ANGIO HEAD NECK W WO CM Result Date: 04/25/2023 CLINICAL DATA:  Neuro deficit, acute, stroke suspected EXAM: CT ANGIOGRAPHY HEAD AND NECK WITH AND WITHOUT CONTRAST TECHNIQUE: Multidetector CT imaging of the head and neck was performed using the standard protocol during bolus administration of intravenous contrast. Multiplanar CT image reconstructions and MIPs were obtained to evaluate the vascular anatomy. Carotid stenosis measurements (when applicable) are obtained utilizing NASCET criteria, using the distal internal carotid diameter as the denominator. RADIATION DOSE REDUCTION: This exam was performed according to the departmental dose-optimization program which includes automated exposure control, adjustment of the mA and/or kV according to patient size and/or use of iterative reconstruction technique. CONTRAST:  75mL OMNIPAQUE IOHEXOL 350 MG/ML SOLN COMPARISON:  Same day brain MR  FINDINGS: CT HEAD FINDINGS Brain: No hemorrhage. No hydrocephalus. No extra-axial fluid collection. No mass effect. No mass lesion. 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 Generalized volume loss without lobar predominance. Vascular: No hyperdense vessel or unexpected calcification. Skull: Normal. Negative for fracture or focal lesion. Sinuses/Orbits: No middle ear or mastoid effusion. Paranasal sinuses are clear. Bilateral lens replacement. Orbits are otherwise unremarkable. Other: None. Review of the MIP images confirms the above findings CTA NECK FINDINGS Aortic arch: Standard branching. Imaged portion shows no evidence of aneurysm or dissection. Mild-to-moderate narrowing in the right subclavian artery Right carotid system: Moderate narrowing (approximately 50%) in the distal cervical ICA on the right (series 12, image 103). Left carotid system: No evidence of dissection, stenosis (50% or greater), or occlusion. Mild narrowing in the distal cervical ICA on the left. 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 Moderate to severe focal narrowing in the V1 segment of the left vertebral artery which arises from the aortic arch. Moderate narrowing in the V2 segment of the left vertebral artery. Skeleton: Negative. Other neck: Negative. Upper chest: Centrilobular and paraseptal emphysema. Small left pleural effusion. 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 Moderate stenosis in the cavernous segment of the left ICA (series 14, image 83). Moderate narrowing in the distal A2 segment of the right ACA (series 10, image 54). 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 No aneurysm. No vascular malformation. Venous sinuses: As permitted by contrast timing, patent. Anatomic variants: None  Review of the MIP images confirms the above  findings IMPRESSION: 1. No hemorrhage or CT evidence of an acute cortical infarct. Infarcts seen on same day brain MRI are not visualized on this exam due to CT technique. 2. No emergent large vessel occlusion. 3. 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 There is sever stenosis in the V1 segment of the left vertebral artery. 4. Moderate narrowing (approximately 50%) in the distal cervical ICA on the right. 5. Moderate to severe narrowing in the cavernous segments of bilateral ICAs. Emphysema (ICD10-J43.9). Electronically Signed   By: Lorenza Cambridge M.D.   On: 04/25/2023 17:52   US THORACENTESIS ASP PLEURAL SPACE W/IMG GUIDE Result Date: 04/25/2023 INDICATION: Patient is a 68 y/o male with history of CAD, HLD, HTN, MI, BPH and tobacco abuse. Patient presents for a diagnostic and therapeutic thoracentesis due to a right pleural effusion. EXAM: ULTRASOUND GUIDED RIGHT THORACENTESIS MEDICATIONS: 12mL of lidocaine 1% COMPLICATIONS: None immediate. PROCEDURE: An ultrasound guided thoracentesis was thoroughly discussed with the patient and questions answered. The benefits, risks, alternatives and complications were also discussed. The patient understands and wishes to proceed with the procedure. Written consent was obtained. Ultrasound was performed to localize and mark an adequate pocket of fluid in the right chest. The area was then prepped and draped in the normal sterile fashion. 1% Lidocaine was used for local anesthesia. Under ultrasound guidance a 6 Fr Safe-T-Centesis catheter was introduced. Thoracentesis was performed. The catheter was removed and a dressing applied. FINDINGS: A total of approximately 1.9L of clear, yellow fluid was removed. Samples were sent to the laboratory as requested by the clinical team. IMPRESSION: Successful ultrasound guided right thoracentesis yielding 1.9L of pleural fluid. Performed by Philipp Ovens PA-C  Electronically Signed   By: Malachy Moan M.D.   On: 04/25/2023 16:30   DG Chest Port 1 View Result Date: 04/25/2023 CLINICAL DATA:  68 year old male status post right thoracentesis EXAM: PORTABLE CHEST 1 VIEW COMPARISON:  Prior chest x-ray 04/24/2023 FINDINGS: No evidence of pneumothorax following right-sided thoracentesis. Significant interval reduction right pleural effusion. Stable cardiomegaly and mild pulmonary edema. IMPRESSION: No evidence of pneumothorax or other complication following right-sided thoracentesis. Electronically Signed   By: Malachy Moan M.D.   On: 04/25/2023 16:19   MR BRAIN W WO CONTRAST Result Date: 04/25/2023 CLINICAL DATA:  CNS neoplasm staging EXAM: MRI HEAD WITHOUT AND WITH CONTRAST TECHNIQUE: Multiplanar, multiecho pulse sequences of the brain and surrounding structures were obtained without and with intravenous contrast. CONTRAST:  7.22mL GADAVIST GADOBUTROL 1 MMOL/ML IV SOLN COMPARISON:  Head CT from yesterday FINDINGS: Brain: 2 punctate areas of restricted diffusion at the left upper putamen. Chronic infarcts scattered along the cerebral cortex, especially left superior frontal and right occipital. Ischemic gliosis in the cerebral Dahmen matter with chronic lacunes in the bilateral thalamus. Ischemic gliosis is extensive in the cerebral Kok matter. Brain atrophy is present. There are a few chronic blood products in the deep cerebellum, brainstem, left periatrial Pung matter, and left thalamus which are likely related to chronic small vessel disease. No abnormal enhancement Vascular: Absent right vertebral flow void just beyond the dura. Skull and upper cervical spine: Normal marrow signal Sinuses/Orbits: Negative IMPRESSION: 1. Two small acute infarcts in the left putamen. 2. 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 3. Slow or absent flow in the right vertebral artery, likely chronic in the absence of acute infarct in this distribution. 4. No  evidence  of metastatic disease. Electronically Signed   By: Tiburcio Pea M.D.   On: 04/25/2023 09:17   CT Angio Chest Pulmonary Embolism (PE) W or WO Contrast Result Date: 04/24/2023 CLINICAL DATA:  Weight loss, fatigue, weakness, sepsis EXAM: CT ANGIOGRAPHY CHEST CT ABDOMEN AND PELVIS WITH CONTRAST TECHNIQUE: Multidetector CT imaging of the chest was performed using the standard protocol during bolus administration of intravenous contrast. Multiplanar CT image reconstructions and MIPs were obtained to evaluate the vascular anatomy. Multidetector CT imaging of the abdomen and pelvis was performed using the standard protocol during bolus administration of intravenous contrast. RADIATION DOSE REDUCTION: This exam was performed according to the departmental dose-optimization program which includes automated exposure control, adjustment of the mA and/or kV according to patient size and/or use of iterative reconstruction technique. CONTRAST:  75mL OMNIPAQUE IOHEXOL 350 MG/ML SOLN COMPARISON:  04/24/2023, 06/30/2020, 07/12/2016 FINDINGS: CTA CHEST FINDINGS Cardiovascular: There is technically adequate opacification of the main pulmonary artery. Mixing artifact is identified. There is decreased contrast enhancement of the right middle and right lower lobe pulmonary arteries, which could be due to pulmonary emboli versus decreased perfusion due to presumed underlying chronic consolidation. No other filling defects are identified. The heart is enlarged, with mild left ventricular dilatation. Mural thrombus is seen within the left ventricular apex, reference image 7 of series 9 on the abdominal CT exam. There is reflux of contrast into the hepatic veins, suggesting an element of right sided cardiac dysfunction. No pericardial effusion. Normal caliber of the thoracic aorta. Evaluation of the aortic lumen is limited due to timing of the contrast bolus. Atherosclerosis of the aorta and coronary vasculature.  Mediastinum/Nodes: There are numerous subcentimeter mediastinal and hilar lymph nodes, without evidence of pathologic adenopathy. Thyroid, trachea, and esophagus are grossly normal. Lungs/Pleura: There is a large right pleural effusion, volume estimated in excess of 2 L. There is rounded masslike consolidation of the right lower lobe in the infrahilar region, measuring up to 3.6 cm reference image 91/2. Neoplasm cannot be excluded. Peripheral consolidation and volume loss elsewhere within the right lower lobe consistent with atelectasis. Indeterminate 4 mm left upper lobe pulmonary nodule reference image 19/3. Trace left pleural effusion. Upper lobe predominant emphysema.  No pneumothorax. Musculoskeletal: No acute or destructive bony abnormalities. Reconstructed images demonstrate no additional findings. Review of the MIP images confirms the above findings. CT ABDOMEN and PELVIS FINDINGS Hepatobiliary: No focal liver abnormality is seen. No gallstones, gallbladder wall thickening, or biliary dilatation. Pancreas: Unremarkable. No pancreatic ductal dilatation or surrounding inflammatory changes. Spleen: Normal in size without focal abnormality. Adrenals/Urinary Tract: Mild bilateral renal cortical atrophy and scattered areas of renal cortical scarring, right greater than left. No urinary tract calculi or obstructive uropathy. Bladder is moderately distended, with multiple bladder diverticula consistent with chronic bladder outlet obstruction. Nonobstructing 2.1 cm bladder calculus layers dependently. The adrenals are unremarkable. Stomach/Bowel: No bowel obstruction or ileus. Distal colonic diverticulosis without diverticulitis. No bowel wall thickening or inflammatory change. Vascular/Lymphatic: There is a 5.9 cm infrarenal abdominal aortic aneurysm, with no evidence of leak or rupture. Chronic calcification is seen within the ventral aspect of the thrombosed portion of the aneurysm. Diffuse aortic atherosclerosis.  No pathologic adenopathy. Reproductive: Marked enlargement of the prostate measuring 5.8 x 5.6 cm. Other: Moderate ascites, most pronounced within the bilateral flanks. No free intraperitoneal gas. No abdominal wall hernia. Musculoskeletal: Diffuse body wall edema. No acute or destructive bony abnormalities. Reconstructed images demonstrate no additional findings. Review of the MIP images confirms the above  findings. IMPRESSION: Chest: 1. Decreased enhancement of the right middle and right lower lobe pulmonary arteries, without definitive filling defect identified. Differential diagnosis would include decreased perfusion due to chronic consolidation versus pulmonary embolus. 2. Rounded masslike consolidation in the right lower lobe infrahilar region measuring up to 3.6 cm. The appearance is concerning for neoplasm, though underlying infection cannot be excluded. Pulmonology follow-up recommended. 3. Large right pleural effusion volume estimated in excess of 2 L. 4. Volume loss and peripheral consolidation within the right lower lobe, consistent with atelectasis. It is unclear whether this is postobstructive collapse or sys compressive atelectasis from the right pleural effusion. 5. Cardiomegaly, with prominent left ventricular dilatation. Small mural thrombi at the left ventricular apex best seen on the delayed contrast series on the corresponding CT abdomen exam. Echocardiography recommended. 6. Reflux of contrast into the hepatic veins consistent with cardiac dysfunction. 7.  Aortic Atherosclerosis (ICD10-I70.0). Abdomen/pelvis: 1. 5.9 cm infrarenal abdominal aortic aneurysm. No evidence of aortic leak or rupture. Recommend follow-up CT or MR as appropriate in 6 months and referral to or continued care with vascular specialist. (Ref.: J Vasc Surg. 2018; 67:2-77 and J Am Coll Radiol 2013;10(10):789-794.) 2. Moderate ascites. 3. Enlarged prostate, with evidence of chronic bladder outlet obstruction. 4.  Nonobstructing 2.1 cm bladder calculus. 5. Sigmoid diverticulosis without diverticulitis. 6.  Aortic Atherosclerosis (ICD10-I70.0). 7. Diffuse body wall edema. Critical Value/emergent results were called by telephone at the time of interpretation on 04/24/2023 at 550 pm to provider [facility] , who verbally acknowledged these results. Electronically Signed   By: Sharlet Salina M.D.   On: 04/24/2023 18:02   CT ABDOMEN PELVIS W CONTRAST Result Date: 04/24/2023 CLINICAL DATA:  Weight loss, fatigue, weakness, sepsis EXAM: CT ANGIOGRAPHY CHEST CT ABDOMEN AND PELVIS WITH CONTRAST TECHNIQUE: Multidetector CT imaging of the chest was performed using the standard protocol during bolus administration of intravenous contrast. Multiplanar CT image reconstructions and MIPs were obtained to evaluate the vascular anatomy. Multidetector CT imaging of the abdomen and pelvis was performed using the standard protocol during bolus administration of intravenous contrast. RADIATION DOSE REDUCTION: This exam was performed according to the departmental dose-optimization program which includes automated exposure control, adjustment of the mA and/or kV according to patient size and/or use of iterative reconstruction technique. CONTRAST:  75mL OMNIPAQUE IOHEXOL 350 MG/ML SOLN COMPARISON:  04/24/2023, 06/30/2020, 07/12/2016 FINDINGS: CTA CHEST FINDINGS Cardiovascular: There is technically adequate opacification of the main pulmonary artery. Mixing artifact is identified. There is decreased contrast enhancement of the right middle and right lower lobe pulmonary arteries, which could be due to pulmonary emboli versus decreased perfusion due to presumed underlying chronic consolidation. No other filling defects are identified. The heart is enlarged, with mild left ventricular dilatation. Mural thrombus is seen within the left ventricular apex, reference image 7 of series 9 on the abdominal CT exam. There is reflux of contrast into the  hepatic veins, suggesting an element of right sided cardiac dysfunction. No pericardial effusion. Normal caliber of the thoracic aorta. Evaluation of the aortic lumen is limited due to timing of the contrast bolus. Atherosclerosis of the aorta and coronary vasculature. Mediastinum/Nodes: There are numerous subcentimeter mediastinal and hilar lymph nodes, without evidence of pathologic adenopathy. Thyroid, trachea, and esophagus are grossly normal. Lungs/Pleura: There is a large right pleural effusion, volume estimated in excess of 2 L. There is rounded masslike consolidation of the right lower lobe in the infrahilar region, measuring up to 3.6 cm reference image 91/2. Neoplasm cannot be excluded.  Peripheral consolidation and volume loss elsewhere within the right lower lobe consistent with atelectasis. Indeterminate 4 mm left upper lobe pulmonary nodule reference image 19/3. Trace left pleural effusion. Upper lobe predominant emphysema.  No pneumothorax. Musculoskeletal: No acute or destructive bony abnormalities. Reconstructed images demonstrate no additional findings. Review of the MIP images confirms the above findings. CT ABDOMEN and PELVIS FINDINGS Hepatobiliary: No focal liver abnormality is seen. No gallstones, gallbladder wall thickening, or biliary dilatation. Pancreas: Unremarkable. No pancreatic ductal dilatation or surrounding inflammatory changes. Spleen: Normal in size without focal abnormality. Adrenals/Urinary Tract: Mild bilateral renal cortical atrophy and scattered areas of renal cortical scarring, right greater than left. No urinary tract calculi or obstructive uropathy. Bladder is moderately distended, with multiple bladder diverticula consistent with chronic bladder outlet obstruction. Nonobstructing 2.1 cm bladder calculus layers dependently. The adrenals are unremarkable. Stomach/Bowel: No bowel obstruction or ileus. Distal colonic diverticulosis without diverticulitis. No bowel wall  thickening or inflammatory change. Vascular/Lymphatic: There is a 5.9 cm infrarenal abdominal aortic aneurysm, with no evidence of leak or rupture. Chronic calcification is seen within the ventral aspect of the thrombosed portion of the aneurysm. Diffuse aortic atherosclerosis. No pathologic adenopathy. Reproductive: Marked enlargement of the prostate measuring 5.8 x 5.6 cm. Other: Moderate ascites, most pronounced within the bilateral flanks. No free intraperitoneal gas. No abdominal wall hernia. Musculoskeletal: Diffuse body wall edema. No acute or destructive bony abnormalities. Reconstructed images demonstrate no additional findings. Review of the MIP images confirms the above findings. IMPRESSION: Chest: 1. Decreased enhancement of the right middle and right lower lobe pulmonary arteries, without definitive filling defect identified. Differential diagnosis would include decreased perfusion due to chronic consolidation versus pulmonary embolus. 2. Rounded masslike consolidation in the right lower lobe infrahilar region measuring up to 3.6 cm. The appearance is concerning for neoplasm, though underlying infection cannot be excluded. Pulmonology follow-up recommended. 3. Large right pleural effusion volume estimated in excess of 2 L. 4. Volume loss and peripheral consolidation within the right lower lobe, consistent with atelectasis. It is unclear whether this is postobstructive collapse or sys compressive atelectasis from the right pleural effusion. 5. Cardiomegaly, with prominent left ventricular dilatation. Small mural thrombi at the left ventricular apex best seen on the delayed contrast series on the corresponding CT abdomen exam. Echocardiography recommended. 6. Reflux of contrast into the hepatic veins consistent with cardiac dysfunction. 7.  Aortic Atherosclerosis (ICD10-I70.0). Abdomen/pelvis: 1. 5.9 cm infrarenal abdominal aortic aneurysm. No evidence of aortic leak or rupture. Recommend follow-up CT or  MR as appropriate in 6 months and referral to or continued care with vascular specialist. (Ref.: J Vasc Surg. 2018; 67:2-77 and J Am Coll Radiol 2013;10(10):789-794.) 2. Moderate ascites. 3. Enlarged prostate, with evidence of chronic bladder outlet obstruction. 4. Nonobstructing 2.1 cm bladder calculus. 5. Sigmoid diverticulosis without diverticulitis. 6.  Aortic Atherosclerosis (ICD10-I70.0). 7. Diffuse body wall edema. Critical Value/emergent results were called by telephone at the time of interpretation on 04/24/2023 at 550 pm to provider [facility] , who verbally acknowledged these results. Electronically Signed   By: Sharlet Salina M.D.   On: 04/24/2023 18:02   CT Head Wo Contrast Result Date: 04/24/2023 CLINICAL DATA:  Losing weight, decreased energy for months. EXAM: CT HEAD WITHOUT CONTRAST TECHNIQUE: Contiguous axial images were obtained from the base of the skull through the vertex without intravenous contrast. RADIATION DOSE REDUCTION: This exam was performed according to the departmental dose-optimization program which includes automated exposure control, adjustment of the mA and/or kV according to patient  size and/or use of iterative reconstruction technique. COMPARISON:  CT head dated 07/24/2004. FINDINGS: Brain: No evidence of acute infarction, hemorrhage, hydrocephalus, extra-axial collection or mass lesion/mass effect. There is small to moderate volume encephalomalacia of the left frontal lobe superomedially and small-to-moderate volume encephalomalacia of the right occipital lobe. There is mild cerebral volume loss with associated ex vacuo dilatation. Periventricular Ohanian matter hypoattenuation likely represents chronic small vessel ischemic disease. Vascular: There are vascular calcifications in the carotid siphons. Skull: Normal. Negative for fracture or focal lesion. Sinuses/Orbits: No acute finding. Other: None. IMPRESSION: 1. No acute intracranial process. 2. Small to moderate volume  encephalomalacia of the left frontal lobe and right occipital lobe. Electronically Signed   By: Romona Curls M.D.   On: 04/24/2023 17:48   DG Chest 2 View Result Date: 04/24/2023 CLINICAL DATA:  Weakness. EXAM: CHEST - 2 VIEW COMPARISON:  07/12/2016. FINDINGS: Cardiac silhouette borderline enlarged. Left coronary artery stent. No mediastinal or hilar masses. No convincing adenopathy. Moderate right and small left pleural effusions. Additional opacity at the right lung base which may reflect atelectasis or pneumonia. Vascular congestion and bilateral interstitial thickening. Skeletal structures are grossly intact. IMPRESSION: 1. Findings consistent with congestive heart failure. Moderate right and small left pleural effusions. 2. Right lung base opacity may reflect atelectasis or pneumonia. Electronically Signed   By: Amie Portland M.D.   On: 04/24/2023 16:11    Microbiology: Results for orders placed or performed during the hospital encounter of 04/24/23  Resp panel by RT-PCR (RSV, Flu A&B, Covid) Anterior Nasal Swab     Status: None   Collection Time: 04/24/23  4:51 PM   Specimen: Anterior Nasal Swab  Result Value Ref Range Status   SARS Coronavirus 2 by RT PCR NEGATIVE NEGATIVE Final    Comment: (NOTE) SARS-CoV-2 target nucleic acids are NOT DETECTED.  The SARS-CoV-2 RNA is generally detectable in upper respiratory specimens during the acute phase of infection. The lowest concentration of SARS-CoV-2 viral copies this assay can detect is 138 copies/mL. A negative result does not preclude SARS-Cov-2 infection and should not be used as the sole basis for treatment or other patient management decisions. A negative result may occur with  improper specimen collection/handling, submission of specimen other than nasopharyngeal swab, presence of viral mutation(s) within the areas targeted by this assay, and inadequate number of viral copies(<138 copies/mL). A negative result must be combined  with clinical observations, patient history, and epidemiological information. The expected result is Negative.  Fact Sheet for Patients:  BloggerCourse.com  Fact Sheet for Healthcare Providers:  SeriousBroker.it  This test is no t yet approved or cleared by the [place] FDA and  has been authorized for detection and/or diagnosis of SARS-CoV-2 by FDA under an Emergency Use Authorization (EUA). This EUA will remain  in effect (meaning this test can be used) for the duration of the COVID-19 declaration under Section 564(b)(1) of the Act, 21 U.S.C.section 360bbb-3(b)(1), unless the authorization is terminated  or revoked sooner.       Influenza A by PCR NEGATIVE NEGATIVE Final   Influenza B by PCR NEGATIVE NEGATIVE Final    Comment: (NOTE) The Xpert Xpress SARS-CoV-2/FLU/RSV plus assay is intended as an aid in the diagnosis of influenza from Nasopharyngeal swab specimens and should not be used as a sole basis for treatment. Nasal washings and aspirates are unacceptable for Xpert Xpress SARS-CoV-2/FLU/RSV testing.  Fact Sheet for Patients: BloggerCourse.com  Fact Sheet for Healthcare Providers: SeriousBroker.it  This test is not  yet approved or cleared by the [place] and has been authorized for detection and/or diagnosis of SARS-CoV-2 by FDA under an Emergency Use Authorization (EUA). This EUA will remain in effect (meaning this test can be used) for the duration of the COVID-19 declaration under Section 564(b)(1) of the Act, 21 U.S.C. section 360bbb-3(b)(1), unless the authorization is terminated or revoked.     Resp Syncytial Virus by PCR NEGATIVE NEGATIVE Final    Comment: (NOTE) Fact Sheet for Patients: BloggerCourse.com  Fact Sheet for Healthcare Providers: SeriousBroker.it  This test is not yet approved  or cleared by the [place] FDA and has been authorized for detection and/or diagnosis of SARS-CoV-2 by FDA under an Emergency Use Authorization (EUA). This EUA will remain in effect (meaning this test can be used) for the duration of the COVID-19 declaration under Section 564(b)(1) of the Act, 21 U.S.C. section 360bbb-3(b)(1), unless the authorization is terminated or revoked.  Performed at [facility], [address] [place], [place] [address]   Blood culture (routine x 2)     Status: None   Collection Time: 04/24/23  6:46 PM   Specimen: BLOOD RIGHT ARM  Result Value Ref Range Status   Specimen Description BLOOD RIGHT ARM  Final   Special Requests   Final    BOTTLES DRAWN AEROBIC AND ANAEROBIC Blood Culture adequate volume   Culture   Final    NO GROWTH 5 DAYS Performed at [facility], [address], [place], [place] [address]    Report Status 04/29/2023 FINAL  Final  Blood culture (routine x 2)     Status: None   Collection Time: 04/24/23  6:46 PM   Specimen: BLOOD  Result Value Ref Range Status   Specimen Description BLOOD RIGHT ANTECUBITAL  Final   Special Requests   Final    BOTTLES DRAWN AEROBIC AND ANAEROBIC Blood Culture adequate volume   Culture   Final    NO GROWTH 5 DAYS Performed at [facility], [address], [place], [place] [address]    Report Status 04/29/2023 FINAL  Final  Body fluid culture w Gram Stain     Status: None   Collection Time: 04/25/23  4:28 PM   Specimen: PATH Cytology Pleural fluid  Result Value Ref Range Status   Specimen Description   Final    FLUID Performed at [facility], [address], [place], [place] [address]    Special Requests   Final    CYTO PLEU Performed at [facility], [address] [place], [place] [address]    Gram Stain   Final    CYTOSPIN SMEAR WBC PRESENT, PREDOMINANTLY MONONUCLEAR NO ORGANISMS SEEN    Culture   Final    NO GROWTH 3 DAYS Performed  at [facility], [address], [place], [place] [address]    Report Status 04/29/2023 FINAL  Final    Labs: CBC: Recent Labs  Lab 04/24/23 1524 04/25/23 0055 04/26/23 0427 04/27/23 0924 04/28/23 0424  WBC 5.3 5.1 5.3 5.2 5.3  HGB 16.6 15.1 14.1 14.6 14.6  HCT 52.8* 47.8 43.4 45.4 44.3  MCV 82.6 82.7 78.9* 79.1* 76.8*  PLT 97* 88* 88* 90* 99*   Basic Metabolic Panel: Recent Labs  Lab 04/24/23 1524 04/25/23 0055 04/26/23 0427 04/27/23 0924 04/28/23 0424 04/29/23 0551  NA 136 136 136 132* 129* 130*  K 4.1 4.1 3.5 3.3* 4.0 3.9  CL 102 100 98 94* 91* 90*  CO2 22 23 27 29 28 29   GLUCOSE 100* 96 76 82 91 96  BUN 32* 29* 29* 28* 27* 24*  CREATININE 1.55* 1.47* 1.55* 1.44* 1.33* 1.25*  CALCIUM 9.2 8.5* 8.6* 8.4* 8.3* 8.8*  MG 2.4  --   --  1.9 2.3  --    Liver Function Tests: Recent Labs  Lab 04/24/23 1524 04/25/23 0055  AST 29 26  ALT 22 22  ALKPHOS 95 87  BILITOT 3.5* 2.5*  PROT 7.1 6.0*  ALBUMIN 2.9* 2.6*   CBG: Recent Labs  Lab 04/24/23 1706 04/30/23 0903 04/30/23 1141 04/30/23 1653  GLUCAP 101* 118* 123* 91    Discharge time spent: greater than 30 minutes.  Signed: Enedina Finner, MD [facility] 04/30/2023

## 2023-04-30 NOTE — Consult Note (Signed)
Consultation Note Date: 04/30/2023   Patient Name: Dennis Church  DOB: November 19, 1955  MRN: [id number]  Age / Sex: 68 y.o., male  PCP: Patient, No Pcp Per Referring Physician: Enedina Finner, MD  Reason for Consultation: Establishing goals of care   HPI/Brief Hospital Course: 68 y.o. male  with past medical history of CIDM, CAD, CHF with EF less than 20%,  BPH, HTN, HLD and tobacco abuse admitted from home on 04/24/2023 with generalized weakness, frequent falls, weight loss and malaise.   Found to have acute respiratory failure and large pleural effusion with underlying mass. Mural thrombus noted on CT scan.  Underwent thoracentesis 1.9L off, cytology negative but concern remains for high suspicion for malignancy.  Acute CVA's as well as old infarcts noted on imaging  CT imaging also noted 5.9 cm AAA  Palliative medicine was consulted for assisting with goals of care conversations.  Subjective:  Extensive chart review has been completed prior to meeting patient including labs, vital signs, imaging, progress notes, orders, and available advanced directive documents from current and previous encounters.  Visited with Mr. Zilka earlier this AM. Safety sitter present. Mr. Strupp awake, alert, able to answer most orientation questions with some intermittent confusion. He is able to share he has a son that is planning to visit later today. He was able to eat the majority of his breakfast.  Introduced myself as a Publishing rights manager as a member of the palliative care team. Explained palliative medicine is specialized medical care for people living with serious illness. It focuses on providing relief from the symptoms and stress of a serious illness. The goal is to improve quality of life for both the patient and the family.   Mr. Joplin shares a brief life review. He is not currently married, he lives at home alone independently. He has a son-Miko that lives in [place] and a daughter-Miranda that lives in [place]. His son is most involved in caring for him but he does speak to his daughter on a routine basis. 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  Mr. Spagnoli denies acute pain or discomfort, reports feeling much better today.  Returned to bedside later in day with Dr. Allena Katz, we collectively met with son-Anchor and daughter in law-Faye at bedside.  Dr. Allena Katz provided medical updates and overview of hospitalization. She discussed the importance of close follow-up with specialists as well as establishing with PCP. Mr. Punch to need assistance such as home health and therapy in his home at discharge as he is likely to not qualify for STR.  Mr. Douthit and family able to voice their understanding of current medical conditions.  Smoking cessation was also addressed, encouraged Mr. Mccleave to continue cutting back on total cigarettes, also discussed complete cessation as he has been 5 days without a cigarette since being admitted.  We discussed patient's current illness and what it means in the larger context of patient's on-going co-morbidities. Natural disease trajectory and expectations at EOL were discussed.   Mr. Lutze shares he has not completed advanced directives in the past. He wishes to appoint his son and daughter as surrogate decision makers.  We discussed the role of outpatient palliative care for which Mr. Bolger and family agreed to.  I discussed importance of continued conversations with family/support persons and all members of their medical team regarding overall plan of care and treatment options ensuring decisions are in alignment with patients goals of care.  All questions/concerns addressed. Plan to discharge home today.  Objective: Primary Diagnoses: Present on Admission:  Coronary artery disease involving native coronary artery of native heart with angina pectoris (HCC)  Tobacco use disorder   Benign essential hypertension  Benign prostatic hyperplasia  Acute respiratory failure with hypoxia (HCC)  CKD stage 3a, GFR 45-59 ml/min (HCC)  Severe sepsis (HCC)  Pleural effusion on right  Mass of right lung  LV (left ventricular) mural thrombus  Acute stroke due to ischemia [facility])   Physical Exam Constitutional:      General: He is not in acute distress.    Appearance: He is ill-appearing.  Pulmonary:     Effort: Pulmonary effort is normal. No respiratory distress.  Skin:    General: Skin is warm and dry.  Neurological:     Mental Status: He is alert and oriented to person, place, and time.     Motor: Weakness present.     Vital Signs: BP 93/63 (BP Location: Right Arm)   Pulse 71   Temp (!) 97 F (36.1 C)   Resp 16   Ht 5\' 5"  (1.651 m)   Wt 57.1 kg   SpO2 95%   BMI 20.95 kg/m  Pain Scale: 0-10   Pain Score: 0-No pain   IO: Intake/output summary:  Intake/Output Summary (Last 24 hours) at 04/30/2023 1512 Last data filed at 04/29/2023 1600 Gross per 24 hour  Intake 240 ml  Output --  Net 240 ml    LBM: Last BM Date :  (PTA) Baseline Weight: Weight: 79.4 kg Most recent weight: Weight: 57.1 kg      Assessment and Plan  SUMMARY OF RECOMMENDATIONS   Order placed for [facility] to make referral to outpatient palliative care Plan for d/c today  Discussed With: Primary team   Thank you for this consult and allowing Palliative Medicine to participate in the care of ABIGAIL MARSIGLIA. Palliative medicine will continue to follow and assist as needed.   Time Total: 75 minutes  Time spent includes: Detailed review of medical records (labs, imaging, vital signs), medically appropriate exam (mental status, respiratory, cardiac, skin), discussed with treatment team, counseling and educating patient, family and staff, documenting clinical information, medication management and coordination of care.   Signed by: Leeanne Deed, DNP, AGNP-C Palliative Medicine    Please  contact Palliative Medicine Team phone at [phone] for questions and concerns.  For individual provider: See Loretha Stapler

## 2023-05-01 LAB — MULTIPLE MYELOMA PANEL, SERUM
Albumin SerPl Elph-Mcnc: 2.4 g/dL — ABNORMAL LOW (ref 2.9–4.4)
Albumin/Glob SerPl: 0.9 (ref 0.7–1.7)
Alpha 1: 0.3 g/dL (ref 0.0–0.4)
Alpha2 Glob SerPl Elph-Mcnc: 0.6 g/dL (ref 0.4–1.0)
B-Globulin SerPl Elph-Mcnc: 0.8 g/dL (ref 0.7–1.3)
Gamma Glob SerPl Elph-Mcnc: 1.4 g/dL (ref 0.4–1.8)
Globulin, Total: 3 g/dL (ref 2.2–3.9)
IgA: 265 mg/dL (ref 61–437)
IgG (Immunoglobin G), Serum: 1334 mg/dL (ref 603–1613)
IgM (Immunoglobulin M), Srm: 113 mg/dL (ref 20–172)
Total Protein ELP: 5.4 g/dL — ABNORMAL LOW (ref 6.0–8.5)

## 2023-05-09 ENCOUNTER — Inpatient Hospital Stay: Payer: Medicare Other

## 2023-05-09 ENCOUNTER — Inpatient Hospital Stay: Payer: Medicare Other | Admitting: Internal Medicine

## 2023-05-10 ENCOUNTER — Encounter: Payer: Self-pay | Admitting: Internal Medicine

## 2023-05-16 ENCOUNTER — Encounter: Payer: Self-pay | Admitting: *Deleted

## 2023-05-16 NOTE — Progress Notes (Signed)
 Per referral notes, pt declined oncology appt at this time. Referral coordinator sent message to referring provider and attempted to contact family to notify. Referral closed at this time. Pt has contact info if decides would like oncology appt in the future.

## 2023-12-16 ENCOUNTER — Emergency Department
Admission: EM | Admit: 2023-12-16 | Discharge: 2023-12-16 | Disposition: A | Discharge status: Home / Self Care | Attending: Emergency Medicine | Admitting: Emergency Medicine

## 2023-12-16 ENCOUNTER — Encounter: Payer: Self-pay | Admitting: Intensive Care

## 2023-12-16 ENCOUNTER — Emergency Department

## 2023-12-16 DIAGNOSIS — N390 Urinary tract infection, site not specified: Secondary | ICD-10-CM | POA: Insufficient documentation

## 2023-12-16 DIAGNOSIS — R339 Retention of urine, unspecified: Secondary | ICD-10-CM

## 2023-12-16 DIAGNOSIS — R109 Unspecified abdominal pain: Secondary | ICD-10-CM | POA: Diagnosis present

## 2023-12-16 DIAGNOSIS — I714 Abdominal aortic aneurysm, without rupture, unspecified: Secondary | ICD-10-CM

## 2023-12-16 LAB — URINALYSIS, ROUTINE W REFLEX MICROSCOPIC
Bilirubin Urine: NEGATIVE
Glucose, UA: NEGATIVE mg/dL
Ketones, ur: NEGATIVE mg/dL
Nitrite: POSITIVE — AB
Protein, ur: NEGATIVE mg/dL
Specific Gravity, Urine: 1.016 (ref 1.005–1.030)
Squamous Epithelial / HPF: 0 /HPF (ref 0–5)
WBC, UA: >50 WBC/hpf (ref 0–5)
pH: 5 (ref 5.0–8.0)

## 2023-12-16 LAB — CBC
HCT: 44.5 % (ref 39.0–52.0)
Hemoglobin: 14.8 g/dL (ref 13.0–17.0)
MCH: 29.1 pg (ref 26.0–34.0)
MCHC: 33.3 g/dL (ref 30.0–36.0)
MCV: 87.6 fL (ref 80.0–100.0)
Platelets: 336 K/uL (ref 150–400)
RBC: 5.08 MIL/uL (ref 4.22–5.81)
RDW: 14.3 % (ref 11.5–15.5)
WBC: 11.1 K/uL — ABNORMAL HIGH (ref 4.0–10.5)
nRBC: 0 % (ref 0.0–0.2)

## 2023-12-16 LAB — COMPREHENSIVE METABOLIC PANEL WITH GFR
ALT: 23 U/L (ref 0–44)
AST: 19 U/L (ref 15–41)
Albumin: 3.1 g/dL — ABNORMAL LOW (ref 3.5–5.0)
Alkaline Phosphatase: 105 U/L (ref 38–126)
Anion gap: 10 (ref 5–15)
BUN: 29 mg/dL — ABNORMAL HIGH (ref 8–23)
CO2: 21 mmol/L — ABNORMAL LOW (ref 22–32)
Calcium: 9.1 mg/dL (ref 8.9–10.3)
Chloride: 100 mmol/L (ref 98–111)
Creatinine, Ser: 1.72 mg/dL — ABNORMAL HIGH (ref 0.61–1.24)
GFR, Estimated: 42 mL/min — ABNORMAL LOW (ref >60)
Glucose, Bld: 105 mg/dL — ABNORMAL HIGH (ref 70–99)
Potassium: 4.2 mmol/L (ref 3.5–5.1)
Sodium: 131 mmol/L — ABNORMAL LOW (ref 135–145)
Total Bilirubin: 0.8 mg/dL (ref 0.0–1.2)
Total Protein: 7.8 g/dL (ref 6.5–8.1)

## 2023-12-16 LAB — LIPASE, BLOOD: Lipase: 32 U/L (ref 11–51)

## 2023-12-16 MED ORDER — CEFTRIAXONE SODIUM 1 G IJ SOLR
1.0000 g | Freq: Once | INTRAMUSCULAR | Status: AC
  Administered 2023-12-16: 1 g via INTRAVENOUS
  Filled 2023-12-16: qty 10

## 2023-12-16 MED ORDER — ACETAMINOPHEN 325 MG PO TABS
650.0000 mg | ORAL_TABLET | Freq: Once | ORAL | Status: AC
  Administered 2023-12-16: 650 mg via ORAL
  Filled 2023-12-16: qty 2

## 2023-12-16 MED ORDER — IOHEXOL 350 MG/ML SOLN
75.0000 mL | Freq: Once | INTRAVENOUS | Status: AC | PRN
  Administered 2023-12-16: 75 mL via INTRAVENOUS

## 2023-12-16 MED ORDER — SODIUM CHLORIDE 0.9 % IV BOLUS
1000.0000 mL | Freq: Once | INTRAVENOUS | Status: AC
  Administered 2023-12-16: 1000 mL via INTRAVENOUS

## 2023-12-16 MED ORDER — CEPHALEXIN 500 MG PO CAPS: 500.0000 mg | ORAL_CAPSULE | Freq: Three times a day (TID) | ORAL | 0 refills | Status: DC

## 2023-12-16 NOTE — Discharge Instructions (Addendum)
 We placed a Foley catheter today because you are having a hard time urinating.  This may be due to an infection or a blockage within the urinary tract.  Please take the antibiotics as prescribed.  This will treat the infection.  Please follow-up with the urologist as they will determine when the catheter can be removed and will help further evaluate why you are having difficulty urinating.   The CT of your abdomen showed that you have an abdominal aortic aneurysm.  Please schedule a follow-up appointment with the vascular doctor.  Return to the emergency department with any worsening symptoms.

## 2023-12-16 NOTE — ED Notes (Signed)
 Pt only able to urinate approximately 25 mL complains of constant lower abdominal pain and urethral pain. Spoke with PA about placing a foley catheter. Pt in agreement with plan of care.

## 2023-12-16 NOTE — ED Notes (Signed)
 PT in no acute distress prior to discharge. Discharged instructions reviewed and pt stated that they understand directions. Pt has all belongings with them at time of discharge.

## 2023-12-16 NOTE — ED Provider Notes (Signed)
 ----------------------------------------- 8:30 PM on 12/16/2023 -----------------------------------------  Blood pressure (!) 165/97, pulse 97, temperature 98.2 F (36.8 C), temperature source Oral, resp. rate 18, height 5' 7 (1.702 m), weight 72.6 kg, SpO2 97%.  Assuming care from Dr. Floy.  In short, Dennis Church is a 68 y.o. male with a chief complaint of Abdominal Pain .  Refer to the original H&P for additional details.  The current plan of care is to wait and see if patient is able to urinate on his own, if not will place foley catheter and have patient follow up with urology.  ____________________________________________    ED Results / Procedures / Treatments   Labs (all labs ordered are listed, but only abnormal results are displayed) Labs Reviewed  COMPREHENSIVE METABOLIC PANEL WITH GFR - Abnormal; Notable for the following components:      Result Value   Sodium 131 (*)    CO2 21 (*)    Glucose, Bld 105 (*)    BUN 29 (*)    Creatinine, Ser 1.72 (*)    Albumin 3.1 (*)    GFR, Estimated 42 (*)    All other components within normal limits  CBC - Abnormal; Notable for the following components:   WBC 11.1 (*)    All other components within normal limits  URINALYSIS, ROUTINE W REFLEX MICROSCOPIC - Abnormal; Notable for the following components:   Color, Urine YELLOW (*)    APPearance CLOUDY (*)    Hgb urine dipstick SMALL (*)    Nitrite POSITIVE (*)    Leukocytes,Ua LARGE (*)    Bacteria, UA MANY (*)    All other components within normal limits  URINE CULTURE  LIPASE, BLOOD     PROCEDURES:  Critical Care performed: No  Procedures   MEDICATIONS ORDERED IN ED: Medications  sodium chloride  0.9 % bolus 1,000 mL (0 mLs Intravenous Stopped 12/16/23 1621)  iohexol  (OMNIPAQUE ) 350 MG/ML injection 75 mL (75 mLs Intravenous Contrast Given 12/16/23 1441)  cefTRIAXone  (ROCEPHIN ) 1 g in sodium chloride  0.9 % 100 mL IVPB (0 g Intravenous Stopped 12/16/23 1730)   acetaminophen  (TYLENOL ) tablet 650 mg (650 mg Oral Given 12/16/23 1933)     IMPRESSION / MDM / ASSESSMENT AND PLAN / ED COURSE  I reviewed the triage vital signs and the nursing notes.                              Patient's presentation is most consistent with acute complicated illness / injury requiring diagnostic workup.  Patient was unable to urinate after receiving IV fluids.  Bladder scan showed he was retaining, so Foley catheter was placed.  Nurse reported about 650 mL of urine output.  Patient's diagnosis is consistent with urinary retention, UTI and AAA. Patient will be discharged home with prescriptions for antibiotics. Patient is to follow up with urology and vascular as needed or otherwise directed. Patient is given ED precautions to return to the ED for any worsening or new symptoms.     FINAL CLINICAL IMPRESSION(S) / ED DIAGNOSES   Final diagnoses:  Lower urinary tract infectious disease  Abdominal aortic aneurysm (AAA) without rupture, unspecified part (HCC)     Rx / DC Orders   ED Discharge Orders          Ordered    cephALEXin  (KEFLEX ) 500 MG capsule  3 times daily        12/16/23 1848  Note:  This document was prepared using Dragon voice recognition software and may include unintentional dictation errors.    Cleaster Tinnie LABOR, PA-C 12/16/23 2044    Dorothyann Drivers, MD 12/16/23 2249

## 2023-12-16 NOTE — ED Notes (Signed)
 16 fr Coude foley catheter placed. Pt tolerated without difficulty. Foley care tips discussed. Pt stated he understood and knows since he has had foley's in the past.

## 2023-12-16 NOTE — ED Provider Notes (Signed)
 [facility] Provider Note    Event Date/Time   First MD Initiated Contact with Patient 12/16/23 1233     (approximate)   History   Abdominal Pain   HPI  Dennis Church is a 68 y.o. male who presents to the emergency department today with complaints of abdominal pain and difficulty with urination.  Symptoms started 2 days ago.  He noticed he started having a harder time urinating.  He would just dribble out.  He then started having left sided flank pain.  The patient does think he saw some puslike urine.  He denies any fevers.  Denies any nausea.  Per chart review the patient has history of AAA.     Physical Exam   Triage Vital Signs: ED Triage Vitals  Encounter Vitals Group     BP 12/16/23 1224 (!) 172/94     Girls Systolic BP Percentile --      Girls Diastolic BP Percentile --      Boys Systolic BP Percentile --      Boys Diastolic BP Percentile --      Pulse Rate 12/16/23 1224 (!) 101     Resp 12/16/23 1224 18     Temp 12/16/23 1225 98 F (36.7 C)     Temp Source 12/16/23 1225 Oral     SpO2 12/16/23 1224 96 %     Weight 12/16/23 1223 160 lb (72.6 kg)     Height 12/16/23 1223 5' 7 (1.702 m)     Head Circumference --      Peak Flow --      Pain Score 12/16/23 1222 3     Pain Loc --      Pain Education --      Exclude from Growth Chart --     Most recent vital signs: Vitals:   12/16/23 1224 12/16/23 1225  BP: (!) 172/94   Pulse: (!) 101   Resp: 18   Temp:  98 F (36.7 C)  SpO2: 96%    General: Awake, alert, oriented. CV:  Good peripheral perfusion. Regular rate and rhythm. Resp:  Normal effort. Lungs clear. Abd:  No distention. Non tender.  ED Results / Procedures / Treatments   Labs (all labs ordered are listed, but only abnormal results are displayed) Labs Reviewed  COMPREHENSIVE METABOLIC PANEL WITH GFR - Abnormal; Notable for the following components:      Result Value   Sodium 131 (*)    CO2 21 (*)    Glucose, Bld 105  (*)    BUN 29 (*)    Creatinine, Ser 1.72 (*)    Albumin 3.1 (*)    GFR, Estimated 42 (*)    All other components within normal limits  CBC - Abnormal; Notable for the following components:   WBC 11.1 (*)    All other components within normal limits  URINALYSIS, ROUTINE W REFLEX MICROSCOPIC - Abnormal; Notable for the following components:   Color, Urine YELLOW (*)    APPearance CLOUDY (*)    Hgb urine dipstick SMALL (*)    Nitrite POSITIVE (*)    Leukocytes,Ua LARGE (*)    Bacteria, UA MANY (*)    All other components within normal limits  URINE CULTURE  LIPASE, BLOOD     EKG  None   RADIOLOGY I independently interpreted and visualized the CTA dissection. My interpretation: No dissection, large AAA Radiology interpretation:  IMPRESSION:  1. Similar size of juxtarenal abdominal aortic aneurysm measuring  up  to 5.9 cm. No evidence of aneurysm rupture or retroperitoneal  hematoma. Recommend follow-up CT or MR as appropriate in 6 months  and referral to or continued care with vascular specialist. (Ref.: J  Vasc Surg. 2018; 67:2-77 and J Am Coll Radiol 2013;10(10):789-794.)    2.  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    3. 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    4. Occluded left internal iliac artery. Likely severe stenosis in  the right internal iliac artery.    5. 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    6. Prostatomegaly with findings suggestive of bladder outlet  obstruction with unchanged bladder stone. No hydronephrosis.    PROCEDURES:  Critical Care performed: No    MEDICATIONS ORDERED IN ED: Medications - No data to display   IMPRESSION / MDM / ASSESSMENT AND PLAN / ED COURSE  I reviewed the triage vital signs and the nursing notes.                              Differential diagnosis includes, but is not limited to,  urinary retention, UTI, kidney stone  Patient's presentation is most consistent with acute presentation with potential threat to life or bodily function.  Patient presented to the emergency department today because of concerns for difficulty with urination and some left sided abdominal pain.  Initial bladder scan with out evidence of urinary retention.  Blood work does show slight AKI.  Patient was given IV fluids.  Did also obtain a CT scan of the abdomen given history of AAA.  This did not show any concerning signs for dissection.  I discussed this with the patient and discussed importance of following up with vascular surgery.  Urine is concerning for infection.  Will give dose of IV antibiotics here in the emergency department.  Will give patient further IV fluids to see if he is able to urinate on his own.  Will sign out with plan to place Foley if patient is unable to urinate after further IV fluids.     FINAL CLINICAL IMPRESSION(S) / ED DIAGNOSES   Final diagnoses:  Lower urinary tract infectious disease     Note:  This document was prepared using Dragon voice recognition software and may include unintentional dictation errors.    [name], MD 12/16/23 [phone]

## 2023-12-16 NOTE — ED Triage Notes (Signed)
 Patient c/o being unable to void X2 days. Reports dribbling. C/o left lower abdominal/side pain

## 2023-12-18 LAB — URINE CULTURE: Culture: >=100000 — AB

## 2023-12-19 ENCOUNTER — Telehealth: Payer: Self-pay

## 2023-12-19 NOTE — Telephone Encounter (Signed)
 Called pt back after discussing with Sam PA and we scheduled him for a F/U from the ER to evaluate him and possibly talk about procedures for his enlarged prostrate.

## 2023-12-21 ENCOUNTER — Ambulatory Visit: Admitting: Urology

## 2023-12-21 VITALS — BP 158/99 | HR 71 | Ht 69.0 in | Wt 170.0 lb

## 2023-12-21 DIAGNOSIS — N401 Enlarged prostate with lower urinary tract symptoms: Secondary | ICD-10-CM | POA: Diagnosis not present

## 2023-12-21 DIAGNOSIS — R972 Elevated prostate specific antigen [PSA]: Secondary | ICD-10-CM

## 2023-12-21 DIAGNOSIS — R338 Other retention of urine: Secondary | ICD-10-CM | POA: Diagnosis not present

## 2023-12-21 MED ORDER — TAMSULOSIN HCL 0.4 MG PO CAPS: 0.4000 mg | ORAL_CAPSULE | Freq: Every day | ORAL | 11 refills | Status: DC

## 2023-12-21 NOTE — Progress Notes (Signed)
 Catheter Removal  Patient is present today for a catheter removal.  9ml of water was drained from the balloon. A 16FR foley cath was removed from the bladder, no complications were noted. Patient tolerated well.  Performed by: Laymon Ned, CMA  Follow up/ Additional notes: No follow-ups on file.

## 2023-12-21 NOTE — Progress Notes (Signed)
 12/21/2023 11:11 AM   Christopher LITTIE Pizza 08/29/1955 [id number]  Referring provider: No referring provider defined for this encounter.  Chief Complaint  Patient presents with   Results    HPI: Dennis Church is a 68 y.o. male with a history of BPH who scheduled appointment to discuss surgical options for urinary retention.  I initially saw him 08/19/2021 for UTI, urinary retention and BPH.  PVR was >1 L and Foley catheter was placed.  He was given Rx tamsulosin  and Bactrim .  CT March 2022 showed a 1.7 cm bladder calculus and prostate volume was estimated at 82 cc.  Cystolitholapaxy and HoLEP were discussed.  He canceled a follow-up appointment. Presented to [facility] ED 12/16/2023 complaining of abdominal pain and voiding difficulty.  CT showed prostate enlargement and a 2 cm bladder calculus.  UA was consistent with infection.  Foley catheter was placed.  And he was discharged on oral antibiotics.  Urine culture grew >100 K Klebsiella.  He received 1 g ceftriaxone  in the ED.  Organism was sensitive to cephalexin  Today he states he is not interested in outlet procedures at this time and he is requesting his catheter be removed. He only took tamsulosin  for 30 days at his visit in 2023 and has not been on prostate medication since that time   PMH: Past Medical History:  Diagnosis Date   Anginal pain (HCC)    Coronary artery disease    Hyperlipidemia    Hypertension    Myocardial infarction [facility])    Renal disorder    kidney stones    Surgical History: Past Surgical History:  Procedure Laterality Date   CARDIAC CATHETERIZATION Left 04/25/2015   Procedure: Left Heart Cath and Coronary Angiography;  Surgeon: Vinie DELENA Jude, MD;  Location: [facility] INVASIVE CV LAB;  Service: Cardiovascular;  Laterality: Left;   CAROTID ENDARTERECTOMY Left    CORONARY ANGIOPLASTY WITH STENT PLACEMENT     LITHOTRIPSY      Home Medications:  Allergies as of 12/21/2023   No Known Allergies      Medication List         Accurate as of December 21, 2023 11:11 AM. If you have any questions, ask your nurse or doctor.          apixaban  5 MG Tabs tablet Commonly known as: ELIQUIS  Take 2 tablets (10 mg total) by mouth 2 (two) times daily for 2 days, THEN 1 tablet (5 mg total) 2 (two) times daily. Start taking on: April 30, 2023   atorvastatin  80 MG tablet Commonly known as: LIPITOR Take 80 mg by mouth every evening.   cephALEXin  500 MG capsule Commonly known as: KEFLEX  Take 1 capsule (500 mg total) by mouth 3 (three) times daily.   feeding supplement Liqd Take 237 mLs by mouth 2 (two) times daily between meals.   isosorbide  mononitrate 60 MG 24 hr tablet Commonly known as: IMDUR  Take 60 mg by mouth daily.   losartan  50 MG tablet Commonly known as: COZAAR  Take 1 tablet (50 mg total) by mouth daily.   metoprolol  succinate 25 MG 24 hr tablet Commonly known as: TOPROL -XL Take 1 tablet (25 mg total) by mouth daily.   nicotine  21 mg/24hr patch Commonly known as: NICODERM CQ  - dosed in mg/24 hours Place 1 patch (21 mg total) onto the skin daily.   nitroGLYCERIN  0.4 MG SL tablet Commonly known as: NITROSTAT  Place 0.4 mg under the tongue every 5 (five) minutes x 3 doses as needed.  spironolactone  25 MG tablet Commonly known as: ALDACTONE  Take 1 tablet (25 mg total) by mouth daily.   tamsulosin  0.4 MG Caps capsule Commonly known as: FLOMAX  Take 1 capsule (0.4 mg total) by mouth daily.   torsemide  20 MG tablet Commonly known as: DEMADEX  Take 1 tablet (20 mg total) by mouth daily.        Allergies: No Known Allergies  Family History: No family history on file.  Social History:  reports that he has been smoking cigarettes. He has never used smokeless tobacco. He reports that he does not currently use drugs. He reports that he does not drink alcohol.   Physical Exam: BP (!) 158/99   Pulse 71   Ht 5' 9 (1.753 m)   Wt 170 lb (77.1 kg)   BMI 25.10 kg/m    Constitutional:  Alert, No acute distress. HEENT: [place] AT Respiratory: Normal respiratory effort, no increased work of breathing. GU: Foley catheter draining clear urine Psychiatric: Normal mood and affect.   Pertinent Imaging: CT images 12/16/2023 were personally reviewed and interpreted.  Prostate volume calculated at ~115 cc    Assessment & Plan:    1.  BPH with urinary retention Has not been on an alpha-blocker in over 2 years and only took for 30 days I recommended starting tamsulosin  and follow-up appointment for cath removal/voiding trial 12/26/2023 He declined and requested catheter removal against my advice I discussed cystolitholapaxy and HoLEP which I felt would be his best surgical options.  He states he knows he will eventually need surgery but not yet ready to schedule His catheter was removed and he was instructed to call if he is unable to void Rx tamsulosin  sent to pharmacy PA follow-up recommended 1 month with PVR and will schedule  2.  Elevated PSA PSA in 2023 was 11.0 with prior PSA 7.2.  A repeat PSA 04/25/2023 was 7.24 Recommend further evaluation on follow-up   Glendia JAYSON Barba, MD  [facility] [address], [address] [place], [place] [address] [phone]

## 2023-12-26 ENCOUNTER — Encounter: Payer: Self-pay | Admitting: Urology

## 2024-01-20 ENCOUNTER — Ambulatory Visit: Admitting: Urology

## 2024-01-20 DIAGNOSIS — N401 Enlarged prostate with lower urinary tract symptoms: Secondary | ICD-10-CM

## 2024-02-20 ENCOUNTER — Other Ambulatory Visit: Payer: Self-pay

## 2024-02-20 ENCOUNTER — Inpatient Hospital Stay
Admission: EM | Admit: 2024-02-20 | Discharge: 2024-02-25 | DRG: 871 | Disposition: A | Discharge status: Home Health | Attending: Student | Admitting: Student

## 2024-02-20 DIAGNOSIS — I714 Abdominal aortic aneurysm, without rupture, unspecified: Secondary | ICD-10-CM | POA: Diagnosis present

## 2024-02-20 DIAGNOSIS — E785 Hyperlipidemia, unspecified: Secondary | ICD-10-CM | POA: Diagnosis present

## 2024-02-20 DIAGNOSIS — N390 Urinary tract infection, site not specified: Secondary | ICD-10-CM | POA: Diagnosis present

## 2024-02-20 DIAGNOSIS — N289 Disorder of kidney and ureter, unspecified: Secondary | ICD-10-CM | POA: Diagnosis not present

## 2024-02-20 DIAGNOSIS — N179 Acute kidney failure, unspecified: Secondary | ICD-10-CM | POA: Diagnosis present

## 2024-02-20 DIAGNOSIS — A4151 Sepsis due to Escherichia coli [E. coli]: Secondary | ICD-10-CM | POA: Diagnosis present

## 2024-02-20 DIAGNOSIS — B9620 Unspecified Escherichia coli [E. coli] as the cause of diseases classified elsewhere: Secondary | ICD-10-CM | POA: Diagnosis present

## 2024-02-20 DIAGNOSIS — B961 Klebsiella pneumoniae [K. pneumoniae] as the cause of diseases classified elsewhere: Secondary | ICD-10-CM | POA: Diagnosis present

## 2024-02-20 DIAGNOSIS — F17210 Nicotine dependence, cigarettes, uncomplicated: Secondary | ICD-10-CM | POA: Diagnosis present

## 2024-02-20 DIAGNOSIS — I252 Old myocardial infarction: Secondary | ICD-10-CM

## 2024-02-20 DIAGNOSIS — Z6820 Body mass index (BMI) 20.0-20.9, adult: Secondary | ICD-10-CM | POA: Diagnosis not present

## 2024-02-20 DIAGNOSIS — Z7901 Long term (current) use of anticoagulants: Secondary | ICD-10-CM | POA: Diagnosis not present

## 2024-02-20 DIAGNOSIS — N1831 Chronic kidney disease, stage 3a: Secondary | ICD-10-CM | POA: Diagnosis present

## 2024-02-20 DIAGNOSIS — I2510 Atherosclerotic heart disease of native coronary artery without angina pectoris: Secondary | ICD-10-CM | POA: Diagnosis present

## 2024-02-20 DIAGNOSIS — E43 Unspecified severe protein-calorie malnutrition: Secondary | ICD-10-CM | POA: Diagnosis present

## 2024-02-20 DIAGNOSIS — D509 Iron deficiency anemia, unspecified: Secondary | ICD-10-CM | POA: Diagnosis present

## 2024-02-20 DIAGNOSIS — I5022 Chronic systolic (congestive) heart failure: Secondary | ICD-10-CM | POA: Diagnosis present

## 2024-02-20 DIAGNOSIS — E871 Hypo-osmolality and hyponatremia: Secondary | ICD-10-CM | POA: Diagnosis present

## 2024-02-20 DIAGNOSIS — E538 Deficiency of other specified B group vitamins: Secondary | ICD-10-CM | POA: Diagnosis present

## 2024-02-20 DIAGNOSIS — R6520 Severe sepsis without septic shock: Secondary | ICD-10-CM | POA: Diagnosis present

## 2024-02-20 DIAGNOSIS — Z79899 Other long term (current) drug therapy: Secondary | ICD-10-CM | POA: Diagnosis not present

## 2024-02-20 DIAGNOSIS — R339 Retention of urine, unspecified: Principal | ICD-10-CM

## 2024-02-20 DIAGNOSIS — Z955 Presence of coronary angioplasty implant and graft: Secondary | ICD-10-CM

## 2024-02-20 DIAGNOSIS — A419 Sepsis, unspecified organism: Secondary | ICD-10-CM | POA: Diagnosis present

## 2024-02-20 DIAGNOSIS — K5900 Constipation, unspecified: Secondary | ICD-10-CM | POA: Diagnosis present

## 2024-02-20 DIAGNOSIS — I10 Essential (primary) hypertension: Secondary | ICD-10-CM | POA: Diagnosis present

## 2024-02-20 DIAGNOSIS — R338 Other retention of urine: Secondary | ICD-10-CM | POA: Diagnosis present

## 2024-02-20 DIAGNOSIS — I739 Peripheral vascular disease, unspecified: Secondary | ICD-10-CM | POA: Diagnosis present

## 2024-02-20 DIAGNOSIS — N323 Diverticulum of bladder: Secondary | ICD-10-CM | POA: Diagnosis present

## 2024-02-20 DIAGNOSIS — N401 Enlarged prostate with lower urinary tract symptoms: Secondary | ICD-10-CM | POA: Diagnosis present

## 2024-02-20 DIAGNOSIS — N210 Calculus in bladder: Secondary | ICD-10-CM | POA: Diagnosis present

## 2024-02-20 DIAGNOSIS — Z8249 Family history of ischemic heart disease and other diseases of the circulatory system: Secondary | ICD-10-CM

## 2024-02-20 DIAGNOSIS — Z8679 Personal history of other diseases of the circulatory system: Secondary | ICD-10-CM

## 2024-02-20 DIAGNOSIS — I130 Hypertensive heart and chronic kidney disease with heart failure and stage 1 through stage 4 chronic kidney disease, or unspecified chronic kidney disease: Secondary | ICD-10-CM | POA: Diagnosis present

## 2024-02-20 DIAGNOSIS — Z91148 Patient's other noncompliance with medication regimen for other reason: Secondary | ICD-10-CM

## 2024-02-20 LAB — COMPREHENSIVE METABOLIC PANEL WITH GFR
ALT: 9 U/L (ref 0–44)
AST: 15 U/L (ref 15–41)
Albumin: 3.8 g/dL (ref 3.5–5.0)
Alkaline Phosphatase: 120 U/L (ref 38–126)
Anion gap: 12 (ref 5–15)
BUN: 35 mg/dL — ABNORMAL HIGH (ref 8–23)
CO2: 22 mmol/L (ref 22–32)
Calcium: 9.9 mg/dL (ref 8.9–10.3)
Chloride: 97 mmol/L — ABNORMAL LOW (ref 98–111)
Creatinine, Ser: 2.32 mg/dL — ABNORMAL HIGH (ref 0.61–1.24)
GFR, Estimated: 29 mL/min — ABNORMAL LOW (ref >60)
Glucose, Bld: 140 mg/dL — ABNORMAL HIGH (ref 70–99)
Potassium: 4.5 mmol/L (ref 3.5–5.1)
Sodium: 130 mmol/L — ABNORMAL LOW (ref 135–145)
Total Bilirubin: 0.8 mg/dL (ref 0.0–1.2)
Total Protein: 7.6 g/dL (ref 6.5–8.1)

## 2024-02-20 LAB — URINALYSIS, W/ REFLEX TO CULTURE (INFECTION SUSPECTED)
Bacteria, UA: NONE SEEN
Bilirubin Urine: NEGATIVE
Glucose, UA: NEGATIVE mg/dL
Ketones, ur: NEGATIVE mg/dL
Nitrite: POSITIVE — AB
Protein, ur: 100 mg/dL — AB
RBC / HPF: >50 RBC/hpf (ref 0–5)
Specific Gravity, Urine: 1.012 (ref 1.005–1.030)
Squamous Epithelial / HPF: 0 /HPF (ref 0–5)
WBC, UA: >50 WBC/hpf (ref 0–5)
pH: 6 (ref 5.0–8.0)

## 2024-02-20 LAB — CBC
HCT: 44.3 % (ref 39.0–52.0)
Hemoglobin: 14.8 g/dL (ref 13.0–17.0)
MCH: 29.7 pg (ref 26.0–34.0)
MCHC: 33.4 g/dL (ref 30.0–36.0)
MCV: 88.8 fL (ref 80.0–100.0)
Platelets: 251 K/uL (ref 150–400)
RBC: 4.99 MIL/uL (ref 4.22–5.81)
RDW: 16.3 % — ABNORMAL HIGH (ref 11.5–15.5)
WBC: 10.4 K/uL (ref 4.0–10.5)
nRBC: 0 % (ref 0.0–0.2)

## 2024-02-20 LAB — MAGNESIUM: Magnesium: 1.9 mg/dL (ref 1.7–2.4)

## 2024-02-20 MED ORDER — SODIUM CHLORIDE 0.9 % IV SOLN
1.0000 g | Freq: Once | INTRAVENOUS | Status: AC
  Administered 2024-02-20: 1 g via INTRAVENOUS
  Filled 2024-02-20: qty 10

## 2024-02-20 MED ORDER — ONDANSETRON HCL 4 MG/2ML IJ SOLN: 4.0000 mg | Freq: Four times a day (QID) | INTRAMUSCULAR | Status: DC | PRN

## 2024-02-20 MED ORDER — HYDRALAZINE HCL 20 MG/ML IJ SOLN: 5.0000 mg | Freq: Four times a day (QID) | INTRAMUSCULAR | Status: DC | PRN

## 2024-02-20 MED ORDER — ACETAMINOPHEN 650 MG RE SUPP
650.0000 mg | Freq: Four times a day (QID) | RECTAL | Status: DC | PRN
Start: 2024-02-20 — End: 2024-02-25

## 2024-02-20 MED ORDER — NICOTINE 21 MG/24HR TD PT24
21.0000 mg | MEDICATED_PATCH | Freq: Every day | TRANSDERMAL | Status: DC
  Administered 2024-02-20: 21 mg via TRANSDERMAL
  Filled 2024-02-20 (×4): qty 1

## 2024-02-20 MED ORDER — ATORVASTATIN CALCIUM 20 MG PO TABS: 80.0000 mg | ORAL_TABLET | Freq: Every evening | ORAL | Status: DC

## 2024-02-20 MED ORDER — TAMSULOSIN HCL 0.4 MG PO CAPS
0.4000 mg | ORAL_CAPSULE | Freq: Every day | ORAL | Status: DC
  Administered 2024-02-20 – 2024-02-25 (×6): 0.4 mg via ORAL
  Filled 2024-02-20 (×6): qty 1

## 2024-02-20 MED ORDER — SODIUM CHLORIDE 0.9 % IV BOLUS
500.0000 mL | Freq: Once | INTRAVENOUS | Status: AC
  Administered 2024-02-20: 500 mL via INTRAVENOUS

## 2024-02-20 MED ORDER — APIXABAN 5 MG PO TABS: 5.0000 mg | ORAL_TABLET | Freq: Two times a day (BID) | ORAL | Status: DC

## 2024-02-20 MED ORDER — SODIUM CHLORIDE 0.9 % IV SOLN
2.0000 g | INTRAVENOUS | Status: DC
  Administered 2024-02-21 – 2024-02-23 (×3): 2 g via INTRAVENOUS
  Filled 2024-02-20 (×3): qty 20

## 2024-02-20 MED ORDER — ENSURE ENLIVE PO LIQD
237.0000 mL | Freq: Two times a day (BID) | ORAL | Status: DC
  Administered 2024-02-21 – 2024-02-23 (×6): 237 mL via ORAL
  Filled 2024-02-20: qty 237

## 2024-02-20 MED ORDER — ONDANSETRON HCL 4 MG PO TABS: 4.0000 mg | ORAL_TABLET | Freq: Four times a day (QID) | ORAL | Status: DC | PRN

## 2024-02-20 MED ORDER — METOPROLOL SUCCINATE ER 50 MG PO TB24: 25.0000 mg | ORAL_TABLET | Freq: Every day | ORAL | Status: DC

## 2024-02-20 MED ORDER — SODIUM CHLORIDE 0.9 % IV SOLN: INTRAVENOUS | Status: AC

## 2024-02-20 MED ORDER — METOPROLOL TARTRATE 25 MG PO TABS
12.5000 mg | ORAL_TABLET | Freq: Two times a day (BID) | ORAL | Status: DC
  Administered 2024-02-20 – 2024-02-25 (×10): 12.5 mg via ORAL
  Filled 2024-02-20 (×10): qty 1

## 2024-02-20 MED ORDER — ASPIRIN 81 MG PO TBEC
81.0000 mg | DELAYED_RELEASE_TABLET | Freq: Every day | ORAL | Status: DC
  Administered 2024-02-20 – 2024-02-25 (×6): 81 mg via ORAL
  Filled 2024-02-20 (×6): qty 1

## 2024-02-20 MED ORDER — ISOSORBIDE MONONITRATE ER 60 MG PO TB24: 60.0000 mg | ORAL_TABLET | Freq: Every day | ORAL | Status: DC

## 2024-02-20 MED ORDER — ACETAMINOPHEN 325 MG PO TABS
650.0000 mg | ORAL_TABLET | Freq: Four times a day (QID) | ORAL | Status: DC | PRN
  Administered 2024-02-20: 650 mg via ORAL
  Filled 2024-02-20: qty 2

## 2024-02-20 NOTE — H&P (Addendum)
 History and Physical    Dennis Church FMW:[id number] DOB: 04-18-1955 DOA: 02/20/2024  PCP: Patient, No Pcp Per (Confirm with patient/family/NH records and if not entered, this has to be entered at [facility] point of entry) Patient coming from: Home  I have personally briefly reviewed patient's old medical records in [facility]  Chief Complaint: Trouble to urinate again  HPI: Dennis Church is a 68 y.o. male with medical history significant of BPH, with recurrent urinary retention, HTN, chronic HFrEF, LV mural thrombosis, AAA, multivessel CAD, CKD stage IIIa, cigarette smoking, presented with worsening of urinary retention.  Patient has a history of severe BPH who was recently started on Flomax  by urology.  This year alone he has had several occasions of severe urinary retention including one-time had to come to ED and a Foley was put in emergently.  Patient was last seen by urology in September, will need urology proposed surgical solution, but patient declined offer.  This time, his symptoms started 2 days ago, patient suddenly developed difficulty urinating, he had to manually push lower abdomen however only drops of urine coming out.  Denied any fever chills or back pain.  ED Course: Afebrile, tachycardia heart rate 130, blood pressure 140/100 O2 saturation 100% on room air.  A Foley placed in the ED drained more than 1000 mL of cloudy urine, UA showed 3+ RBC and 3+ WBC.  Blood work showed hemoglobin 14.8 hemoglobin 10.4, BUN 35 creatinine 2.3 compared to baseline 1.2-1.5 sodium 130 potassium 4.5.    Patient was started on IV fluid and ceftriaxone  in the ED.  Review of Systems: As per HPI otherwise 14 point review of systems negative.    Past Medical History:  Diagnosis Date   Anginal pain    Coronary artery disease    Hyperlipidemia    Hypertension    Myocardial infarction [facility])    Renal disorder    kidney stones    Past Surgical History:  Procedure Laterality Date   CARDIAC  CATHETERIZATION Left 04/25/2015   Procedure: Left Heart Cath and Coronary Angiography;  Surgeon: Vinie DELENA Jude, MD;  Location: [facility] INVASIVE CV LAB;  Service: Cardiovascular;  Laterality: Left;   CAROTID ENDARTERECTOMY Left    CORONARY ANGIOPLASTY WITH STENT PLACEMENT     LITHOTRIPSY       reports that he has been smoking cigarettes. He has never used smokeless tobacco. He reports that he does not currently use drugs. He reports that he does not drink alcohol.  No Known Allergies  History reviewed. No pertinent family history.   Prior to Admission medications   Medication Sig Start Date End Date Taking? Authorizing Provider  apixaban  (ELIQUIS ) 5 MG TABS tablet Take 2 tablets (10 mg total) by mouth 2 (two) times daily for 2 days, THEN 1 tablet (5 mg total) 2 (two) times daily. 04/30/23 12/21/23  Patel, Sona, MD  atorvastatin  (LIPITOR) 80 MG tablet Take 80 mg by mouth every evening.    [provider]  cephALEXin  (KEFLEX ) 500 MG capsule Take 1 capsule (500 mg total) by mouth 3 (three) times daily. 12/16/23   [name], MD  feeding supplement (ENSURE ENLIVE / ENSURE PLUS) LIQD Take 237 mLs by mouth 2 (two) times daily between meals. 05/01/23   Patel, Sona, MD  isosorbide  mononitrate (IMDUR ) 60 MG 24 hr tablet Take 60 mg by mouth daily.    [provider]  losartan  (COZAAR ) 50 MG tablet Take 1 tablet (50 mg total) by mouth daily.  05/01/23   Patel, Sona, MD  metoprolol  succinate (TOPROL -XL) 25 MG 24 hr tablet Take 1 tablet (25 mg total) by mouth daily. 05/01/23   Patel, Sona, MD  nicotine  (NICODERM CQ  - DOSED IN MG/24 HOURS) 21 mg/24hr patch Place 1 patch (21 mg total) onto the skin daily. 05/01/23   Patel, Sona, MD  nitroGLYCERIN  (NITROSTAT ) 0.4 MG SL tablet Place 0.4 mg under the tongue every 5 (five) minutes x 3 doses as needed.    [provider]  spironolactone  (ALDACTONE ) 25 MG tablet Take 1 tablet (25 mg total) by mouth daily. 05/01/23   Patel, Sona, MD   tamsulosin  (FLOMAX ) 0.4 MG CAPS capsule Take 1 capsule (0.4 mg total) by mouth daily. 12/21/23   Stoioff, Glendia BROCKS, MD  torsemide  (DEMADEX ) 20 MG tablet Take 1 tablet (20 mg total) by mouth daily. 05/01/23   Tobie Calix, MD    Physical Exam: Vitals:   02/20/24 1103 02/20/24 1106 02/20/24 1329  BP: (!) 145/112  (!) 165/101  Pulse: (!) 130  (!) 113  Resp: 18  16  Temp: 97.7 F (36.5 C)    TempSrc: Oral    SpO2: 99%  100%  Weight:  68 kg   Height:  5' 5 (1.651 m)     Constitutional: NAD, calm, comfortable Vitals:   02/20/24 1103 02/20/24 1106 02/20/24 1329  BP: (!) 145/112  (!) 165/101  Pulse: (!) 130  (!) 113  Resp: 18  16  Temp: 97.7 F (36.5 C)    TempSrc: Oral    SpO2: 99%  100%  Weight:  68 kg   Height:  5' 5 (1.651 m)    Eyes: PERRL, lids and conjunctivae normal ENMT: Mucous membranes are dry. Posterior pharynx clear of any exudate or lesions.Normal dentition.  Neck: normal, supple, no masses, no thyromegaly Respiratory: clear to auscultation bilaterally, no wheezing, no crackles. Normal respiratory effort. No accessory muscle use.  Cardiovascular: Regular rate and rhythm, no murmurs / rubs / gallops. No extremity edema. 2+ pedal pulses. No carotid bruits.  Abdomen: no tenderness, no masses palpated. No hepatosplenomegaly. Bowel sounds positive.  Musculoskeletal: no clubbing / cyanosis. No joint deformity upper and lower extremities. Good ROM, no contractures. Normal muscle tone.  Skin: no rashes, lesions, ulcers. No induration Neurologic: CN 2-12 grossly intact. Sensation intact, DTR normal. Strength 5/5 in all 4.  Psychiatric: Normal judgment and insight. Alert and oriented x 3. Normal mood.    Labs on Admission: I have personally reviewed following labs and imaging studies  CBC: Recent Labs  Lab 02/20/24 1141  WBC 10.4  HGB 14.8  HCT 44.3  MCV 88.8  PLT 251   Basic Metabolic Panel: Recent Labs  Lab 02/20/24 1141  NA 130*  K 4.5  CL 97*  CO2 22   GLUCOSE 140*  BUN 35*  CREATININE 2.32*  CALCIUM  9.9   GFR: Estimated Creatinine Clearance: 25.8 mL/min (A) (by C-G formula based on SCr of 2.32 mg/dL (H)). Liver Function Tests: Recent Labs  Lab 02/20/24 1141  AST 15  ALT 9  ALKPHOS 120  BILITOT 0.8  PROT 7.6  ALBUMIN 3.8   No results for input(s): LIPASE, AMYLASE in the last 168 hours. No results for input(s): AMMONIA in the last 168 hours. Coagulation Profile: No results for input(s): INR, PROTIME in the last 168 hours. Cardiac Enzymes: No results for input(s): CKTOTAL, CKMB, CKMBINDEX, TROPONINI in the last 168 hours. BNP (last 3 results) No results for input(s): PROBNP in the  last 8760 hours. HbA1C: No results for input(s): HGBA1C in the last 72 hours. CBG: No results for input(s): GLUCAP in the last 168 hours. Lipid Profile: No results for input(s): CHOL, HDL, LDLCALC, TRIG, CHOLHDL, LDLDIRECT in the last 72 hours. Thyroid Function Tests: No results for input(s): TSH, T4TOTAL, FREET4, T3FREE, THYROIDAB in the last 72 hours. Anemia Panel: No results for input(s): VITAMINB12, FOLATE, FERRITIN, TIBC, IRON, RETICCTPCT in the last 72 hours. Urine analysis:    Component Value Date/Time   COLORURINE YELLOW (A) 02/20/2024 1141   APPEARANCEUR TURBID (A) 02/20/2024 1141   APPEARANCEUR Hazy (A) 08/19/2021 0855   LABSPEC 1.012 02/20/2024 1141   LABSPEC see comment 05/12/2013 0038   PHURINE 6.0 02/20/2024 1141   GLUCOSEU NEGATIVE 02/20/2024 1141   GLUCOSEU see comment 05/12/2013 0038   HGBUR SMALL (A) 02/20/2024 1141   BILIRUBINUR NEGATIVE 02/20/2024 1141   BILIRUBINUR Negative 08/19/2021 0855   BILIRUBINUR see comment 05/12/2013 0038   KETONESUR NEGATIVE 02/20/2024 1141   PROTEINUR 100 (A) 02/20/2024 1141   NITRITE POSITIVE (A) 02/20/2024 1141   LEUKOCYTESUR MODERATE (A) 02/20/2024 1141   LEUKOCYTESUR see comment 05/12/2013 0038    Radiological Exams on  Admission: No results found.  EKG: Ordered  Assessment/Plan Principal Problem:   Sepsis (HCC) Active Problems:   Sepsis secondary to UTI (HCC)   AKI (acute kidney injury)  (please populate well all problems here in Problem List. (For example, if patient is on BP meds at home and you resume or decide to hold them, it is a problem that needs to be her. Same for CAD, COPD, HLD and so on)  Complicated UTI - Secondary to severe BPH and acute urinary retention - Review of most recent urine culture in September showed pansensitive Klebsiella, plan to continue ceftriaxone  for now.  Recurrent urinary retention BPH - Secondary to severe BPH - Already on Flomax  - Patient declined surgical offer by urology on last visit in September this year.  This time, patient told me that he will consider surgical solution.  Instructed him to contact urologist office tomorrow for an office void trial in 3 to 5 days and  to discuss about surgical options.  AKI on CKD stage IIIa - Likely postrenal secondary to recurrent urinary retention, management as above. - Mild hydration, monitor volume status as patient has history of chronic HFrEF.   HTN, uncontrolled - As needed hydralazine   CAD AAA PVD - No acute concern -Patient claims that he only takes ASA and metoprolol   Cigarette smoking - Cessation education performed at bedside - Patient has no interest in quitting smoking  DVT prophylaxis: Heparin  subcu Code Status: Full code Family Communication: None at bedside Disposition Plan: Patient is sick with complicated medical conditions including UTI, AKI requiring IV fluid IV antibiotics, complicated by history of CHF, expect more than 2 midnight hospital stay. Consults called: None Admission status: Telemetry admission   Cort ONEIDA Mana MD [facility] Pager [phone]  02/20/2024, 2:00 PM

## 2024-02-20 NOTE — ED Provider Notes (Signed)
 [facility] Provider Note    Event Date/Time   First MD Initiated Contact with Patient 02/20/24 1112     (approximate)   History   Urinary Retention  HPI  Dennis Church is a 68 y.o. male with history of BPH with a prior history of urinary retention as well who presents with difficulty urinating over the last 2 to 3 days.  He reports only getting dribbles out.  Denies flank pain.  No fevers or chills.     Physical Exam   Triage Vital Signs: ED Triage Vitals  Encounter Vitals Group     BP 02/20/24 1103 (!) 145/112     Girls Systolic BP Percentile --      Girls Diastolic BP Percentile --      Boys Systolic BP Percentile --      Boys Diastolic BP Percentile --      Pulse Rate 02/20/24 1103 (!) 130     Resp 02/20/24 1103 18     Temp 02/20/24 1103 97.7 F (36.5 C)     Temp Source 02/20/24 1103 Oral     SpO2 02/20/24 1103 99 %     Weight 02/20/24 1106 68 kg (150 lb)     Height 02/20/24 1106 1.651 m (5' 5)     Head Circumference --      Peak Flow --      Pain Score 02/20/24 1106 0     Pain Loc --      Pain Education --      Exclude from Growth Chart --     Most recent vital signs: Vitals:   02/20/24 1103 02/20/24 1329  BP: (!) 145/112 (!) 165/101  Pulse: (!) 130 (!) 113  Resp: 18 16  Temp: 97.7 F (36.5 C)   SpO2: 99% 100%     General: Awake, no distress.  CV:  Good peripheral perfusion.  Resp:  Normal effort.  Abd:  No distention.  Bladder distention Other:     ED Results / Procedures / Treatments   Labs (all labs ordered are listed, but only abnormal results are displayed) Labs Reviewed  URINALYSIS, W/ REFLEX TO CULTURE (INFECTION SUSPECTED) - Abnormal; Notable for the following components:      Result Value   Color, Urine YELLOW (*)    APPearance TURBID (*)    Hgb urine dipstick SMALL (*)    Protein, ur 100 (*)    Nitrite POSITIVE (*)    Leukocytes,Ua MODERATE (*)    All other components within normal limits  CBC -  Abnormal; Notable for the following components:   RDW 16.3 (*)    All other components within normal limits  COMPREHENSIVE METABOLIC PANEL WITH GFR - Abnormal; Notable for the following components:   Sodium 130 (*)    Chloride 97 (*)    Glucose, Bld 140 (*)    BUN 35 (*)    Creatinine, Ser 2.32 (*)    GFR, Estimated 29 (*)    All other components within normal limits  URINE CULTURE     EKG     RADIOLOGY Bladder scan demonstrates over 700 cc in the bladder    PROCEDURES:  Critical Care performed:   Procedures   MEDICATIONS ORDERED IN ED: Medications  cefTRIAXone  (ROCEPHIN ) 1 g in sodium chloride  0.9 % 100 mL IVPB (0 g Intravenous Stopped 02/20/24 1330)  sodium chloride  0.9 % bolus 500 mL (0 mLs Intravenous Stopped 02/20/24 1330)     IMPRESSION /  MDM / ASSESSMENT AND PLAN / ED COURSE  I reviewed the triage vital signs and the nursing notes. Patient's presentation is most consistent with severe exacerbation of chronic illness.  Patient with a history of BPH presents with urinary retention, bladder scan demonstrates over 700 cc, will insert Foley catheter, check urinalysis he has had urinary tract infections in the past per review of urology outpatient medical records  Given that he is tachycardic upon arrival will check CBC, BMP and he may require IV fluids.  Lab work demonstrates acute kidney injury, creatinine of 2.32 up from baseline of 1.5-1.7.  Also urinalysis consistent with significant urinary tract infection, he remains tachycardic after fluids with a pulse of 113.  Given AKI likely postrenal, significant infection with tachycardia will treat with IV Rocephin , feel the patient needs hospitalization, will discuss with the hospitalist     FINAL CLINICAL IMPRESSION(S) / ED DIAGNOSES   Final diagnoses:  Urinary retention  Lower urinary tract infectious disease  Acute kidney injury     Rx / DC Orders   ED Discharge Orders     None        Note:   This document was prepared using Dragon voice recognition software and may include unintentional dictation errors.   Arlander Charleston, MD 02/20/24 1339

## 2024-02-20 NOTE — ED Triage Notes (Signed)
 Pt sts that he has been unable to urinate as normal for the last several days. Pt sts that he sees a urologist but is unsure of the name.

## 2024-02-20 NOTE — ED Notes (Signed)
 Called CCMD to add pt to monitoring.

## 2024-02-20 NOTE — Progress Notes (Signed)
 Notified by telemetry at 2023 patient had a few beats of HR at 160 then returned to 110. Current VS: Temp 98.8 Pulse 109 BP 119/74 MAP 85 resp 34 O2 99% on RA. AO& x 4 with no complaint of chest pain. Dx: Dennis Church. Hx of  chronic HFrEF, LV mural thrombosis, AAA, multivessel CAD, CKD stage IIIa. Remains on telemetry and vitals check every 4 hours. On-call provider notified.

## 2024-02-21 ENCOUNTER — Inpatient Hospital Stay

## 2024-02-21 DIAGNOSIS — I10 Essential (primary) hypertension: Secondary | ICD-10-CM

## 2024-02-21 DIAGNOSIS — N179 Acute kidney failure, unspecified: Secondary | ICD-10-CM | POA: Diagnosis not present

## 2024-02-21 DIAGNOSIS — F17210 Nicotine dependence, cigarettes, uncomplicated: Secondary | ICD-10-CM

## 2024-02-21 DIAGNOSIS — N390 Urinary tract infection, site not specified: Secondary | ICD-10-CM

## 2024-02-21 DIAGNOSIS — A419 Sepsis, unspecified organism: Secondary | ICD-10-CM

## 2024-02-21 DIAGNOSIS — N401 Enlarged prostate with lower urinary tract symptoms: Secondary | ICD-10-CM | POA: Diagnosis not present

## 2024-02-21 DIAGNOSIS — I5022 Chronic systolic (congestive) heart failure: Secondary | ICD-10-CM

## 2024-02-21 DIAGNOSIS — Z8679 Personal history of other diseases of the circulatory system: Secondary | ICD-10-CM

## 2024-02-21 DIAGNOSIS — R338 Other retention of urine: Secondary | ICD-10-CM

## 2024-02-21 LAB — BASIC METABOLIC PANEL WITH GFR
Anion gap: 11 (ref 5–15)
BUN: 36 mg/dL — ABNORMAL HIGH (ref 8–23)
CO2: 21 mmol/L — ABNORMAL LOW (ref 22–32)
Calcium: 9.3 mg/dL (ref 8.9–10.3)
Chloride: 99 mmol/L (ref 98–111)
Creatinine, Ser: 2.49 mg/dL — ABNORMAL HIGH (ref 0.61–1.24)
GFR, Estimated: 27 mL/min — ABNORMAL LOW (ref >60)
Glucose, Bld: 99 mg/dL (ref 70–99)
Potassium: 4.4 mmol/L (ref 3.5–5.1)
Sodium: 131 mmol/L — ABNORMAL LOW (ref 135–145)

## 2024-02-21 LAB — CBC
HCT: 37 % — ABNORMAL LOW (ref 39.0–52.0)
Hemoglobin: 12.3 g/dL — ABNORMAL LOW (ref 13.0–17.0)
MCH: 29.6 pg (ref 26.0–34.0)
MCHC: 33.2 g/dL (ref 30.0–36.0)
MCV: 89.2 fL (ref 80.0–100.0)
Platelets: 198 K/uL (ref 150–400)
RBC: 4.15 MIL/uL — ABNORMAL LOW (ref 4.22–5.81)
RDW: 16.7 % — ABNORMAL HIGH (ref 11.5–15.5)
WBC: 13.2 K/uL — ABNORMAL HIGH (ref 4.0–10.5)
nRBC: 0 % (ref 0.0–0.2)

## 2024-02-21 LAB — PRO BRAIN NATRIURETIC PEPTIDE: Pro Brain Natriuretic Peptide: 33758 pg/mL — ABNORMAL HIGH (ref <300.0)

## 2024-02-21 MED ORDER — SODIUM CHLORIDE 0.9 % IV SOLN: INTRAVENOUS | Status: AC

## 2024-02-21 MED ORDER — CHLORHEXIDINE GLUCONATE CLOTH 2 % EX PADS
6.0000 | MEDICATED_PAD | Freq: Every day | CUTANEOUS | Status: DC
  Administered 2024-02-21 – 2024-02-25 (×5): 6 via TOPICAL

## 2024-02-21 MED ORDER — ENOXAPARIN SODIUM 30 MG/0.3ML IJ SOSY
30.0000 mg | PREFILLED_SYRINGE | INTRAMUSCULAR | Status: DC
  Administered 2024-02-21 – 2024-02-24 (×4): 30 mg via SUBCUTANEOUS
  Filled 2024-02-21 (×4): qty 0.3

## 2024-02-21 MED ORDER — ATORVASTATIN CALCIUM 20 MG PO TABS
80.0000 mg | ORAL_TABLET | Freq: Every day | ORAL | Status: DC
  Administered 2024-02-21 – 2024-02-24 (×4): 80 mg via ORAL
  Filled 2024-02-21 (×4): qty 4

## 2024-02-21 NOTE — Assessment & Plan Note (Signed)
 Creatinine with some more worsening now at 2.42, baseline seems to be around 1.3-1.5 which is consistent with CKD stage III A.  Patient was also noncompliant with his medications, can have some progression of CKD. - Giving some more gentle IV fluid -Monitor renal function -Avoid nephrotoxins

## 2024-02-21 NOTE — Progress Notes (Signed)
 Mobility Specialist - Progress Note   02/21/24 0935  Mobility  Activity Pivoted/transferred to/from [facility]  Level of Assistance Contact guard assist, steadying assist  Assistive Device Front wheel walker  Distance Ambulated (ft) 4 ft  Activity Response Tolerated well  Mobility visit 1 Mobility  Mobility Specialist Start Time (ACUTE ONLY) [id number]  Mobility Specialist Stop Time (ACUTE ONLY) [id number]  Mobility Specialist Time Calculation (min) (ACUTE ONLY) 11 min   Pt transferred to/from the [facility] via SPT CGA, tolerated well. Pt returned to bed, left supine with needs within reach.  America Silvan Mobility Specialist 02/21/24 9:40 AM

## 2024-02-21 NOTE — Hospital Course (Signed)
 Partly taken from H&P.  Dennis Church is a 68 y.o. male with medical history significant of BPH, with recurrent urinary retention, HTN, chronic HFrEF, LV mural thrombosis, AAA, multivessel CAD, CKD stage IIIa, cigarette smoking, presented with worsening of urinary retention.   Patient with history of recurrent urinary retention secondary to severe BPH, he was proposed surgical solution by urology which was declined by patient.  On presentation patient was febrile at 102, tachycardic, a Foley was placed in ED with removal of more than 1 L of cloudy urine.  UA concerning for UTI, AKI with BUN of 35 and creatinine of 2.3, baseline seems to be around 1.2-1.5.  Sodium of 130.  Patient was started on ceftriaxone .  History of prior pansensitive Klebsiella pneumonia UTI.  Urine cultures were obtained.  11/18: Vital stable, mild leukocytosis at 13.2, persistent mild hyponatremia at 131 which seems chronic, slight worsening of creatinine to 2.49. Starting him on IV fluid.  Pending urine cultures

## 2024-02-21 NOTE — Assessment & Plan Note (Signed)
 Met sepsis criteria with fever, leukocytosis and tachycardia.  Likely secondary to UTI. Urine cultures pending. History of prior UTI with Klebsiella pneumonia which shows resistant to ampicillin and nitrofurantoin. - Continue with ceftriaxone  -Follow-up urine cultures

## 2024-02-21 NOTE — Assessment & Plan Note (Signed)
 Echo done in January 2020 with EF of less than 20%, global hypokinesis and grade 1 diastolic dysfunction. Patient stopped taking his home torsemide , spironolactone , Entresto  and all other medications. Clinically appears euvolemic. - Check BNP -Monitor volume status closely as patient is getting some IV fluid for AKI

## 2024-02-21 NOTE — Progress Notes (Signed)
 PT Cancellation Note  Patient Details Name: Dennis Church MRN: [id number] DOB: 08/15/1955   Cancelled Treatment:    Reason Eval/Treat Not Completed: Patient declined, no reason specified Will re-attempt at later time/date.  Aleda Madl 02/21/2024, 1:37 PM

## 2024-02-21 NOTE — Assessment & Plan Note (Signed)
 Patient with history of severe BPH and recurrent urinary retentions.  Urology has recommended surgical intervention but patient declined. Foley catheter was placed in ED. - Started on Flomax  -Will be going home with Foley in place and need to follow-up with his urologist.

## 2024-02-21 NOTE — Assessment & Plan Note (Signed)
 Counseling was provided, patient does not have any interest in quitting at this time. - Nicotine  patch as needed

## 2024-02-21 NOTE — Progress Notes (Signed)
   02/20/24 2152  Assess: MEWS Score  Temp (!) 102 F (38.9 C)  BP 129/80  MAP (mmHg) 95  Pulse Rate (!) 110  Resp (!) 32  SpO2 95 %  O2 Device Room Air  Assess: MEWS Score  MEWS Temp 2  MEWS Systolic 0  MEWS Pulse 1  MEWS RR 2  MEWS LOC 0  MEWS Score 5  MEWS Score Color Red  Assess: SIRS CRITERIA  SIRS Temperature  1  SIRS Respirations  1  SIRS Pulse 1  SIRS WBC 0  SIRS Score Sum  3   On-call provider aware. Dennis Church remains in no distress and reports 0/10 pain on numerical pain scale. Drank of water. Prn Tylenol  administered and vital signs increased to check every 1 hour x 2 per protocol.

## 2024-02-21 NOTE — Assessment & Plan Note (Signed)
 Blood pressure within goal this morning. - Continue low-dose metoprolol 

## 2024-02-21 NOTE — Progress Notes (Signed)
 Progress Note   Patient: Dennis Church FMW:[id number] DOB: 22-Nov-1955 DOA: 02/20/2024     1 DOS: the patient was seen and examined on 02/21/2024   Brief hospital course: Partly taken from H&P.  Dennis Church is a 68 y.o. male with medical history significant of BPH, with recurrent urinary retention, HTN, chronic HFrEF, LV mural thrombosis, AAA, multivessel CAD, CKD stage IIIa, cigarette smoking, presented with worsening of urinary retention.   Patient with history of recurrent urinary retention secondary to severe BPH, he was proposed surgical solution by urology which was declined by patient.  On presentation patient was febrile at 102, tachycardic, a Foley was placed in ED with removal of more than 1 L of cloudy urine.  UA concerning for UTI, AKI with BUN of 35 and creatinine of 2.3, baseline seems to be around 1.2-1.5.  Sodium of 130.  Patient was started on ceftriaxone .  History of prior pansensitive Klebsiella pneumonia UTI.  Urine cultures were obtained.  11/18: Vital stable, mild leukocytosis at 13.2, persistent mild hyponatremia at 131 which seems chronic, slight worsening of creatinine to 2.49. Starting him on IV fluid.  Pending urine cultures  Assessment and Plan: * Sepsis secondary to UTI (HCC) Met sepsis criteria with fever, leukocytosis and tachycardia.  Likely secondary to UTI. Urine cultures pending. History of prior UTI with Klebsiella pneumonia which shows resistant to ampicillin and nitrofurantoin. - Continue with ceftriaxone  -Follow-up urine cultures  Urinary retention due to benign prostatic hyperplasia Patient with history of severe BPH and recurrent urinary retentions.  Urology has recommended surgical intervention but patient declined. Foley catheter was placed in ED. - Started on Flomax  -Will be going home with Foley in place and need to follow-up with his urologist.  AKI (acute kidney injury) Creatinine with some more worsening now at 2.42, baseline seems  to be around 1.3-1.5 which is consistent with CKD stage III A.  Patient was also noncompliant with his medications, can have some progression of CKD. - Giving some more gentle IV fluid -Monitor renal function -Avoid nephrotoxins  Chronic HFrEF (heart failure with reduced ejection fraction) (HCC) Echo done in January 2020 with EF of less than 20%, global hypokinesis and grade 1 diastolic dysfunction. Patient stopped taking his home torsemide , spironolactone , Entresto  and all other medications. Clinically appears euvolemic. - Check BNP -Monitor volume status closely as patient is getting some IV fluid for AKI  Hypertension Blood pressure within goal this morning. - Continue low-dose metoprolol   History of CAD (coronary artery disease) No current chest pain. -Starting back on low-dose metoprolol  and statin. - Continue with aspirin   Continuous dependence on cigarette smoking Counseling was provided, patient does not have any interest in quitting at this time. - Nicotine  patch as needed   Subjective: Patient was seen and examined today.  Denies any pain.  We discussed about going home with Foley in place and following up with his urologist.  He stopped taking his home medications.  Physical Exam: Vitals:   02/20/24 2255 02/21/24 0259 02/21/24 0631 02/21/24 1124  BP: 130/85 109/72 108/68 119/72  Pulse: (!) 103 75 77 76  Resp: (!) 24 (!) 24 20 20   Temp: 99.6 F (37.6 C) 98.5 F (36.9 C) 98 F (36.7 C) 98.2 F (36.8 C)  TempSrc:   Oral Oral  SpO2: 95% 98% 100% 99%  Weight:      Height:       General.  Frail and malnourished elderly man, in no acute distress. Pulmonary.  Lungs clear  bilaterally, normal respiratory effort. CV.  Regular rate and rhythm, no JVD, rub or murmur. Abdomen.  Soft, nontender, nondistended, BS positive. CNS.  Alert and oriented .  No focal neurologic deficit. Extremities.  No edema, no cyanosis, pulses intact and symmetrical.  Data Reviewed: Prior  data reviewed  Family Communication: Tried calling son and daughter listed in his chart with no response.  Disposition: Status is: Inpatient Remains inpatient appropriate because: Severity of illness  Planned Discharge Destination: Home  DVT prophylaxis.  Lovenox Time spent: 50 minutes  This record has been created using Conservation officer, historic buildings. Errors have been sought and corrected,but may not always be located. Such creation errors do not reflect on the standard of care.   Author: Amaryllis Dare, MD 02/21/2024 1:47 PM  For on call review [email or web address].

## 2024-02-21 NOTE — Assessment & Plan Note (Signed)
 No current chest pain. -Starting back on low-dose metoprolol  and statin. - Continue with aspirin 

## 2024-02-21 NOTE — Progress Notes (Signed)
   02/20/24 2051  Assess: MEWS Score  Temp 98.9 F (37.2 C)  BP 119/74  MAP (mmHg) 85  Pulse Rate (!) 109  ECG Heart Rate (!) 113  Resp (!) 34  Level of Consciousness Alert  SpO2 99 %  O2 Device Room Air  Assess: MEWS Score  MEWS Temp 0  MEWS Systolic 0  MEWS Pulse 2  MEWS RR 2  MEWS LOC 0  MEWS Score 4  MEWS Score Color Red  Assess: SIRS CRITERIA  SIRS Temperature  0  SIRS Respirations  1  SIRS Pulse 1  SIRS WBC 0  SIRS Score Sum  2   On-call provider aware.

## 2024-02-22 ENCOUNTER — Other Ambulatory Visit: Payer: Self-pay | Admitting: Physician Assistant

## 2024-02-22 DIAGNOSIS — N401 Enlarged prostate with lower urinary tract symptoms: Secondary | ICD-10-CM

## 2024-02-22 DIAGNOSIS — R339 Retention of urine, unspecified: Secondary | ICD-10-CM | POA: Diagnosis not present

## 2024-02-22 DIAGNOSIS — N289 Disorder of kidney and ureter, unspecified: Secondary | ICD-10-CM | POA: Diagnosis not present

## 2024-02-22 DIAGNOSIS — N390 Urinary tract infection, site not specified: Secondary | ICD-10-CM | POA: Diagnosis not present

## 2024-02-22 DIAGNOSIS — N210 Calculus in bladder: Secondary | ICD-10-CM

## 2024-02-22 DIAGNOSIS — A419 Sepsis, unspecified organism: Secondary | ICD-10-CM | POA: Diagnosis not present

## 2024-02-22 LAB — IRON AND TIBC
Iron: 19 ug/dL — ABNORMAL LOW (ref 45–182)
Saturation Ratios: 10 % — ABNORMAL LOW (ref 17.9–39.5)
TIBC: 185 ug/dL — ABNORMAL LOW (ref 250–450)
UIBC: 166 ug/dL

## 2024-02-22 LAB — RENAL FUNCTION PANEL
Albumin: 2.7 g/dL — ABNORMAL LOW (ref 3.5–5.0)
Anion gap: 9 (ref 5–15)
BUN: 44 mg/dL — ABNORMAL HIGH (ref 8–23)
CO2: 20 mmol/L — ABNORMAL LOW (ref 22–32)
Calcium: 8.4 mg/dL — ABNORMAL LOW (ref 8.9–10.3)
Chloride: 103 mmol/L (ref 98–111)
Creatinine, Ser: 2.27 mg/dL — ABNORMAL HIGH (ref 0.61–1.24)
GFR, Estimated: 30 mL/min — ABNORMAL LOW (ref >60)
Glucose, Bld: 85 mg/dL (ref 70–99)
Phosphorus: 2.4 mg/dL — ABNORMAL LOW (ref 2.5–4.6)
Potassium: 4.1 mmol/L (ref 3.5–5.1)
Sodium: 131 mmol/L — ABNORMAL LOW (ref 135–145)

## 2024-02-22 LAB — CBC
HCT: 32.5 % — ABNORMAL LOW (ref 39.0–52.0)
Hemoglobin: 10.8 g/dL — ABNORMAL LOW (ref 13.0–17.0)
MCH: 29.7 pg (ref 26.0–34.0)
MCHC: 33.2 g/dL (ref 30.0–36.0)
MCV: 89.3 fL (ref 80.0–100.0)
Platelets: 197 K/uL (ref 150–400)
RBC: 3.64 MIL/uL — ABNORMAL LOW (ref 4.22–5.81)
RDW: 16.8 % — ABNORMAL HIGH (ref 11.5–15.5)
WBC: 8.2 K/uL (ref 4.0–10.5)
nRBC: 0 % (ref 0.0–0.2)

## 2024-02-22 LAB — VITAMIN B12: Vitamin B-12: 160 pg/mL — ABNORMAL LOW (ref 180–914)

## 2024-02-22 LAB — FOLATE: Folate: <3 ng/mL — ABNORMAL LOW (ref >5.9)

## 2024-02-22 LAB — MAGNESIUM: Magnesium: 2 mg/dL (ref 1.7–2.4)

## 2024-02-22 LAB — VITAMIN D 25 HYDROXY (VIT D DEFICIENCY, FRACTURES): Vit D, 25-Hydroxy: 18.89 ng/mL — ABNORMAL LOW (ref 30–100)

## 2024-02-22 MED ORDER — BISACODYL 5 MG PO TBEC
10.0000 mg | DELAYED_RELEASE_TABLET | Freq: Every day | ORAL | Status: DC
  Administered 2024-02-22: 10 mg via ORAL
  Filled 2024-02-22 (×3): qty 2

## 2024-02-22 MED ORDER — POLYSACCHARIDE IRON COMPLEX 150 MG PO CAPS
150.0000 mg | ORAL_CAPSULE | Freq: Every day | ORAL | Status: DC
  Administered 2024-02-22 – 2024-02-25 (×4): 150 mg via ORAL
  Filled 2024-02-22 (×4): qty 1

## 2024-02-22 MED ORDER — BISACODYL 5 MG PO TBEC
10.0000 mg | DELAYED_RELEASE_TABLET | Freq: Once | ORAL | Status: AC
  Administered 2024-02-22: 10 mg via ORAL
  Filled 2024-02-22: qty 2

## 2024-02-22 MED ORDER — POLYETHYLENE GLYCOL 3350 17 G PO PACK
17.0000 g | PACK | Freq: Two times a day (BID) | ORAL | Status: DC
  Administered 2024-02-22 – 2024-02-24 (×3): 17 g via ORAL
  Filled 2024-02-22 (×6): qty 1

## 2024-02-22 MED ORDER — BISACODYL 10 MG RE SUPP: 10.0000 mg | Freq: Every day | RECTAL | Status: DC | PRN

## 2024-02-22 MED ORDER — VITAMIN C 500 MG PO TABS
500.0000 mg | ORAL_TABLET | Freq: Every day | ORAL | Status: DC
  Administered 2024-02-22 – 2024-02-25 (×4): 500 mg via ORAL
  Filled 2024-02-22 (×4): qty 1

## 2024-02-22 NOTE — Care Management Important Message (Signed)
 Important Message  Patient Details  Name: Dennis Church MRN: [id number] Date of Birth: [birth year]   Important Message Given:  Yes - Medicare IM     Rojelio SHAUNNA Rattler 02/22/2024, 3:16 PM

## 2024-02-22 NOTE — Plan of Care (Signed)
  Problem: Education: Goal: Knowledge of General Education information will improve Description: Including pain rating scale, medication(s)/side effects and non-pharmacologic comfort measures Outcome: Progressing   Problem: Clinical Measurements: Goal: Ability to maintain clinical measurements within normal limits will improve Outcome: Progressing Goal: Will remain free from infection Outcome: Progressing Goal: Diagnostic test results will improve Outcome: Progressing Goal: Respiratory complications will improve Outcome: Progressing Goal: Cardiovascular complication will be avoided Outcome: Progressing   Problem: Activity: Goal: Risk for activity intolerance will decrease Outcome: Progressing   Problem: Pain Managment: Goal: General experience of comfort will improve and/or be controlled Outcome: Progressing   Problem: Safety: Goal: Ability to remain free from injury will improve Outcome: Progressing

## 2024-02-22 NOTE — Evaluation (Signed)
 Physical Therapy Evaluation Patient Details Name: Dennis Church MRN: [id number] DOB: [birth year] Today's Date: 02/22/2024  History of Present Illness  Pt is a 68 y/o M presenting to ED with c/o difficulty urinating. MD assessment includes complicated UTI, recurrent urinary retention, AKI on CKD. Pmh significant for BPH, HTN, chronic HFrEF, LV mural thrombosis, AAA, multivessel CAD, CKD stage IIIa.   Clinical Impression  Pt A&Ox3, agreeable to participate in PT evaluation with encouragement. At baseline, pt is IND with mobility/ADLs, cites multiple recent falls. Pt was received in bed, performed bed mobility modI and STS transfers with CGA, VC for hand placement on RW for transfers. Pt amb ~14ft with RW and CGA, had one lateral LOB requiring minA to correct, was overall unsteady throughout ambulation with intermittent staggered step pattern. Pt educated on use of RW at home for max stability and decrease falls risk, demonstrated poor insight into his current deficits. Pt was left sitting in recliner at end of session with all needs in reach. Pt would benefit from skilled PT intervention to address listed deficits (see PT Problem List) and allow for safe return to PLOF.         If plan is discharge home, recommend the following: A little help with walking and/or transfers;A little help with bathing/dressing/bathroom;Direct supervision/assist for medications management;Direct supervision/assist for financial management;Assist for transportation;Help with stairs or ramp for entrance   Can travel by private vehicle        Equipment Recommendations None recommended by PT  Recommendations for Other Services       Functional Status Assessment Patient has had a recent decline in their functional status and demonstrates the ability to make significant improvements in function in a reasonable and predictable amount of time.     Precautions / Restrictions Precautions Precautions: Fall Recall of  Precautions/Restrictions: Impaired Restrictions Weight Bearing Restrictions Per Provider Order: No      Mobility  Bed Mobility Overal bed mobility: Modified Independent             General bed mobility comments: no physical assistance for supine > sit transfer    Transfers Overall transfer level: Needs assistance Equipment used: Rolling walker (2 wheels) Transfers: Sit to/from Stand Sit to Stand: Contact guard assist           General transfer comment: STS from lowest bed height with CGA, VC for hand placement on RW    Ambulation/Gait Ambulation/Gait assistance: Contact guard assist, Min assist Gait Distance (Feet): 175 Feet Assistive device: Rolling walker (2 wheels) Gait Pattern/deviations: Step-through pattern, Narrow base of support Gait velocity: decreased     General Gait Details: Pt had one near LOB requiring minA to correct and prevent full LOB. Intermittent staggering and just overall unsteady gait pattern. Poor ability to dual task (walk and talk), requiried frequent standing rest breaks  Stairs            Wheelchair Mobility     Tilt Bed    Modified Rankin (Stroke Patients Only)       Balance Overall balance assessment: Needs assistance Sitting-balance support: Feet supported Sitting balance-Leahy Scale: Good Sitting balance - Comments: steady static and dynamic sitting   Standing balance support: Bilateral upper extremity supported, Reliant on assistive device for balance Standing balance-Leahy Scale: Poor Standing balance comment: requires external support for max stability, unsteady with no UE support  Pertinent Vitals/Pain Pain Assessment Pain Assessment: No/denies pain    Home Living Family/patient expects to be discharged to:: Private residence Living Arrangements: Alone   Type of Home: House Home Access: Stairs to enter Entrance Stairs-Rails: Right;Left;Can reach both Water Quality Scientist of Steps: 5   Home Layout: One level Home Equipment: Cane - single Librarian, Academic (2 wheels) Additional Comments: pt reports he does not have help at home, neighbor was previously able to help him but reports his neighbor was recently taken to a home    Prior Function Prior Level of Function : Independent/Modified Independent;Driving;History of Falls (last six months)             Mobility Comments: Pt reports no AD for ambulation, states that he has a a lot of falls in the last 6 months that he attributes to moving around too quickly ADLs Comments: IND with ADLs, driving, states he manages his own medications at home     Extremity/Trunk Assessment   Upper Extremity Assessment Upper Extremity Assessment: Overall WFL for tasks assessed    Lower Extremity Assessment Lower Extremity Assessment: Generalized weakness       Communication   Communication Communication: No apparent difficulties    Cognition Arousal: Alert Behavior During Therapy: WFL for tasks assessed/performed   PT - Cognitive impairments: Awareness, Attention, Safety/Judgement                       PT - Cognition Comments: Pt oriented x3, questionable cognitive status with poor insight into his deficits, required frequent redirection to task Following commands: Impaired Following commands impaired: Follows one step commands with increased time     Cueing Cueing Techniques: Verbal cues, Tactile cues, Visual cues     General Comments      Exercises     Assessment/Plan    PT Assessment Patient needs continued PT services  PT Problem List Decreased strength;Decreased activity tolerance;Decreased balance;Decreased mobility;Decreased coordination;Decreased cognition;Decreased knowledge of use of DME;Decreased safety awareness       PT Treatment Interventions DME instruction;Gait training;Stair training;Functional mobility training;Therapeutic activities;Therapeutic  exercise;Balance training;Cognitive remediation;Patient/family education    PT Goals (Current goals can be found in the Care Plan section)  Acute Rehab PT Goals Patient Stated Goal: to go home PT Goal Formulation: With patient Time For Goal Achievement: 03/07/24 Potential to Achieve Goals: Fair    Frequency Min 2X/week     Co-evaluation               AM-PAC PT 6 Clicks Mobility  Outcome Measure Help needed turning from your back to your side while in a flat bed without using bedrails?: None Help needed moving from lying on your back to sitting on the side of a flat bed without using bedrails?: A Little Help needed moving to and from a bed to a chair (including a wheelchair)?: A Little Help needed standing up from a chair using your arms (e.g., wheelchair or bedside chair)?: A Little Help needed to walk in hospital room?: A Little Help needed climbing 3-5 steps with a railing? : A Little 6 Click Score: 19    End of Session Equipment Utilized During Treatment: Gait belt Activity Tolerance: Patient tolerated treatment well Patient left: in chair;with call bell/phone within reach;with chair alarm set Nurse Communication: Mobility status PT Visit Diagnosis: Unsteadiness on feet (R26.81);Muscle weakness (generalized) (M62.81);History of falling (Z91.81)    Time: 9147-9087 PT Time Calculation (min) (ACUTE ONLY): 20 min   Charges:   PT  Evaluation $PT Eval Low Complexity: 1 Low PT Treatments $Gait Training: 8-22 mins PT General Charges $$ ACUTE PT VISIT: 1 Visit        Janell Axe, SPT

## 2024-02-22 NOTE — Consult Note (Signed)
 Urology Consult  I have been asked to see the patient by Dr. Von, for evaluation and management of BPH with urinary retention, bladder stone.  Chief Complaint: Urinary retention  History of Present Illness: Dennis Church is a 68 y.o. year old male with PMH CAD, MI, HFrEF, AAA and BPH with urinary retention and bladder stone who previously declined surgical intervention admitted on 02/20/2024 with acute complicated UTI with recurrent urinary retention and AKI on CKD 3a, now s/p Foley catheter replacement (>1L UOP with placement).  He is on empiric Rocephin  and urine cultures are growing gram-negative rods.  Renal ultrasound on admission showed right bladder diverticula, moderately enlarged 2.3 cm bladder stone, and prostatomegaly.  He reports he is ready to proceed with outlet procedures as previously recommended by our team.  He has been hesitant to do this because his uncle died from an MI around the time of prostate surgery.  He states he takes very good care of his heart, though he does not currently have a cardiologist because he is retired.  Anti-infectives (From admission, onward)    Start     Dose/Rate Route Frequency Ordered Stop   02/21/24 1000  cefTRIAXone  (ROCEPHIN ) 2 g in sodium chloride  0.9 % 100 mL IVPB        2 g 200 mL/hr over 30 Minutes Intravenous Every 24 hours 02/20/24 1359     02/20/24 1245  cefTRIAXone  (ROCEPHIN ) 1 g in sodium chloride  0.9 % 100 mL IVPB        1 g 200 mL/hr over 30 Minutes Intravenous  Once 02/20/24 1242 02/20/24 1330        Past Medical History:  Diagnosis Date   Anginal pain    Coronary artery disease    Hyperlipidemia    Hypertension    Myocardial infarction [facility])    Renal disorder    kidney stones    Past Surgical History:  Procedure Laterality Date   CARDIAC CATHETERIZATION Left 04/25/2015   Procedure: Left Heart Cath and Coronary Angiography;  Surgeon: Vinie DELENA Jude, MD;  Location: [facility] INVASIVE CV LAB;  Service:  Cardiovascular;  Laterality: Left;   CAROTID ENDARTERECTOMY Left    CORONARY ANGIOPLASTY WITH STENT PLACEMENT     LITHOTRIPSY      Home Medications:  Current Meds  Medication Sig   tamsulosin  (FLOMAX ) 0.4 MG CAPS capsule Take 1 capsule (0.4 mg total) by mouth daily.    Allergies: No Known Allergies  History reviewed. No pertinent family history.  Social History:  reports that he has been smoking cigarettes. He has never used smokeless tobacco. He reports that he does not currently use drugs. He reports that he does not drink alcohol.  ROS: A complete review of systems was performed.  All systems are negative except for pertinent findings as noted.  Physical Exam:  Vital signs in last 24 hours: Temp:  [98.2 F (36.8 C)-98.9 F (37.2 C)] 98.7 F (37.1 C) (11/19 0744) Pulse Rate:  [70-78] 70 (11/19 0744) Resp:  [18-20] 18 (11/19 0744) BP: (111-128)/(63-72) 122/72 (11/19 0744) SpO2:  [96 %-99 %] 96 % (11/19 0744) Constitutional:  Alert and oriented, no acute distress HEENT: [place] AT, moist mucus membranes Cardiovascular: No clubbing, cyanosis, or edema Respiratory: Normal respiratory effort Lymph: No cervical or inguinal adenopathy Neurologic: Grossly intact, no focal deficits, moving all 4 extremities Psychiatric: Normal mood and affect  Laboratory Data:  Recent Labs    02/20/24 1141 02/21/24 0452 02/22/24 0526  WBC 10.4 13.2* 8.2  HGB 14.8 12.3* 10.8*  HCT 44.3 37.0* 32.5*   Recent Labs    02/20/24 1141 02/21/24 0452 02/22/24 0526  NA 130* 131* 131*  K 4.5 4.4 4.1  CL 97* 99 103  CO2 22 21* 20*  GLUCOSE 140* 99 85  BUN 35* 36* 44*  CREATININE 2.32* 2.49* 2.27*  CALCIUM  9.9 9.3 8.4*   Urinalysis    Component Value Date/Time   COLORURINE YELLOW (A) 02/20/2024 1141   APPEARANCEUR TURBID (A) 02/20/2024 1141   APPEARANCEUR Hazy (A) 08/19/2021 0855   LABSPEC 1.012 02/20/2024 1141   LABSPEC see comment 05/12/2013 0038   PHURINE 6.0 02/20/2024 1141    GLUCOSEU NEGATIVE 02/20/2024 1141   GLUCOSEU see comment 05/12/2013 0038   HGBUR SMALL (A) 02/20/2024 1141   BILIRUBINUR NEGATIVE 02/20/2024 1141   BILIRUBINUR Negative 08/19/2021 0855   BILIRUBINUR see comment 05/12/2013 0038   KETONESUR NEGATIVE 02/20/2024 1141   PROTEINUR 100 (A) 02/20/2024 1141   NITRITE POSITIVE (A) 02/20/2024 1141   LEUKOCYTESUR MODERATE (A) 02/20/2024 1141   LEUKOCYTESUR see comment 05/12/2013 0038   Results for orders placed or performed during the hospital encounter of 02/20/24  Urine Culture     Status: Abnormal (Preliminary result)   Collection Time: 02/20/24 11:41 AM   Specimen: Urine, Catheterized  Result Value Ref Range Status   Specimen Description   Final    URINE, CATHETERIZED Performed at [facility], [address] [place], [place] [address]    Special Requests   Final    NONE Reflexed from [id number] Performed at [facility], [address] [place], [place] [address]    Culture (A)  Final    >=100,000 COLONIES/mL VONNE NEGATIVE RODS VONNE NEGATIVE RODS SUSCEPTIBILITIES TO FOLLOW Performed at [facility], [address], [place], [place] [address]    Report Status PENDING  Incomplete    Radiologic Imaging: US  RENAL Result Date: 02/21/2024 CLINICAL DATA:  Acute kidney injury. EXAM: RENAL / URINARY TRACT ULTRASOUND COMPLETE COMPARISON:  None Available. FINDINGS: Right Kidney: Renal measurements: 11.5 cm x 4.0 cm x 4.8 cm = volume: 117 mL. There is diffusely increased echogenicity of the renal parenchyma. No mass or hydronephrosis visualized. Left Kidney: Renal measurements: 9.6 cm x 4.7 cm x 6.0 cm = volume: 140 mL. There is diffusely increased echogenicity of the renal parenchyma. No mass or hydronephrosis visualized. Bladder: A Foley catheter is in place. Diffuse urinary bladder wall thickening and right-sided urinary bladder diverticula are seen. A 2.3 cm urinary bladder calculus is also noted. Other: The prostate gland  measures 4.8 cm x 6.2 cm x 4.6 cm (volume 71.99 mL). Of incidental note is the presence of a known 5.7 cm infrarenal abdominal aortic aneurysm. IMPRESSION: 1. Bilateral echogenic kidneys likely secondary to sequelae associated with medical renal disease. 2. Urinary bladder wall thickening with right-sided urinary bladder diverticula and a 2.3 cm urinary bladder calculus. Further evaluation with urinalysis is recommended as acute cystitis cannot be excluded. 3. Prostatomegaly.  Correlation with PSA levels is recommended. 4. Infrarenal abdominal aortic aneurysm. Electronically Signed   By: Suzen Dials M.D.   On: 02/21/2024 22:57   DG Chest 1 View Result Date: 02/21/2024 CLINICAL DATA:  Congestive heart failure EXAM: CHEST  1 VIEW COMPARISON:  04/25/2023 FINDINGS: Emphysema. Coronary stent noted. Upper zone pulmonary vascular prominence could reflect pulmonary venous hypertension but there is no overt cardiomegaly at this time. No blunting of the costophrenic angles. No discrete airspace opacity. IMPRESSION: 1.  Upper zone pulmonary vascular prominence could reflect pulmonary venous hypertension but there is no overt cardiomegaly at this time. 2.  Emphysema (ICD10-J43.9). 3. Coronary stent noted. Electronically Signed   By: Ryan Salvage M.D.   On: 02/21/2024 08:28   Assessment & Plan:  69 y.o. comorbid male with a history of BPH with urinary retention and bladder stone now admitted with recurrent retention, complicated UTI, and acute on chronic renal insufficiency.  Foley catheter has been replaced.  I recommended pursuing HoLEP and cystolitholapaxy per prior urology recommendations.  He is in agreement.  Given his extensive cardiovascular history, I do think he would benefit from cardiac clearance prior to surgical intervention.  It sounds like he may require an outpatient referral to a new provider.  Recommendations: - Continue Foley catheter with plans for monthly exchanges until he undergoes  surgery as below - Outpatient HOLEP with cystolitholapaxy with Dr. Francisca.  I am placing OR orders today and will arrange outpatient follow-up in clinic for them to discuss further. - Follow cultures, antibiotics per primary team.  Thank you for involving me in this patient's care, please page with any further questions or concerns.  Bartley Vuolo, PA-C 02/22/2024 1:50 PM

## 2024-02-22 NOTE — Plan of Care (Signed)

## 2024-02-22 NOTE — Progress Notes (Signed)
 Progress Note   Patient: Dennis Church FMW:[id number] DOB: 01/10/1956 DOA: 02/20/2024     2 DOS: the patient was seen and examined on 02/22/2024   Brief hospital course: Partly taken from H&P.  ROMULUS HANRAHAN is a 68 y.o. male with medical history significant of BPH, with recurrent urinary retention, HTN, chronic HFrEF, LV mural thrombosis, AAA, multivessel CAD, CKD stage IIIa, cigarette smoking, presented with worsening of urinary retention.   Patient with history of recurrent urinary retention secondary to severe BPH, he was proposed surgical solution by urology which was declined by patient.  On presentation patient was febrile at 102, tachycardic, a Foley was placed in ED with removal of more than 1 L of cloudy urine.  UA concerning for UTI, AKI with BUN of 35 and creatinine of 2.3, baseline seems to be around 1.2-1.5.  Sodium of 130.  Patient was started on ceftriaxone .  History of prior pansensitive Klebsiella pneumonia UTI.  Urine cultures were obtained.  11/18: Vital stable, mild leukocytosis at 13.2, persistent mild hyponatremia at 131 which seems chronic, slight worsening of creatinine to 2.49. Starting him on IV fluid.  Pending urine cultures  Assessment and Plan:  # Sepsis secondary to UTI (HCC) Met sepsis criteria with fever, leukocytosis and tachycardia.  Likely secondary to UTI. Urine cultures pending. History of prior UTI with Klebsiella pneumonia which shows resistant to ampicillin and nitrofurantoin. - Continue with ceftriaxone  -Follow-up urine cultures growing E. coli and Klebsiella, follow sensitivity report.   Urinary retention due to benign prostatic hyperplasia Patient with history of severe BPH and recurrent urinary retentions.  Urology has recommended surgical intervention but patient declined. Foley catheter was placed in ED. - Started on Flomax  -Will be going home with Foley in place and need to follow-up with his urologist. Urology consulted, recommended  to continue Foley catheter for monthly exchange as an outpatient until he goes under surgery.  Outpatient HoLEP with cystolitholapaxy with Dr. Francisca   AKI (acute kidney injury) Creatinine with some more worsening now at 2.42, baseline seems to be around 1.3-1.5 which is consistent with CKD stage III A.  Patient was also noncompliant with his medications, can have some progression of CKD. - Giving some more gentle IV fluid -Monitor renal function -Avoid nephrotoxins sCr 2.2 gradually improving  Chronic HFrEF (heart failure with reduced ejection fraction) (HCC) Echo done in January 2020 with EF of less than 20%, global hypokinesis and grade 1 diastolic dysfunction. Patient stopped taking his home torsemide , spironolactone , Entresto  and all other medications. Clinically appears euvolemic. - Check BNP -Monitor volume status closely as patient is getting some IV fluid for AKI  Hypertension Blood pressure within goal this morning. - Continue low-dose metoprolol   History of CAD (coronary artery disease) No current chest pain. -Starting back on low-dose metoprolol  and statin. - Continue with aspirin   Continuous dependence on cigarette smoking Counseling was provided, patient does not have any interest in quitting at this time. - Nicotine  patch as needed  # Iron  deficiency anemia, Tsat 10% Started oral iron  supplement with vitamin C .  Follow-up with PCP to repeat iron  profile after 3 to 6 months.  # Constipation, continue laxative   Subjective: No significant events overnight.  Overall patient is feeling better Patient agreed with the urology consult and follow-ups. We will continue current treatment and follow along.  Most likely discharge plan in 1 to 2 days once urine culture is back. Patient agreed with above management plan   Physical Exam: Vitals:  02/21/24 1705 02/21/24 2016 02/22/24 0252 02/22/24 0744  BP: 113/70 128/63 111/71 122/72  Pulse: 75 78 76 70  Resp: 18 20 20  18   Temp: 98.6 F (37 C) 98.9 F (37.2 C) 98.2 F (36.8 C) 98.7 F (37.1 C)  TempSrc: Oral     SpO2: 97% 99% 97% 96%  Weight:      Height:       General.  Frail and malnourished elderly man, in no acute distress. Pulmonary.  Lungs clear bilaterally, normal respiratory effort. CV.  Regular rate and rhythm, no JVD, rub or murmur. Abdomen.  Soft, nontender, nondistended, BS positive. CNS.  Alert and oriented .  No focal neurologic deficit. Extremities.  No edema, no cyanosis, pulses intact and symmetrical.  Data Reviewed: Prior data reviewed  Family Communication: Tried calling son and daughter listed in his chart with no response.  Disposition: Status is: Inpatient Remains inpatient appropriate because: Severity of illness  Planned Discharge Destination: Home  DVT prophylaxis.  Lovenox  Time spent: 55 minutes  This record has been created using Conservation officer, historic buildings. Errors have been sought and corrected,but may not always be located. Such creation errors do not reflect on the standard of care.   Author: Elvan Sor, MD 02/22/2024 4:01 PM  For on call review [email or web address].

## 2024-02-22 NOTE — TOC Initial Note (Signed)
 Transition of Care [facility]) - Initial/Assessment Note    Patient Details  Name: Dennis Church MRN: [id number] Date of Birth: 13-Jun-1955  Transition of Care [facility]) CM/SW Contact:    Daved JONETTA Hamilton, RN Phone Number: 02/22/2024, 3:55 PM  Clinical Narrative:                Late Entry:    Met with patient at bedside, introduced self and explained role in discharge planning. Patient verbalized he lives alone in a home that he owns and is completely independent in ADL's including driving, and that he has two trucks at this time. Patient discussed at length the frustration he is feeling in regards to losing his independence. Patient verbalized he has no one here to help him and it's just him. Patient verbalized he wants to get back to how he was before hospital encounters.  This CM discussed with patient therapy recommendations for [facility] PT. Patient verbalized he did not want to do that. After continued discussion with patient, patient verbalized he didn't know what to do regarding HH PT. Discussed with patient his stated goal of getting back to baseline and that [facility] PT would assist patient in meeting this goal, patient verbalized understanding of this however, patient continued to verbalize being unsure if he will accept [facility]. This CM advised patient will give him time to think it over and TOC will follow back up with him for decision. Patient verbalized understanding and agreement with this plan.   This CM notes at time of this documentation no PCP on file, will follow up with patient regarding PCP requirement for Home Health.      Expected Discharge Plan: Home w Home Health Services Barriers to Discharge: Continued Medical Work up   Patient Goals and CMS Choice Patient states their goals for this hospitalization and ongoing recovery are:: To continue to be independent          Expected Discharge Plan and Services       Living arrangements for the past 2 months: Single Family Home                                       Prior Living Arrangements/Services Living arrangements for the past 2 months: Single Family Home Lives with:: Self Patient language and need for interpreter reviewed:: Yes Do you feel safe going back to the place where you live?: Yes      Need for Family Participation in Patient Care: No (Comment) Care giver support system in place?: No (comment) (Patient verbalized he lives alone. Has a son that lives a couple of counties away however, he only sees his son about once a month. His daughter lives in [place] )   Criminal Activity/Legal Involvement Pertinent to Current Situation/Hospitalization: No - Comment as needed  Activities of Daily Living   ADL Screening (condition at time of admission) Independently performs ADLs?: No Does the patient have a NEW difficulty with bathing/dressing/toileting/self-feeding that is expected to last >3 days?: Yes (Initiates electronic notice to provider for possible OT consult) Does the patient have a NEW difficulty with getting in/out of bed, walking, or climbing stairs that is expected to last >3 days?: Yes (Initiates electronic notice to provider for possible PT consult) Does the patient have a NEW difficulty with communication that is expected to last >3 days?: No Is the patient deaf or have difficulty hearing?: No Does the patient have  difficulty seeing, even when wearing glasses/contacts?: No Does the patient have difficulty concentrating, remembering, or making decisions?: No  Permission Sought/Granted Permission sought to share information with : Facility Industrial/product Designer granted to share information with : Yes, Verbal Permission Granted     Permission granted to share info w AGENCY: Facility Contacts        Emotional Assessment Appearance:: Appears older than stated age, Disheveled Attitude/Demeanor/Rapport: Guarded, Engaged Affect (typically observed): Anxious, Frustrated,  Overwhelmed Orientation: : Oriented to Self, Oriented to Place, Oriented to  Time, Oriented to Situation Alcohol / Substance Use: Not Applicable Psych Involvement: No (comment)  Admission diagnosis:  Lower urinary tract infectious disease [N39.0] Urinary retention [R33.9] Acute kidney injury [N17.9] Sepsis (HCC) [A41.9] Patient Active Problem List   Diagnosis Date Noted   Urinary retention due to benign prostatic hyperplasia 02/21/2024   Continuous dependence on cigarette smoking 02/21/2024   AKI (acute kidney injury) 02/20/2024   Sepsis (HCC) 02/20/2024   Chronic HFrEF (heart failure with reduced ejection fraction) (HCC) 04/26/2023   AAA (abdominal aortic aneurysm) 04/26/2023   Involuntary commitment 04/26/2023   Acute stroke due to ischemia (HCC) 04/25/2023   Benign prostatic hyperplasia 04/24/2023   Hypertension 04/24/2023   Tobacco use disorder 04/24/2023   Acute respiratory failure with hypoxia (HCC) 04/24/2023   CKD stage 3a, GFR 45-59 ml/min (HCC) 04/24/2023   Sepsis secondary to UTI (HCC) 04/24/2023   Pleural effusion on right 04/24/2023   Mass of right lung 04/24/2023   LV (left ventricular) mural thrombus 04/24/2023   Combined fat and carbohydrate induced hyperlipemia 06/26/2014   History of CAD (coronary artery disease) 06/26/2014   Hyperlipidemia, mixed 06/26/2014   PCP:  Patient, No Pcp Per Pharmacy:   [facility] - [place], [place] - [address] [place] [address] Phone: [phone] Fax: [phone]  [id number] - [place], [place] - [address] [place] [address] Phone: [phone] Fax: [phone]     Social Drivers of Health (SDOH) Social History: SDOH Screenings   Food Insecurity: No Food Insecurity (02/20/2024)  Housing: Unknown (02/20/2024)  Transportation Needs: No Transportation Needs (02/20/2024)  Utilities: Not At Risk (02/20/2024)  Social Connections: Socially Isolated (02/20/2024)  Tobacco Use:  High Risk (02/20/2024)   SDOH Interventions:     Readmission Risk Interventions     No data to display

## 2024-02-23 DIAGNOSIS — A419 Sepsis, unspecified organism: Secondary | ICD-10-CM | POA: Diagnosis not present

## 2024-02-23 DIAGNOSIS — N390 Urinary tract infection, site not specified: Secondary | ICD-10-CM | POA: Diagnosis not present

## 2024-02-23 LAB — URINE CULTURE: Culture: >=100000 — AB

## 2024-02-23 LAB — CBC
HCT: 33.8 % — ABNORMAL LOW (ref 39.0–52.0)
Hemoglobin: 11.3 g/dL — ABNORMAL LOW (ref 13.0–17.0)
MCH: 29.8 pg (ref 26.0–34.0)
MCHC: 33.4 g/dL (ref 30.0–36.0)
MCV: 89.2 fL (ref 80.0–100.0)
Platelets: 220 K/uL (ref 150–400)
RBC: 3.79 MIL/uL — ABNORMAL LOW (ref 4.22–5.81)
RDW: 16.4 % — ABNORMAL HIGH (ref 11.5–15.5)
WBC: 7 K/uL (ref 4.0–10.5)
nRBC: 0 % (ref 0.0–0.2)

## 2024-02-23 LAB — BASIC METABOLIC PANEL WITH GFR
Anion gap: 8 (ref 5–15)
BUN: 47 mg/dL — ABNORMAL HIGH (ref 8–23)
CO2: 22 mmol/L (ref 22–32)
Calcium: 8.9 mg/dL (ref 8.9–10.3)
Chloride: 102 mmol/L (ref 98–111)
Creatinine, Ser: 2.16 mg/dL — ABNORMAL HIGH (ref 0.61–1.24)
GFR, Estimated: 32 mL/min — ABNORMAL LOW (ref >60)
Glucose, Bld: 89 mg/dL (ref 70–99)
Potassium: 4.5 mmol/L (ref 3.5–5.1)
Sodium: 131 mmol/L — ABNORMAL LOW (ref 135–145)

## 2024-02-23 LAB — PHOSPHORUS: Phosphorus: 2.1 mg/dL — ABNORMAL LOW (ref 2.5–4.6)

## 2024-02-23 LAB — MAGNESIUM: Magnesium: 2.1 mg/dL (ref 1.7–2.4)

## 2024-02-23 MED ORDER — HALOPERIDOL LACTATE 5 MG/ML IJ SOLN
1.0000 mg | Freq: Four times a day (QID) | INTRAMUSCULAR | Status: AC | PRN
  Administered 2024-02-23: 1 mg via INTRAVENOUS
  Filled 2024-02-23: qty 1

## 2024-02-23 MED ORDER — ADULT MULTIVITAMIN W/MINERALS CH
1.0000 | ORAL_TABLET | Freq: Every day | ORAL | Status: DC
  Administered 2024-02-24 – 2024-02-25 (×2): 1 via ORAL
  Filled 2024-02-23 (×2): qty 1

## 2024-02-23 MED ORDER — POTASSIUM & SODIUM PHOSPHATES 280-160-250 MG PO PACK
1.0000 | PACK | Freq: Three times a day (TID) | ORAL | Status: AC
  Administered 2024-02-23 (×2): 1 via ORAL
  Filled 2024-02-23 (×2): qty 1

## 2024-02-23 MED ORDER — VITAMIN D (ERGOCALCIFEROL) 1.25 MG (50000 UNIT) PO CAPS
50000.0000 [IU] | ORAL_CAPSULE | ORAL | Status: DC
  Administered 2024-02-23: 50000 [IU] via ORAL
  Filled 2024-02-23: qty 1

## 2024-02-23 MED ORDER — FOLIC ACID 1 MG PO TABS
1.0000 mg | ORAL_TABLET | Freq: Every day | ORAL | Status: DC
  Administered 2024-02-23 – 2024-02-25 (×3): 1 mg via ORAL
  Filled 2024-02-23 (×3): qty 1

## 2024-02-23 MED ORDER — VITAMIN B-12 1000 MCG PO TABS: 1000.0000 ug | ORAL_TABLET | Freq: Every day | ORAL | Status: DC

## 2024-02-23 MED ORDER — THIAMINE HCL 100 MG PO TABS
100.0000 mg | ORAL_TABLET | Freq: Every day | ORAL | Status: DC
  Administered 2024-02-24 – 2024-02-25 (×2): 100 mg via ORAL
  Filled 2024-02-23 (×3): qty 1

## 2024-02-23 MED ORDER — SODIUM CHLORIDE 0.9 % IV SOLN: INTRAVENOUS | Status: AC

## 2024-02-23 MED ORDER — ENSURE ENLIVE PO LIQD
237.0000 mL | Freq: Three times a day (TID) | ORAL | Status: DC
  Administered 2024-02-23 – 2024-02-25 (×5): 237 mL via ORAL

## 2024-02-23 MED ORDER — HYDRALAZINE HCL 20 MG/ML IJ SOLN: 10.0000 mg | Freq: Four times a day (QID) | INTRAMUSCULAR | Status: DC | PRN

## 2024-02-23 MED ORDER — SULFAMETHOXAZOLE-TRIMETHOPRIM 400-80 MG PO TABS
1.0000 | ORAL_TABLET | Freq: Two times a day (BID) | ORAL | Status: DC
  Administered 2024-02-23 – 2024-02-25 (×4): 1 via ORAL
  Filled 2024-02-23 (×5): qty 1

## 2024-02-23 MED ORDER — CYANOCOBALAMIN 1000 MCG/ML IJ SOLN
1000.0000 ug | Freq: Every day | INTRAMUSCULAR | Status: DC
  Administered 2024-02-23 – 2024-02-25 (×3): 1000 ug via INTRAMUSCULAR
  Filled 2024-02-23 (×4): qty 1

## 2024-02-23 NOTE — Plan of Care (Signed)
  Problem: Education: Goal: Knowledge of General Education information will improve Description: Including pain rating scale, medication(s)/side effects and non-pharmacologic comfort measures Outcome: Progressing   Problem: Clinical Measurements: Goal: Ability to maintain clinical measurements within normal limits will improve Outcome: Progressing Goal: Will remain free from infection Outcome: Progressing Goal: Diagnostic test results will improve Outcome: Progressing Goal: Respiratory complications will improve Outcome: Progressing Goal: Cardiovascular complication will be avoided Outcome: Progressing   Problem: Activity: Goal: Risk for activity intolerance will decrease Outcome: Progressing   Problem: Nutrition: Goal: Adequate nutrition will be maintained Outcome: Progressing   Problem: Coping: Goal: Level of anxiety will decrease Outcome: Progressing   Problem: Elimination: Goal: Will not experience complications related to bowel motility Outcome: Progressing Goal: Will not experience complications related to urinary retention Outcome: Progressing   Problem: Pain Managment: Goal: General experience of comfort will improve and/or be controlled Outcome: Progressing   Problem: Safety: Goal: Ability to remain free from injury will improve Outcome: Progressing

## 2024-02-23 NOTE — Progress Notes (Addendum)
 Initial Nutrition Assessment  DOCUMENTATION CODES:   Severe malnutrition in context of chronic illness  INTERVENTION:   Ensure Plus High Protein po TID, each supplement provides 350 kcal and 20 grams of protein  Magic cup TID with meals, each supplement provides 290 kcal and 9 grams of protein  MVI po daily   Thiamine  100mg  po daily x 7 days  Liberalize diet   Pt at high refeed risk; recommend monitor potassium, magnesium  and phosphorus labs daily until stable  Daily weights   NUTRITION DIAGNOSIS:   Severe Malnutrition related to chronic illness as evidenced by severe fat depletion, severe muscle depletion.  GOAL:   Patient will meet greater than or equal to 90% of their needs  MONITOR:   PO intake, Supplement acceptance, Weight trends, Labs, Skin, I & O's  REASON FOR ASSESSMENT:   Malnutrition Screening Tool    ASSESSMENT:   68 y/o male with h/o CAD, CHF, BPH, HLD, CKD III, stroke, AAA, HTN, MI and IDA who is admited with urinary retention, UTI, sepsis and AKI.  Met with pt in room today. Pt reports good appetite and oral intake at baseline and in hospital; pt reports that he is eating 100% of meals. Pt reports that he has been drinking chocolate Ensure. RD discussed with pt the importance of adequate nutrition needed to preserve lean muscle. Pt is agreeable to continue supplements. RD will liberalize pt's diet. Pt reports constipation today. Per chart, pt is down ~44lbs(26%) over the past 2 months; this is severe weight loss. Pt reports that his UBW is ~150lbs.    Of note, pt with folic acid , B12 and vitamin D  deficiency; pt is receiving supplementation.   Medications reviewed and include: vitamin C , aspirin , dulcolax, B12, lovenox , folic acid , iron , nicotine , miralax , bactrim , vitamin D , NaCl @100ml /hr   Labs reviewed: Na 131(L), K 4.5 wnl, BUN 47(H), creat 2.16(H), P 2.1(L), Mg 2.1 wnl  Pro BNP- 33, 758(H)- 11/18 Iron  19(L), TIBC 185(L), folate < 3.0(L), vitamin  D 18.89(L), B12 160(L)- 11/19  NUTRITION - FOCUSED PHYSICAL EXAM:  Flowsheet Row Most Recent Value  Orbital Region Moderate depletion  Upper Arm Region Severe depletion  Thoracic and Lumbar Region Severe depletion  Buccal Region Moderate depletion  Temple Region Moderate depletion  Clavicle Bone Region Severe depletion  Clavicle and Acromion Bone Region Severe depletion  Scapular Bone Region Severe depletion  Dorsal Hand Severe depletion  Patellar Region Severe depletion  Anterior Thigh Region Severe depletion  Posterior Calf Region Severe depletion  Edema (RD Assessment) None  Hair Reviewed  Eyes Reviewed  Mouth Reviewed  Skin Reviewed  Nails Reviewed   Diet Order:   Diet Order             Diet Heart Room service appropriate? Yes; Fluid consistency: Thin  Diet effective now                  EDUCATION NEEDS:   Education needs have been addressed  Skin:  Skin Assessment: Reviewed RN Assessment  Last BM:  11/16  Height:   Ht Readings from Last 1 Encounters:  02/20/24 5' 5 (1.651 m)    Weight:   Wt Readings from Last 1 Encounters:  02/23/24 57 kg    Ideal Body Weight:  61.8 kg  BMI:  Body mass index is 20.92 kg/m.  Estimated Nutritional Needs:   Kcal:  1800-2100kcal/day  Protein:  90-105g/day  Fluid:  1.7-1.9L/day  Augustin Shams MS, RD, LDN If unable to be reached,  please send secure chat to RD inpatient available from 8:00a-4:00p daily

## 2024-02-23 NOTE — Plan of Care (Signed)

## 2024-02-23 NOTE — Progress Notes (Signed)
 Progress Note   Patient: Dennis Church FMW:[id number] DOB: 10/19/1955 DOA: 02/20/2024     3 DOS: the patient was seen and examined on 02/23/2024   Brief hospital course: Partly taken from H&P.  Dennis Church is a 68 y.o. male with medical history significant of BPH, with recurrent urinary retention, HTN, chronic HFrEF, LV mural thrombosis, AAA, multivessel CAD, CKD stage IIIa, cigarette smoking, presented with worsening of urinary retention.   Patient with history of recurrent urinary retention secondary to severe BPH, he was proposed surgical solution by urology which was declined by patient.  On presentation patient was febrile at 102, tachycardic, a Foley was placed in ED with removal of more than 1 L of cloudy urine.  UA concerning for UTI, AKI with BUN of 35 and creatinine of 2.3, baseline seems to be around 1.2-1.5.  Sodium of 130.  Patient was started on ceftriaxone .  History of prior pansensitive Klebsiella pneumonia UTI.  Urine cultures were obtained.  11/18: Vital stable, mild leukocytosis at 13.2, persistent mild hyponatremia at 131 which seems chronic, slight worsening of creatinine to 2.49. Starting him on IV fluid.  Pending urine cultures  Assessment and Plan:  # Sepsis secondary to UTI (HCC) Met sepsis criteria with fever, leukocytosis and tachycardia.  Likely secondary to UTI. Urine cultures pending. History of prior UTI with Klebsiella pneumonia which shows resistant to ampicillin and nitrofurantoin. - s/p ceftriaxone  -Urine Cx growing E. coli and Klebsiella 11/20 started Bactrim  twice daily as per sensitivity report  Urinary retention due to benign prostatic hyperplasia Patient with history of severe BPH and recurrent urinary retentions.  Urology has recommended surgical intervention but patient declined. Foley catheter was placed in ED. - Started on Flomax  -Will be going home with Foley in place and need to follow-up with his urologist. Urology consulted,  recommended to continue Foley catheter for monthly exchange as an outpatient until he goes under surgery.  Outpatient HoLEP with cystolitholapaxy with Dr. Francisca   AKI (acute kidney injury) Creatinine with some more worsening now at 2.42, baseline seems to be around 1.3-1.5 which is consistent with CKD stage III A.  Patient was also noncompliant with his medications, can have some progression of CKD. -Monitor renal function -Avoid nephrotoxins sCr 2.27>>2.16 gradually improving Continue IV fluid for gentle hydration  Chronic HFrEF (heart failure with reduced ejection fraction) (HCC) Echo done in January 2020 with EF of less than 20%, global hypokinesis and grade 1 diastolic dysfunction. Patient stopped taking his home torsemide , spironolactone , Entresto  and all other medications. Clinically appears euvolemic. - proBNP 33758 significantly elevated -Monitor volume status closely as patient is getting some IV fluid for AKI  Hypertension - Continue low-dose metoprolol  Use IV hydralazine  prn  Monitor BP and titrate medications according   History of CAD (coronary artery disease) No current chest pain. -Starting back on low-dose metoprolol  and statin. - Continue with aspirin   Continuous dependence on cigarette smoking Counseling was provided, patient does not have any interest in quitting at this time. - Nicotine  patch as needed  # Iron deficiency anemia, Tsat 10% Started oral iron supplement with vitamin C.  Follow-up with PCP to repeat iron profile after 3 to 6 months.  # Folic acid deficiency, folic acid level 30.0, started folic acid 1 mg p.o. daily.  Follow with PCP to repeat folic acid level after 3 to 6 months.  # Vitamin B12 deficiency: B12 level 160, goal >400 started vitamin B12 1000 mcg IM injection daily during hospital stay, followed  by oral supplement.  Follow-up PCP to repeat vitamin B12 level after 3 to 6 months.  # Vitamin D  deficiency: started vitamin D  50,000 units  p.o. weekly, follow with PCP to repeat vitamin D  level after 3 to 6 months.   # Constipation, continue laxative   Subjective: No significant events overnight.  In the morning time patient was restless and wanted to leave as per RN, Haldol  was given. During my rounds patient was resting calmly, discussed about management plan and he agreed to stay another day.  We will reevaluate tomorrow a.m. Patient denied any specific complaints, stated that he is feeling fine   Physical Exam: Vitals:   02/22/24 2026 02/23/24 0413 02/23/24 0847 02/23/24 1522  BP: 139/71 (!) 155/94 125/77 (!) 151/79  Pulse: 78 82 68 64  Resp: 16 16 18 18   Temp: 99.4 F (37.4 C) 98 F (36.7 C) 97.6 F (36.4 C) 97.7 F (36.5 C)  TempSrc: Oral     SpO2: 97% 97% 100% 100%  Weight:      Height:       General.  Frail and malnourished elderly man, in no acute distress. Pulmonary.  Lungs clear bilaterally, normal respiratory effort. CV.  Regular rate and rhythm, no JVD, rub or murmur. Abdomen.  Soft, nontender, nondistended, BS positive. CNS.  Alert and oriented .  No focal neurologic deficit. Extremities.  No edema, no cyanosis, pulses intact and symmetrical.  Data Reviewed: Prior data reviewed  Family Communication: Tried calling son and daughter listed in his chart with no response.  Disposition: Status is: Inpatient Remains inpatient appropriate because: Severity of illness  Planned Discharge Destination: Home  DVT prophylaxis.  Lovenox  Time spent: 55 minutes  This record has been created using Conservation officer, historic buildings. Errors have been sought and corrected,but may not always be located. Such creation errors do not reflect on the standard of care.   Author: Elvan Sor, MD 02/23/2024 3:43 PM  For on call review [email or web address].

## 2024-02-24 DIAGNOSIS — E43 Unspecified severe protein-calorie malnutrition: Secondary | ICD-10-CM | POA: Insufficient documentation

## 2024-02-24 DIAGNOSIS — A419 Sepsis, unspecified organism: Secondary | ICD-10-CM | POA: Diagnosis not present

## 2024-02-24 DIAGNOSIS — N390 Urinary tract infection, site not specified: Secondary | ICD-10-CM | POA: Diagnosis not present

## 2024-02-24 LAB — MAGNESIUM: Magnesium: 2 mg/dL (ref 1.7–2.4)

## 2024-02-24 LAB — BASIC METABOLIC PANEL WITH GFR
Anion gap: 9 (ref 5–15)
BUN: 49 mg/dL — ABNORMAL HIGH (ref 8–23)
CO2: 21 mmol/L — ABNORMAL LOW (ref 22–32)
Calcium: 8.7 mg/dL — ABNORMAL LOW (ref 8.9–10.3)
Chloride: 100 mmol/L (ref 98–111)
Creatinine, Ser: 2.04 mg/dL — ABNORMAL HIGH (ref 0.61–1.24)
GFR, Estimated: 34 mL/min — ABNORMAL LOW (ref >60)
Glucose, Bld: 102 mg/dL — ABNORMAL HIGH (ref 70–99)
Potassium: 4.7 mmol/L (ref 3.5–5.1)
Sodium: 130 mmol/L — ABNORMAL LOW (ref 135–145)

## 2024-02-24 LAB — CBC
HCT: 32.1 % — ABNORMAL LOW (ref 39.0–52.0)
Hemoglobin: 11 g/dL — ABNORMAL LOW (ref 13.0–17.0)
MCH: 29.7 pg (ref 26.0–34.0)
MCHC: 34.3 g/dL (ref 30.0–36.0)
MCV: 86.8 fL (ref 80.0–100.0)
Platelets: 235 K/uL (ref 150–400)
RBC: 3.7 MIL/uL — ABNORMAL LOW (ref 4.22–5.81)
RDW: 15.9 % — ABNORMAL HIGH (ref 11.5–15.5)
WBC: 6.2 K/uL (ref 4.0–10.5)
nRBC: 0 % (ref 0.0–0.2)

## 2024-02-24 LAB — PHOSPHORUS: Phosphorus: 2.4 mg/dL — ABNORMAL LOW (ref 2.5–4.6)

## 2024-02-24 LAB — OSMOLALITY: Osmolality: 287 mosm/kg (ref 275–295)

## 2024-02-24 MED ORDER — SODIUM CHLORIDE 0.9 % IV SOLN: INTRAVENOUS | Status: AC

## 2024-02-24 NOTE — Plan of Care (Signed)
   Problem: Education: Goal: Knowledge of General Education information will improve Description: Including pain rating scale, medication(s)/side effects and non-pharmacologic comfort measures Outcome: Progressing   Problem: Clinical Measurements: Goal: Ability to maintain clinical measurements within normal limits will improve Outcome: Progressing

## 2024-02-24 NOTE — Plan of Care (Signed)
 Alert with some confusion to time and situation. Able to reorient and redirect.  A few times this shift Dennis Church observed with indwelling catheter bag in the bed with him and education provided. New cath secure placed to left leg as Dennis Church removed it stating it was his responsibility to empty the foley bag and removing the cath secure was apart of emptying the bag. Reeducation provided as increased confusion and restlessness observed as the night progressed though pleasant. Indwelling cath draining clear yellow urine without complication below bladder. Problem: Safety: Goal: Ability to remain free from injury will improve Outcome: Progressing   Problem: Elimination: Goal: Will not experience complications related to urinary retention Outcome: Progressing   Problem: Elimination: Goal: Will not experience complications related to bowel motility Outcome: Progressing   Problem: Coping: Goal: Level of anxiety will decrease Outcome: Progressing

## 2024-02-24 NOTE — TOC Progression Note (Signed)
 Transition of Care [facility]) - Progression Note    Patient Details  Name: Dennis Church MRN: [id number] Date of Birth: Jun 06, 1955  Transition of Care [facility]) CM/SW Contact  Corean ONEIDA Haddock, RN Phone Number: 02/24/2024, 3:31 PM  Clinical Narrative:     Met with patient at bedside,to follow up with [facility].  Patiently politely declines and states that he does not feel that it is indicated   Expected Discharge Plan: Home w Home Health Services Barriers to Discharge: Continued Medical Work up               Expected Discharge Plan and Services       Living arrangements for the past 2 months: Single Family Home                                       Social Drivers of Health (SDOH) Interventions SDOH Screenings   Food Insecurity: No Food Insecurity (02/20/2024)  Housing: Unknown (02/20/2024)  Transportation Needs: No Transportation Needs (02/20/2024)  Utilities: Not At Risk (02/20/2024)  Social Connections: Socially Isolated (02/20/2024)  Tobacco Use: High Risk (02/20/2024)    Readmission Risk Interventions     No data to display

## 2024-02-24 NOTE — Plan of Care (Signed)
°  Problem: Education: Goal: Knowledge of General Education information will improve Description: Including pain rating scale, medication(s)/side effects and non-pharmacologic comfort measures Outcome: Progressing   Problem: Clinical Measurements: Goal: Ability to maintain clinical measurements within normal limits will improve Outcome: Progressing Goal: Will remain free from infection Outcome: Progressing Goal: Diagnostic test results will improve Outcome: Progressing Goal: Respiratory complications will improve Outcome: Progressing Goal: Cardiovascular complication will be avoided Outcome: Progressing   Problem: Activity: Goal: Risk for activity intolerance will decrease Outcome: Progressing   Problem: Elimination: Goal: Will not experience complications related to bowel motility Outcome: Progressing Goal: Will not experience complications related to urinary retention Outcome: Progressing   Problem: Safety: Goal: Ability to remain free from injury will improve Outcome: Progressing

## 2024-02-24 NOTE — Progress Notes (Signed)
 Progress Note   Patient: Dennis Church FMW:[id number] DOB: 1956/03/28 DOA: 02/20/2024     4 DOS: the patient was seen and examined on 02/24/2024   Brief hospital course: Partly taken from H&P.  LULA MICHAUX is a 68 y.o. male with medical history significant of BPH, with recurrent urinary retention, HTN, chronic HFrEF, LV mural thrombosis, AAA, multivessel CAD, CKD stage IIIa, cigarette smoking, presented with worsening of urinary retention.   Patient with history of recurrent urinary retention secondary to severe BPH, he was proposed surgical solution by urology which was declined by patient.  On presentation patient was febrile at 102, tachycardic, a Foley was placed in ED with removal of more than 1 L of cloudy urine.  UA concerning for UTI, AKI with BUN of 35 and creatinine of 2.3, baseline seems to be around 1.2-1.5.  Sodium of 130.  Patient was started on ceftriaxone .  History of prior pansensitive Klebsiella pneumonia UTI.  Urine cultures were obtained.  11/18: Vital stable, mild leukocytosis at 13.2, persistent mild hyponatremia at 131 which seems chronic, slight worsening of creatinine to 2.49. Starting him on IV fluid.  Pending urine cultures  Assessment and Plan:  # Sepsis secondary to UTI (HCC) Met sepsis criteria with fever, leukocytosis and tachycardia.  Likely secondary to UTI. Urine cultures pending. History of prior UTI with Klebsiella pneumonia which shows resistant to ampicillin and nitrofurantoin. - s/p ceftriaxone  -Urine Cx growing E. coli and Klebsiella 11/20 started Bactrim  twice daily as per sensitivity report   Urinary retention due to benign prostatic hyperplasia Patient with history of severe BPH and recurrent urinary retentions.  Urology has recommended surgical intervention but patient declined. Foley catheter was placed in ED. - Started on Flomax  -Will be going home with Foley in place and need to follow-up with his urologist. Urology consulted,  recommended to continue Foley catheter for monthly exchange as an outpatient until he goes under surgery.  Outpatient HoLEP with cystolitholapaxy with Dr. Francisca   AKI (acute kidney injury) Creatinine with some more worsening now at 2.42, baseline seems to be around 1.3-1.5 which is consistent with CKD stage III A.  Patient was also noncompliant with his medications, can have some progression of CKD. -Monitor renal function -Avoid nephrotoxins sCr 2.27>>2.16>2.04 gradually improving Continue IV fluid for gentle hydration  # Isotonic hyponatremia, most likely due to nutritional deficiency  Serum osmolality 287, WNL monitor sodium level daily # Hypophosphatemia, Phos repleted Monitor electrolytes daily and replete as needed.  Chronic HFrEF (heart failure with reduced ejection fraction) (HCC) Echo done in January 2020 with EF of less than 20%, global hypokinesis and grade 1 diastolic dysfunction. Patient stopped taking his home torsemide , spironolactone , Entresto  and all other medications. Clinically appears euvolemic. - proBNP 33758 significantly elevated -Monitor volume status closely as patient is getting some IV fluid for AKI  Hypertension - Continue low-dose metoprolol  Use IV hydralazine  prn  Monitor BP and titrate medications according   History of CAD (coronary artery disease) No current chest pain. -Starting back on low-dose metoprolol  and statin. - Continue with aspirin   Continuous dependence on cigarette smoking Counseling was provided, patient does not have any interest in quitting at this time. - Nicotine  patch as needed  # Iron  deficiency anemia, Tsat 10% Started oral iron  supplement with vitamin C .  Follow-up with PCP to repeat iron  profile after 3 to 6 months.  # Folic acid  deficiency, folic acid  level 30.0, started folic acid  1 mg p.o. daily.  Follow with PCP to  repeat folic acid  level after 3 to 6 months.  # Vitamin B12 deficiency: B12 level 160, goal >400  started vitamin B12 1000 mcg IM injection daily during hospital stay, followed by oral supplement.  Follow-up PCP to repeat vitamin B12 level after 3 to 6 months.  # Vitamin D  deficiency: started vitamin D  50,000 units p.o. weekly, follow with PCP to repeat vitamin D  level after 3 to 6 months.   # Constipation, continue laxative   Subjective: No significant events overnight.  Patient was laying comfortably in the bed, denied any complaints.  No chest pain or palpitation, no shortness of breath. Patient understand that he is still not good to go home, willing to stay and get treated. Patient does not want home health set up.   Physical Exam: Vitals:   02/23/24 2011 02/24/24 0359 02/24/24 0400 02/24/24 0856  BP: (!) 150/75  128/60 139/87  Pulse: 71  64 74  Resp: 19  17 18   Temp: 98 F (36.7 C)  98.1 F (36.7 C) 99.4 F (37.4 C)  TempSrc: Oral  Oral   SpO2: 100%  99% 98%  Weight:  55.3 kg    Height:       General.  Frail and malnourished elderly man, in no acute distress. Pulmonary.  Lungs clear bilaterally, normal respiratory effort. CV.  Regular rate and rhythm, no JVD, rub or murmur. Abdomen.  Soft, nontender, nondistended, BS positive. CNS.  Alert and oriented .  No focal neurologic deficit. Extremities.  No edema, no cyanosis, pulses intact and symmetrical.  Data Reviewed: Prior data reviewed  Family Communication: Tried calling son and daughter listed in his chart with no response.  Disposition: Status is: Inpatient Remains inpatient appropriate because: Severity of illness  Planned Discharge Destination: Home  DVT prophylaxis.  Lovenox  Time spent: 55 minutes  This record has been created using Conservation officer, historic buildings. Errors have been sought and corrected,but may not always be located. Such creation errors do not reflect on the standard of care.   Author: Elvan Sor, MD 02/24/2024 3:18 PM  For on call review [email or web address].

## 2024-02-24 NOTE — Progress Notes (Signed)
 Mobility Specialist - Progress Note   02/24/24 1105  Mobility  Activity Ambulated with assistance  Level of Assistance Contact guard assist, steadying assist  Assistive Device Front wheel walker  Distance Ambulated (ft) 180 ft  Activity Response Tolerated well  Mobility visit 1 Mobility  Mobility Specialist Start Time (ACUTE ONLY) 1048  Mobility Specialist Stop Time (ACUTE ONLY) 1101  Mobility Specialist Time Calculation (min) (ACUTE ONLY) 13 min   Pt supine upon entry, utilizing RA. Pt amb one lap around the NS CGA, tolerated well-- denied fatigue. Pt had one Lob upon return to the room when turing the corner, CGA for correction. Pt left supine with alarm set and needs within reach.  America Silvan Mobility Specialist 02/24/24 11:23 AM

## 2024-02-24 NOTE — Plan of Care (Signed)
   Problem: Education: Goal: Knowledge of General Education information will improve Description Including pain rating scale, medication(s)/side effects and non-pharmacologic comfort measures Outcome: Progressing

## 2024-02-25 ENCOUNTER — Other Ambulatory Visit: Payer: Self-pay

## 2024-02-25 DIAGNOSIS — N390 Urinary tract infection, site not specified: Secondary | ICD-10-CM | POA: Diagnosis not present

## 2024-02-25 DIAGNOSIS — A419 Sepsis, unspecified organism: Secondary | ICD-10-CM | POA: Diagnosis not present

## 2024-02-25 LAB — BASIC METABOLIC PANEL WITH GFR
Anion gap: 9 (ref 5–15)
BUN: 43 mg/dL — ABNORMAL HIGH (ref 8–23)
CO2: 23 mmol/L (ref 22–32)
Calcium: 8.8 mg/dL — ABNORMAL LOW (ref 8.9–10.3)
Chloride: 99 mmol/L (ref 98–111)
Creatinine, Ser: 1.89 mg/dL — ABNORMAL HIGH (ref 0.61–1.24)
GFR, Estimated: 38 mL/min — ABNORMAL LOW (ref >60)
Glucose, Bld: 101 mg/dL — ABNORMAL HIGH (ref 70–99)
Potassium: 5 mmol/L (ref 3.5–5.1)
Sodium: 132 mmol/L — ABNORMAL LOW (ref 135–145)

## 2024-02-25 LAB — CBC
HCT: 33.7 % — ABNORMAL LOW (ref 39.0–52.0)
Hemoglobin: 11.4 g/dL — ABNORMAL LOW (ref 13.0–17.0)
MCH: 29.8 pg (ref 26.0–34.0)
MCHC: 33.8 g/dL (ref 30.0–36.0)
MCV: 88.2 fL (ref 80.0–100.0)
Platelets: 246 K/uL (ref 150–400)
RBC: 3.82 MIL/uL — ABNORMAL LOW (ref 4.22–5.81)
RDW: 16.3 % — ABNORMAL HIGH (ref 11.5–15.5)
WBC: 6.2 K/uL (ref 4.0–10.5)
nRBC: 0 % (ref 0.0–0.2)

## 2024-02-25 LAB — MAGNESIUM: Magnesium: 2.1 mg/dL (ref 1.7–2.4)

## 2024-02-25 LAB — PHOSPHORUS: Phosphorus: 2.4 mg/dL — ABNORMAL LOW (ref 2.5–4.6)

## 2024-02-25 MED ORDER — VITAMIN D (ERGOCALCIFEROL) 1.25 MG (50000 UNIT) PO CAPS
50000.0000 [IU] | ORAL_CAPSULE | ORAL | 0 refills | Status: AC
  Filled 2024-02-25: qty 12, 84d supply, fill #0

## 2024-02-25 MED ORDER — FOLIC ACID 1 MG PO TABS
1.0000 mg | ORAL_TABLET | Freq: Every day | ORAL | 1 refills | Status: AC
  Filled 2024-02-25: qty 90, 90d supply, fill #0

## 2024-02-25 MED ORDER — SULFAMETHOXAZOLE-TRIMETHOPRIM 400-80 MG PO TABS
1.0000 | ORAL_TABLET | Freq: Two times a day (BID) | ORAL | 0 refills | Status: AC
  Filled 2024-02-25: qty 10, 5d supply, fill #0

## 2024-02-25 MED ORDER — TAMSULOSIN HCL 0.4 MG PO CAPS
0.4000 mg | ORAL_CAPSULE | Freq: Every day | ORAL | 11 refills | Status: AC
  Filled 2024-02-25: qty 30, 30d supply, fill #0

## 2024-02-25 MED ORDER — LOSARTAN POTASSIUM 25 MG PO TABS
25.0000 mg | ORAL_TABLET | Freq: Every evening | ORAL | 11 refills | Status: AC
  Filled 2024-02-25: qty 30, 30d supply, fill #0

## 2024-02-25 MED ORDER — ASPIRIN 81 MG PO TBEC
81.0000 mg | DELAYED_RELEASE_TABLET | Freq: Every day | ORAL | 12 refills | Status: AC
  Filled 2024-02-25: qty 30, 30d supply, fill #0

## 2024-02-25 MED ORDER — SODIUM CHLORIDE 0.9 % IV SOLN: INTRAVENOUS | Status: DC

## 2024-02-25 MED ORDER — POLYSACCHARIDE IRON COMPLEX 150 MG PO CAPS
150.0000 mg | ORAL_CAPSULE | Freq: Every day | ORAL | 1 refills | Status: AC
  Filled 2024-02-25: qty 90, 90d supply, fill #0

## 2024-02-25 MED ORDER — CYANOCOBALAMIN 1000 MCG PO TABS
1000.0000 ug | ORAL_TABLET | Freq: Every day | ORAL | 1 refills | Status: AC
  Filled 2024-02-25: qty 90, 90d supply, fill #0

## 2024-02-25 MED ORDER — METOPROLOL SUCCINATE ER 25 MG PO TB24
25.0000 mg | ORAL_TABLET | Freq: Every day | ORAL | 11 refills | Status: AC
  Filled 2024-02-25: qty 30, 30d supply, fill #0

## 2024-02-25 MED ORDER — ATORVASTATIN CALCIUM 80 MG PO TABS
80.0000 mg | ORAL_TABLET | Freq: Every evening | ORAL | 11 refills | Status: AC
  Filled 2024-02-25: qty 30, 30d supply, fill #0

## 2024-02-25 MED ORDER — ASCORBIC ACID 500 MG PO TABS
500.0000 mg | ORAL_TABLET | Freq: Every day | ORAL | 1 refills | Status: AC
  Filled 2024-02-25: qty 90, 90d supply, fill #0

## 2024-02-25 NOTE — TOC Transition Note (Addendum)
 Transition of Care [facility]) - Discharge Note   Patient Details  Name: Dennis Church MRN: [id number] Date of Birth: 05/16/1955  Transition of Care [facility]) CM/SW Contact:  Maghan Jessee E Chesley Valls, LCSW Phone Number: 02/25/2024, 2:01 PM   Clinical Narrative:    Patient is discharging today. Declined home health. PCP resource list added to patient's AVS.   Per MD, questionable if patient can care for himself due to not bathing/changing clothes, and unsure if he has food.   CSW called patient's son Sigmond Patalano. with patient's permission. Patient's son states he lives about an hour away from patient and can check on him as needed. Son states patient has food at home and has a few neighbors and friends who can provide support locally. Son is aware of patient declining home health and is ok with this. Patient has a daughter but she is out of state in [place] . Son states patient would not want to do OPPT either. Son feels ok with patient discharging home today.   CSW also spoke with patient. Patient alert and oriented x 4. Patient confirms he does not want home health or OPPT. Patient states he already has a rolling walker at home. States he will drive himself home today. States he has everything he needs at home including food, clothing, and all basic needs are met.   Spoke with On Call ICM Leader who confirms no APS report warranted.    Final next level of care: Home w Home Health Services Barriers to Discharge: Barriers Resolved   Patient Goals and CMS Choice Patient states their goals for this hospitalization and ongoing recovery are:: To continue to be independent          Discharge Placement                       Discharge Plan and Services Additional resources added to the After Visit Summary for                                       Social Drivers of Health (SDOH) Interventions SDOH Screenings   Food Insecurity: No Food Insecurity (02/20/2024)  Housing: Unknown  (02/20/2024)  Transportation Needs: No Transportation Needs (02/20/2024)  Utilities: Not At Risk (02/20/2024)  Social Connections: Socially Isolated (02/20/2024)  Tobacco Use: High Risk (02/20/2024)     Readmission Risk Interventions     No data to display

## 2024-02-25 NOTE — Discharge Summary (Signed)
 [facility] Discharge Summary   Patient: Dennis Church FMW:[id number]  PCP: Patient, No Pcp Per  Date of admission: 02/20/2024   Date of discharge:  02/25/2024     Discharge Diagnoses:  Principal Problem:   Sepsis secondary to UTI [facility]) Active Problems:   Urinary retention due to benign prostatic hyperplasia   AKI (acute kidney injury)   Chronic HFrEF (heart failure with reduced ejection fraction) (HCC)   Hypertension   History of CAD (coronary artery disease)   Continuous dependence on cigarette smoking   Sepsis (HCC)   Protein-calorie malnutrition, severe   Admitted From: Home Disposition:  Home, declined home health services Patient has very poor understanding of his underlying medical condition and does not understand well about treatment plan. Following was recommended on discharge.  Recommendations for Outpatient Follow-up:  Follow-up with PCP in 1 week Follow-up with urology in 1 week Follow-up with cardiology in 1 week Repeat CBC, BMP in 1 to 2 weeks Repeat iron  profile, folic acid  level, B12 level and vitamin D  level in between 3 to 6 months. Follow up LABS/TEST:  as above   Follow-up Information     Gollan, Timothy J, MD Follow up in 1 week(s).   Specialty: Cardiology Why: Cardiac clearance for prostate surgery as per urology.  Thanks Contact information: [address] [place] [address] [phone]         PCP Follow up in 1 week(s).          Francisca Redell BROCKS, MD Follow up in 1 week(s).   Specialty: Urology Contact information: [address] [place] [address] [phone]         Dewane Shiner, DO Follow up in 1 week(s).   Specialty: Cardiology Contact information: [address] [place] [address] [phone]                Diet recommendation: Cardiac diet  Activity: The patient is advised to gradually reintroduce usual activities, as tolerated  Discharge Condition:  stable  Code Status: Full code   History of present illness: As per the H and P dictated on admission.  Hospital Course:  Partly taken from H&P.   HERVE HAUG is a 68 y.o. male with medical history significant of BPH, with recurrent urinary retention, HTN, chronic HFrEF, LV mural thrombosis, AAA, multivessel CAD, CKD stage IIIa, cigarette smoking, presented with worsening of urinary retention.    Patient with history of recurrent urinary retention secondary to severe BPH, he was proposed surgical solution by urology which was declined by patient.   On presentation patient was febrile at 102, tachycardic, a Foley was placed in ED with removal of more than 1 L of cloudy urine.  UA concerning for UTI, AKI with BUN of 35 and creatinine of 2.3, baseline seems to be around 1.2-1.5.  Sodium of 130.   Patient was started on ceftriaxone .  History of prior pansensitive Klebsiella pneumonia UTI.  Urine cultures were obtained.    Assessment and Plan:   # Sepsis secondary to UTI (HCC) Met sepsis criteria with fever, leukocytosis and tachycardia.  Likely secondary to UTI. History of prior UTI with Klebsiella pneumonia which shows resistant to ampicillin and nitrofurantoin. - s/p ceftriaxone  -Urine Cx growing E. coli and Klebsiella 11/20 started Bactrim  twice daily as per sensitivity report.  Patient was discharged on Bactrim  twice daily for 5 more days due to high risk secondary to indwelling Foley catheter.     # Urinary retention  due to benign prostatic hyperplasia Patient with history of severe BPH and recurrent urinary retentions.  Urology has recommended surgical intervention but patient declined. Foley catheter was placed in ED. - Started on Flomax .  Patient was discharged with indwelling Foley catheter. Urology consulted, recommended to continue Foley catheter for monthly exchange as an outpatient until he goes under surgery.  Outpatient HoLEP with cystolitholapaxy with Dr. Francisca    #  AKI (acute kidney injury) Creatinine with some more worsening now at 2.42, baseline seems to be around 1.3-1.5 which is consistent with CKD stage III A.  Patient was also noncompliant with his medications, can have some progression of CKD. sCr 2.27>>2.16>1.89 gradually improving, s/p IVF, recommended to continue oral hydration at home   # Isotonic hyponatremia, most likely due to nutritional deficiency. Serum osmolality 287, WNL. Na 132 administered  # Hypophosphatemia, Phos repleted orally before discharge   # Chronic systolic CHF.  Currently euvolemic Echo done in January 2020 with EF of less than 20%, global hypokinesis and grade 1 diastolic dysfunction. Patient stopped taking his home torsemide , spironolactone , Entresto  and all other medications. - proBNP [id number] significantly elevated S/p IVF given due to AKI, tolerated well.  No signs of volume overload noticed. Resumed home meds losartan  25 mg p.o. daily, Toprol -XL 25 mg p.o. daily.  Patient does not take any other medications at home.  Recommended to follow-up with PCP and cardiology as an outpatient    # Hypertension: Resumed Toprol -XL and losartan  home dose on discharge.  Patient was advised to monitor BP at home and follow with PCP to titrate medication accordingly   # History of CAD (coronary artery disease) No current chest pain.  Resumed aspirin , Toprol -XL and statin.  # Continuous dependence on cigarette smoking Counseling was provided, patient does not have any interest in quitting at this time. - Nicotine  patch as needed   # Iron  deficiency anemia, Tsat 10% Started oral iron  supplement with vitamin C .  Follow-up with PCP to repeat iron  profile after 3 to 6 months.   # Folic acid  deficiency, folic acid  level 30.0, started folic acid  1 mg p.o. daily.  Follow with PCP to repeat folic acid  level after 3 to 6 months.   # Vitamin B12 deficiency: B12 level 160, goal >400 started vitamin B12 1000 mcg IM injection daily during  hospital stay, followed by oral supplement.  Follow-up PCP to repeat vitamin B12 level after 3 to 6 months.   # Vitamin D  deficiency: started vitamin D  50,000 units p.o. weekly, follow with PCP to repeat vitamin D  level after 3 to 6 months.   # Constipation, continue laxative  # Prior history of LV thrombus  Patient is not aware, stated that he is not on any blood thinner, never heard about Eliquis .  Home medications shows Eliquis  but patient never took it, and he said I will not take it so I discontinued Eliquis .  Recommended to follow with cardiology as an outpatient in 1 week.  Patient is very noncompliant, he stopped taking medications by himself.   Body mass index is 20.29 kg/m.  Nutrition Problem: Severe Malnutrition Etiology: chronic illness Nutrition Interventions:   Patient was ambulatory without any assistance.  On the day of the discharge the patient's vitals were stable, and no other acute medical condition were reported by patient. the patient was felt safe to be discharge at Home, patient declined home health.  Consultants: Urology Procedures: None  Discharge Exam: General: Appear in no distress, Oral Mucosa Clear, moist.  Cardiovascular: S1 and S2 Present, no Murmur, Respiratory: normal respiratory effort, Bilateral Air entry present and no Crackles, no wheezes Abdomen: Bowel Sound present, Soft and no tenderness. Extremities: no Pedal edema, no calf tenderness Neurology: alert and oriented to time, place, and person affect appropriate.  Filed Weights   02/23/24 1608 02/24/24 0359 02/25/24 0500  Weight: 57 kg 55.3 kg 55.3 kg   Vitals:   02/25/24 0844 02/25/24 0844  BP: (!) 141/82 (!) 141/82  Pulse: 74 75  Resp: 18 18  Temp: (!) 97.5 F (36.4 C) (!) 97.5 F (36.4 C)  SpO2: 99% 99%    DISCHARGE MEDICATION: Allergies as of 02/25/2024   No Known Allergies      Medication List     PAUSE taking these medications    isosorbide  mononitrate 60 MG 24 hr  tablet Wait to take this until your doctor or other care provider tells you to start again. Commonly known as: IMDUR  Take 60 mg by mouth daily.   spironolactone  25 MG tablet Wait to take this until your doctor or other care provider tells you to start again. Commonly known as: ALDACTONE  Take 1 tablet (25 mg total) by mouth daily.   torsemide  20 MG tablet Wait to take this until your doctor or other care provider tells you to start again. Commonly known as: DEMADEX  Take 1 tablet (20 mg total) by mouth daily.       STOP taking these medications    apixaban  5 MG Tabs tablet Commonly known as: ELIQUIS    cephALEXin  500 MG capsule Commonly known as: KEFLEX    nitroGLYCERIN  0.4 MG SL tablet Commonly known as: NITROSTAT        TAKE these medications    ascorbic acid  500 MG tablet Commonly known as: VITAMIN C  Take 1 tablet (500 mg total) by mouth daily. Start taking on: February 26, 2024   aspirin  EC 81 MG tablet Take 1 tablet (81 mg total) by mouth daily. Swallow whole. Start taking on: February 26, 2024   atorvastatin  80 MG tablet Commonly known as: LIPITOR Take 1 tablet (80 mg total) by mouth every evening.   cyanocobalamin  1000 MCG tablet Take 1 tablet (1,000 mcg total) by mouth daily. Start taking on: March 01, 2024   feeding supplement Liqd Take 237 mLs by mouth 2 (two) times daily between meals.   folic acid  1 MG tablet Commonly known as: FOLVITE  Take 1 tablet (1 mg total) by mouth daily. Start taking on: February 26, 2024   iron  polysaccharides 150 MG capsule Commonly known as: NIFEREX Take 1 capsule (150 mg total) by mouth daily. Start taking on: February 26, 2024   losartan  25 MG tablet Commonly known as: COZAAR  Take 1 tablet (25 mg total) by mouth every evening. Skip the dose if systolic BP less than 120 mmHg What changed:  medication strength how much to take when to take this additional instructions   metoprolol  succinate 25 MG 24 hr  tablet Commonly known as: TOPROL -XL Take 1 tablet (25 mg total) by mouth daily.   nicotine  21 mg/24hr patch Commonly known as: NICODERM CQ  - dosed in mg/24 hours Place 1 patch (21 mg total) onto the skin daily.   sulfamethoxazole -trimethoprim  400-80 MG tablet Commonly known as: BACTRIM  Take 1 tablet by mouth every 12 (twelve) hours for 5 days.   tamsulosin  0.4 MG Caps capsule Commonly known as: FLOMAX  Take 1 capsule (0.4 mg total) by mouth daily.   Vitamin D  (Ergocalciferol ) 1.25 MG (50000 UNIT) Caps capsule  Commonly known as: DRISDOL  Take 1 capsule (50,000 Units total) by mouth every 7 (seven) days. Start taking on: March 01, 2024       No Known Allergies Discharge Instructions     Call MD for:  difficulty breathing, headache or visual disturbances   Complete by: As directed    Call MD for:  extreme fatigue   Complete by: As directed    Call MD for:  persistant dizziness or light-headedness   Complete by: As directed    Call MD for:  persistant nausea and vomiting   Complete by: As directed    Call MD for:  severe uncontrolled pain   Complete by: As directed    Call MD for:  temperature >100.4   Complete by: As directed    Diet - low sodium heart healthy   Complete by: As directed    Discharge instructions   Complete by: As directed    Follow-up with PCP in 1 week Follow-up with urology in 1 week Follow-up with cardiology in 1 week Repeat CBC, BMP in 1 to 2 weeks Repeat iron  profile, folic acid  level, B12 level and vitamin D  level in between 3 to 6 months.   Increase activity slowly   Complete by: As directed        The results of significant diagnostics from this hospitalization (including imaging, microbiology, ancillary and laboratory) are listed below for reference.    Significant Diagnostic Studies: US  RENAL Result Date: 02/21/2024 CLINICAL DATA:  Acute kidney injury. EXAM: RENAL / URINARY TRACT ULTRASOUND COMPLETE COMPARISON:  None Available.  FINDINGS: Right Kidney: Renal measurements: 11.5 cm x 4.0 cm x 4.8 cm = volume: 117 mL. There is diffusely increased echogenicity of the renal parenchyma. No mass or hydronephrosis visualized. Left Kidney: Renal measurements: 9.6 cm x 4.7 cm x 6.0 cm = volume: 140 mL. There is diffusely increased echogenicity of the renal parenchyma. No mass or hydronephrosis visualized. Bladder: A Foley catheter is in place. Diffuse urinary bladder wall thickening and right-sided urinary bladder diverticula are seen. A 2.3 cm urinary bladder calculus is also noted. Other: The prostate gland measures 4.8 cm x 6.2 cm x 4.6 cm (volume 71.99 mL). Of incidental note is the presence of a known 5.7 cm infrarenal abdominal aortic aneurysm. IMPRESSION: 1. Bilateral echogenic kidneys likely secondary to sequelae associated with medical renal disease. 2. Urinary bladder wall thickening with right-sided urinary bladder diverticula and a 2.3 cm urinary bladder calculus. Further evaluation with urinalysis is recommended as acute cystitis cannot be excluded. 3. Prostatomegaly.  Correlation with PSA levels is recommended. 4. Infrarenal abdominal aortic aneurysm. Electronically Signed   By: Suzen Dials M.D.   On: 02/21/2024 22:57   DG Chest 1 View Result Date: 02/21/2024 CLINICAL DATA:  Congestive heart failure EXAM: CHEST  1 VIEW COMPARISON:  04/25/2023 FINDINGS: Emphysema. Coronary stent noted. Upper zone pulmonary vascular prominence could reflect pulmonary venous hypertension but there is no overt cardiomegaly at this time. No blunting of the costophrenic angles. No discrete airspace opacity. IMPRESSION: 1. Upper zone pulmonary vascular prominence could reflect pulmonary venous hypertension but there is no overt cardiomegaly at this time. 2.  Emphysema (ICD10-J43.9). 3. Coronary stent noted. Electronically Signed   By: Ryan Salvage M.D.   On: 02/21/2024 08:28    Microbiology: Recent Results (from the past 240 hours)  Urine  Culture     Status: Abnormal   Collection Time: 02/20/24 11:41 AM   Specimen: Urine, Catheterized  Result Value Ref  Range Status   Specimen Description   Final    URINE, CATHETERIZED Performed at [facility], [address], [place], [place] [address]    Special Requests   Final    NONE Reflexed from [id number] Performed at [facility], [address] [place], [place] [address]    Culture (A)  Final    >=100,000 COLONIES/mL ESCHERICHIA COLI >=100,000 COLONIES/mL KLEBSIELLA PNEUMONIAE    Report Status 02/23/2024 FINAL  Final   Organism ID, Bacteria ESCHERICHIA COLI (A)  Final   Organism ID, Bacteria KLEBSIELLA PNEUMONIAE (A)  Final      Susceptibility   Escherichia coli - MIC*    AMPICILLIN <=2 SENSITIVE Sensitive     CEFAZOLIN (URINE) Value in next row Sensitive      <=1 SENSITIVEThis is a modified FDA-approved test that has been validated and its performance characteristics determined by the reporting laboratory.  This laboratory is certified under the Clinical Laboratory Improvement Amendments CLIA as qualified to perform high complexity clinical laboratory testing.    CEFEPIME Value in next row Sensitive      <=1 SENSITIVEThis is a modified FDA-approved test that has been validated and its performance characteristics determined by the reporting laboratory.  This laboratory is certified under the Clinical Laboratory Improvement Amendments CLIA as qualified to perform high complexity clinical laboratory testing.    ERTAPENEM Value in next row Sensitive      <=1 SENSITIVEThis is a modified FDA-approved test that has been validated and its performance characteristics determined by the reporting laboratory.  This laboratory is certified under the Clinical Laboratory Improvement Amendments CLIA as qualified to perform high complexity clinical laboratory testing.    CEFTRIAXONE  Value in next row Sensitive      <=1 SENSITIVEThis is a modified FDA-approved test that has been  validated and its performance characteristics determined by the reporting laboratory.  This laboratory is certified under the Clinical Laboratory Improvement Amendments CLIA as qualified to perform high complexity clinical laboratory testing.    CIPROFLOXACIN Value in next row Sensitive      <=1 SENSITIVEThis is a modified FDA-approved test that has been validated and its performance characteristics determined by the reporting laboratory.  This laboratory is certified under the Clinical Laboratory Improvement Amendments CLIA as qualified to perform high complexity clinical laboratory testing.    GENTAMICIN Value in next row Sensitive      <=1 SENSITIVEThis is a modified FDA-approved test that has been validated and its performance characteristics determined by the reporting laboratory.  This laboratory is certified under the Clinical Laboratory Improvement Amendments CLIA as qualified to perform high complexity clinical laboratory testing.    NITROFURANTOIN Value in next row Sensitive      <=1 SENSITIVEThis is a modified FDA-approved test that has been validated and its performance characteristics determined by the reporting laboratory.  This laboratory is certified under the Clinical Laboratory Improvement Amendments CLIA as qualified to perform high complexity clinical laboratory testing.    TRIMETH /SULFA  Value in next row Sensitive      <=1 SENSITIVEThis is a modified FDA-approved test that has been validated and its performance characteristics determined by the reporting laboratory.  This laboratory is certified under the Clinical Laboratory Improvement Amendments CLIA as qualified to perform high complexity clinical laboratory testing.    AMPICILLIN/SULBACTAM Value in next row Sensitive      <=1 SENSITIVEThis is a modified FDA-approved test that has been validated and its performance characteristics determined by the reporting laboratory.  This laboratory  is certified under the Clinical Laboratory  Improvement Amendments CLIA as qualified to perform high complexity clinical laboratory testing.    PIP/TAZO Value in next row Sensitive      <=4 SENSITIVEThis is a modified FDA-approved test that has been validated and its performance characteristics determined by the reporting laboratory.  This laboratory is certified under the Clinical Laboratory Improvement Amendments CLIA as qualified to perform high complexity clinical laboratory testing.    MEROPENEM Value in next row Sensitive      <=4 SENSITIVEThis is a modified FDA-approved test that has been validated and its performance characteristics determined by the reporting laboratory.  This laboratory is certified under the Clinical Laboratory Improvement Amendments CLIA as qualified to perform high complexity clinical laboratory testing.    * >=100,000 COLONIES/mL ESCHERICHIA COLI   Klebsiella pneumoniae - MIC*    AMPICILLIN Value in next row Resistant      <=4 SENSITIVEThis is a modified FDA-approved test that has been validated and its performance characteristics determined by the reporting laboratory.  This laboratory is certified under the Clinical Laboratory Improvement Amendments CLIA as qualified to perform high complexity clinical laboratory testing.    CEFAZOLIN (URINE) Value in next row Sensitive      2 SENSITIVEThis is a modified FDA-approved test that has been validated and its performance characteristics determined by the reporting laboratory.  This laboratory is certified under the Clinical Laboratory Improvement Amendments CLIA as qualified to perform high complexity clinical laboratory testing.    CEFEPIME Value in next row Sensitive      2 SENSITIVEThis is a modified FDA-approved test that has been validated and its performance characteristics determined by the reporting laboratory.  This laboratory is certified under the Clinical Laboratory Improvement Amendments CLIA as qualified to perform high complexity clinical laboratory  testing.    ERTAPENEM Value in next row Sensitive      2 SENSITIVEThis is a modified FDA-approved test that has been validated and its performance characteristics determined by the reporting laboratory.  This laboratory is certified under the Clinical Laboratory Improvement Amendments CLIA as qualified to perform high complexity clinical laboratory testing.    CEFTRIAXONE  Value in next row Sensitive      2 SENSITIVEThis is a modified FDA-approved test that has been validated and its performance characteristics determined by the reporting laboratory.  This laboratory is certified under the Clinical Laboratory Improvement Amendments CLIA as qualified to perform high complexity clinical laboratory testing.    CIPROFLOXACIN Value in next row Sensitive      2 SENSITIVEThis is a modified FDA-approved test that has been validated and its performance characteristics determined by the reporting laboratory.  This laboratory is certified under the Clinical Laboratory Improvement Amendments CLIA as qualified to perform high complexity clinical laboratory testing.    GENTAMICIN Value in next row Sensitive      2 SENSITIVEThis is a modified FDA-approved test that has been validated and its performance characteristics determined by the reporting laboratory.  This laboratory is certified under the Clinical Laboratory Improvement Amendments CLIA as qualified to perform high complexity clinical laboratory testing.    NITROFURANTOIN Value in next row Sensitive      2 SENSITIVEThis is a modified FDA-approved test that has been validated and its performance characteristics determined by the reporting laboratory.  This laboratory is certified under the Clinical Laboratory Improvement Amendments CLIA as qualified to perform high complexity clinical laboratory testing.    TRIMETH /SULFA  Value in next row Sensitive      2  SENSITIVEThis is a modified FDA-approved test that has been validated and its performance characteristics  determined by the reporting laboratory.  This laboratory is certified under the Clinical Laboratory Improvement Amendments CLIA as qualified to perform high complexity clinical laboratory testing.    AMPICILLIN/SULBACTAM Value in next row Sensitive      2 SENSITIVEThis is a modified FDA-approved test that has been validated and its performance characteristics determined by the reporting laboratory.  This laboratory is certified under the Clinical Laboratory Improvement Amendments CLIA as qualified to perform high complexity clinical laboratory testing.    PIP/TAZO Value in next row Sensitive      <=4 SENSITIVEThis is a modified FDA-approved test that has been validated and its performance characteristics determined by the reporting laboratory.  This laboratory is certified under the Clinical Laboratory Improvement Amendments CLIA as qualified to perform high complexity clinical laboratory testing.    MEROPENEM Value in next row Sensitive      <=4 SENSITIVEThis is a modified FDA-approved test that has been validated and its performance characteristics determined by the reporting laboratory.  This laboratory is certified under the Clinical Laboratory Improvement Amendments CLIA as qualified to perform high complexity clinical laboratory testing.    * >=100,000 COLONIES/mL KLEBSIELLA PNEUMONIAE     Labs: CBC: Recent Labs  Lab 02/21/24 0452 02/22/24 0526 02/23/24 0633 02/24/24 0403 02/25/24 0413  WBC 13.2* 8.2 7.0 6.2 6.2  HGB 12.3* 10.8* 11.3* 11.0* 11.4*  HCT 37.0* 32.5* 33.8* 32.1* 33.7*  MCV 89.2 89.3 89.2 86.8 88.2  PLT 198 197 220 235 246   Basic Metabolic Panel: Recent Labs  Lab 02/20/24 2139 02/21/24 0452 02/22/24 0526 02/23/24 0633 02/24/24 0403 02/25/24 0413  NA  --  131* 131* 131* 130* 132*  K  --  4.4 4.1 4.5 4.7 5.0  CL  --  99 103 102 100 99  CO2  --  21* 20* 22 21* 23  GLUCOSE  --  99 85 89 102* 101*  BUN  --  36* 44* 47* 49* 43*  CREATININE  --  2.49* 2.27* 2.16*  2.04* 1.89*  CALCIUM   --  9.3 8.4* 8.9 8.7* 8.8*  MG 1.9  --  2.0 2.1 2.0 2.1  PHOS  --   --  2.4* 2.1* 2.4* 2.4*   Liver Function Tests: Recent Labs  Lab 02/20/24 1141 02/22/24 0526  AST 15  --   ALT 9  --   ALKPHOS 120  --   BILITOT 0.8  --   PROT 7.6  --   ALBUMIN 3.8 2.7*   No results for input(s): LIPASE, AMYLASE in the last 168 hours. No results for input(s): AMMONIA in the last 168 hours. Cardiac Enzymes: No results for input(s): CKTOTAL, CKMB, CKMBINDEX, TROPONINI in the last 168 hours. BNP (last 3 results) Recent Labs    04/24/23 1524  BNP >4,500.0*   CBG: No results for input(s): GLUCAP in the last 168 hours.  Time spent: 35 minutes  Signed:  Elvan Sor  [facility] 02/25/2024 2:01 PM

## 2024-02-28 ENCOUNTER — Encounter: Payer: Self-pay | Admitting: *Deleted

## 2024-02-28 ENCOUNTER — Other Ambulatory Visit: Payer: Self-pay

## 2024-02-28 ENCOUNTER — Emergency Department
Admission: EM | Admit: 2024-02-28 | Discharge: 2024-02-28 | Disposition: A | Discharge status: Home / Self Care | Attending: Emergency Medicine | Admitting: Emergency Medicine

## 2024-02-28 DIAGNOSIS — R7989 Other specified abnormal findings of blood chemistry: Secondary | ICD-10-CM | POA: Insufficient documentation

## 2024-02-28 DIAGNOSIS — I509 Heart failure, unspecified: Secondary | ICD-10-CM | POA: Diagnosis not present

## 2024-02-28 DIAGNOSIS — N390 Urinary tract infection, site not specified: Secondary | ICD-10-CM | POA: Diagnosis not present

## 2024-02-28 DIAGNOSIS — R339 Retention of urine, unspecified: Secondary | ICD-10-CM | POA: Diagnosis present

## 2024-02-28 DIAGNOSIS — I2510 Atherosclerotic heart disease of native coronary artery without angina pectoris: Secondary | ICD-10-CM | POA: Diagnosis not present

## 2024-02-28 DIAGNOSIS — I110 Hypertensive heart disease with heart failure: Secondary | ICD-10-CM | POA: Insufficient documentation

## 2024-02-28 LAB — CBC WITH DIFFERENTIAL/PLATELET
Abs Immature Granulocytes: 0.11 K/uL — ABNORMAL HIGH (ref 0.00–0.07)
Basophils Absolute: 0 K/uL (ref 0.0–0.1)
Basophils Relative: 0 %
Eosinophils Absolute: 0.1 K/uL (ref 0.0–0.5)
Eosinophils Relative: 1 %
HCT: 37.4 % — ABNORMAL LOW (ref 39.0–52.0)
Hemoglobin: 12.4 g/dL — ABNORMAL LOW (ref 13.0–17.0)
Immature Granulocytes: 1 %
Lymphocytes Relative: 10 %
Lymphs Abs: 0.8 K/uL (ref 0.7–4.0)
MCH: 29.7 pg (ref 26.0–34.0)
MCHC: 33.2 g/dL (ref 30.0–36.0)
MCV: 89.7 fL (ref 80.0–100.0)
Monocytes Absolute: 0.6 K/uL (ref 0.1–1.0)
Monocytes Relative: 7 %
Neutro Abs: 7.1 K/uL (ref 1.7–7.7)
Neutrophils Relative %: 81 %
Platelets: 318 K/uL (ref 150–400)
RBC: 4.17 MIL/uL — ABNORMAL LOW (ref 4.22–5.81)
RDW: 16.7 % — ABNORMAL HIGH (ref 11.5–15.5)
WBC: 8.7 K/uL (ref 4.0–10.5)
nRBC: 0 % (ref 0.0–0.2)

## 2024-02-28 LAB — BASIC METABOLIC PANEL WITH GFR
Anion gap: 11 (ref 5–15)
BUN: 32 mg/dL — ABNORMAL HIGH (ref 8–23)
CO2: 21 mmol/L — ABNORMAL LOW (ref 22–32)
Calcium: 9.4 mg/dL (ref 8.9–10.3)
Chloride: 99 mmol/L (ref 98–111)
Creatinine, Ser: 2.12 mg/dL — ABNORMAL HIGH (ref 0.61–1.24)
GFR, Estimated: 33 mL/min — ABNORMAL LOW (ref >60)
Glucose, Bld: 131 mg/dL — ABNORMAL HIGH (ref 70–99)
Potassium: 4.3 mmol/L (ref 3.5–5.1)
Sodium: 132 mmol/L — ABNORMAL LOW (ref 135–145)

## 2024-02-28 LAB — URINALYSIS, W/ REFLEX TO CULTURE (INFECTION SUSPECTED)
Bilirubin Urine: NEGATIVE
Glucose, UA: NEGATIVE mg/dL
Ketones, ur: NEGATIVE mg/dL
Nitrite: NEGATIVE
Protein, ur: 30 mg/dL — AB
RBC / HPF: >50 RBC/hpf (ref 0–5)
Specific Gravity, Urine: 1.013 (ref 1.005–1.030)
WBC, UA: >50 WBC/hpf (ref 0–5)
pH: 6 (ref 5.0–8.0)

## 2024-02-28 MED ORDER — SULFAMETHOXAZOLE-TRIMETHOPRIM 800-160 MG PO TABS
1.0000 | ORAL_TABLET | Freq: Once | ORAL | Status: AC
  Administered 2024-02-28: 1 via ORAL
  Filled 2024-02-28: qty 1

## 2024-02-28 NOTE — ED Notes (Signed)
 Bladder scan greater than 554 mls  pt taken to room 5

## 2024-02-28 NOTE — ED Notes (Signed)
 Pt given crackers and water.

## 2024-02-28 NOTE — ED Provider Notes (Signed)
 [facility] Provider Note    Event Date/Time   First MD Initiated Contact with Patient 02/28/24 1845     (approximate)   History   Urinary Retention   HPI  Dennis Church is a 68 y.o. 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  Patient states that he has a Foley in place, woke up today and the bag was empty, was concerned that it was obstructed.  States that he was a pair of scissors and cut the Foley and pulled out the rest of the Foley with the balloon attached.  States that he is unable to void.  States that he is not taking his antibiotics for his UTI.  No fever or abdominal pain, no nausea vomiting or diarrhea.  On independent chart review, he was admitted in November for sepsis secondary to UTI, also has history of urinary tension secondary to severe BPH, was offered surgical solution by urology but he declined.  His urine culture from November 17 grew E. coli and pneumonia susceptible to ceftriaxone .  He was discharged with Bactrim .     Physical Exam   Triage Vital Signs: ED Triage Vitals  Encounter Vitals Group     BP 02/28/24 1833 (!) 170/104     Girls Systolic BP Percentile --      Girls Diastolic BP Percentile --      Boys Systolic BP Percentile --      Boys Diastolic BP Percentile --      Pulse Rate 02/28/24 1833 (!) 113     Resp 02/28/24 1833 (!) 22     Temp 02/28/24 1833 97.8 F (36.6 C)     Temp Source 02/28/24 1833 Oral     SpO2 02/28/24 1833 100 %     Weight 02/28/24 1831 170 lb (77.1 kg)     Height 02/28/24 1831 5' 6 (1.676 m)     Head Circumference --      Peak Flow --      Pain Score 02/28/24 1830 10     Pain Loc --      Pain Education --      Exclude from Growth Chart --     Most recent vital signs: Vitals:   02/28/24 1930 02/28/24 2000  BP: (!) 155/84 (!) 159/85  Pulse: 86 80  Resp:  18  Temp:    SpO2: 99% 100%     General: Awake, no  distress.  CV:  Good peripheral perfusion.  Resp:  Normal effort.  Abd:  No distention.  Soft nontender Other:  GU exam was done as chaperone, uncircumcised penis, do not see any Foley tube extending from the penis.  No drainage.  Nontender to palpation.   ED Results / Procedures / Treatments   Labs (all labs ordered are listed, but only abnormal results are displayed) Labs Reviewed  CBC WITH DIFFERENTIAL/PLATELET - Abnormal; Notable for the following components:      Result Value   RBC 4.17 (*)    Hemoglobin 12.4 (*)    HCT 37.4 (*)    RDW 16.7 (*)    Abs Immature Granulocytes 0.11 (*)    All other components within normal limits  BASIC METABOLIC PANEL WITH GFR - Abnormal; Notable for the following components:   Sodium 132 (*)    CO2 21 (*)    Glucose, Bld 131 (*)    BUN 32 (*)    Creatinine, Ser  2.12 (*)    GFR, Estimated 33 (*)    All other components within normal limits  URINALYSIS, W/ REFLEX TO CULTURE (INFECTION SUSPECTED) - Abnormal; Notable for the following components:   Color, Urine YELLOW (*)    APPearance HAZY (*)    Hgb urine dipstick LARGE (*)    Protein, ur 30 (*)    Leukocytes,Ua MODERATE (*)    Bacteria, UA RARE (*)    All other components within normal limits  URINE CULTURE     PROCEDURES:  Critical Care performed: No  Procedures   MEDICATIONS ORDERED IN ED: Medications  sulfamethoxazole -trimethoprim  (BACTRIM  DS) 800-160 MG per tablet 1 tablet (has no administration in time range)     IMPRESSION / MDM / ASSESSMENT AND PLAN / ED COURSE  I reviewed the triage vital signs and the nursing notes.                              Differential diagnosis includes, but is not limited to, urinary retention, UTI, based on patient's description and his exam he does seems like he pulled out the entire balloon and Foley.  Get labs, UA, plan to reinsert Foley catheter.  Had bladder scan in triage that showed a volume of 554 mL.  Patient's presentation is  most consistent with acute presentation with potential threat to life or bodily function.  Independent interpretation of labs below.  Patient had his Foley replaced, states that he might have taken his antibiotics today, will give him a dose of Bactrim  here.  Discussed with him about completing his course of antibiotics for his UTI.  Discussed with him about outpatient follow-up with urology for further management of his Foley.  Otherwise he is well-appearing, vitals are stable.  Considered but no indication for inpatient mission at this time, he safe for outpatient management.  Will discharge with strict return precautions.  Shared decision making done with patient he is agreeable with this plan.   Clinical Course as of 02/28/24 2029  Tue Feb 28, 2024  2025 Independent review of labs, no leukocytosis, electrolytes not severely deranged, creatinine is elevated but will compare to prior, UA is consistent with UTI.  Patient states that he has been on his antibiotics.  Will give him a dose here. [TT]    Clinical Course User Index [TT] Waymond Lorelle Cummins, MD     FINAL CLINICAL IMPRESSION(S) / ED DIAGNOSES   Final diagnoses:  Urinary retention  Urinary tract infection without hematuria, site unspecified     Rx / DC Orders   ED Discharge Orders     None        Note:  This document was prepared using Dragon voice recognition software and may include unintentional dictation errors.    Waymond Lorelle Cummins, MD 02/28/24 2029

## 2024-02-28 NOTE — ED Triage Notes (Signed)
 Pt ambulatory to triage.  Pt states he cut his urinary catheter today and took it out.  Now pt can not void.  Pt was here recently for catheter placement.  Pt alert.

## 2024-02-28 NOTE — Discharge Instructions (Signed)
 Please make sure to take the rest of your antibiotics as prescribed from your last admission.  Please also follow-up with urology for further management of your Foley.  I put in number for you to call.

## 2024-03-01 LAB — URINE CULTURE: Culture: >=100000 — AB

## 2024-03-21 ENCOUNTER — Ambulatory Visit: Admitting: Urology

## 2024-03-21 ENCOUNTER — Other Ambulatory Visit: Payer: Self-pay

## 2024-03-21 VITALS — BP 208/128 | HR 94 | Ht 70.0 in | Wt 170.0 lb

## 2024-03-21 DIAGNOSIS — R338 Other retention of urine: Secondary | ICD-10-CM

## 2024-03-21 DIAGNOSIS — N401 Enlarged prostate with lower urinary tract symptoms: Secondary | ICD-10-CM | POA: Diagnosis not present

## 2024-03-21 DIAGNOSIS — Z01818 Encounter for other preprocedural examination: Secondary | ICD-10-CM

## 2024-03-21 NOTE — Progress Notes (Unsigned)
 Surgical Physician Order Form [facility]  Dr. Redell Burnet, MD  * Scheduling expectation : Next Available  *Length of Case: 1.5 hours  *Clearance needed: yes, cardiology  *Anticoagulation Instructions: Hold all anticoagulants, okay to continue 81 mg aspirin  if needed per cardiology  *Aspirin  Instructions: Can continue 81 mg aspirin  if needed per cardiology  *Post-op visit Date/Instructions:  1-3 day cath removal  *Diagnosis: BPH w/urinary obstruction, bladder stone  *Procedure:  HOLEP (47350), cystolitholapaxy <2cm   Additional orders: N/A  -Admit type: OUTpatient(patient may not have family to stay with him, need to schedule on a Monday so if we have to watch him overnight is not the weekend)  -Anesthesia: General  -VTE Prophylaxis Standing Order SCD's       Other:   -Standing Lab Orders Per Anesthesia    Lab other: UA&Urine Culture sent 12/17  -Standing Test orders EKG/Chest x-ray per Anesthesia       Test other:   - Medications:  Ancef 2gm IV  -Other orders:  N/A

## 2024-03-21 NOTE — Progress Notes (Signed)
° °  03/21/2024 11:10 AM   Christopher LITTIE Pizza 1956/01/30 [id number]  Reason for visit: Follow up BPH with obstruction, bladder stone, elevated PSA  History: Multiple visits with ER and urology for urinary retention requiring Foley, he has self removed multiple catheters at home and not followed up for outlet procedure as recommended Most recent hospitalization November 2025 for UTI, retention with greater than 1 L after Foley placement, and acute on chronic kidney injury, also has 2.3 cm bladder stone Extensive cardiac history, has not been seen by cardiology recently Self removed Foley at home late November 2025 requiring ER visit 02/28/2024 for Foley replacement  Physical Exam: BP (!) 208/128 (BP Location: Left Arm, Patient Position: Sitting, Cuff Size: Small)   Pulse 94   Ht 5' 10 (1.778 m)   Wt 170 lb (77.1 kg)   SpO2 98%   BMI 24.39 kg/m  Frail-appearing, disheveled Foley with yellow urine  Imaging/labs: I personally viewed and interpreted the CT from September 2025 showing 105 g prostate, 2 cm bladder stone, no hydronephrosis Prior culture data reviewed PSA 7.2 from January 2025  Today: He is at the point where he is ready to pursue outlet procedures to resume spontaneous voiding Denies any chest pain or shortness of breath Due for Foley change today  Plan:   BPH with retention: Recurrent episodes of retention and recurrent UTIs, bladder stone, multiple indications for outlet procedure and would recommend HOLEP based on prostate volume of 105 g with 2 cm bladder stone. We discussed the risks and benefits of HoLEP at length.  The procedure requires general anesthesia and takes 1 to 2 hours, and a holmium laser is used to enucleate the prostate and push this tissue into the bladder.  A morcellator is then used to remove this tissue, which is sent for pathology.  The vast majority(>95%) of patients are able to discharge the same day with a catheter in place for 2 to 3 days, and will  follow-up in clinic for a voiding trial.  We specifically discussed the risks of bleeding, infection, retrograde ejaculation, temporary urgency and urge incontinence, very low risk of long-term incontinence, urethral stricture/bladder neck contracture, pathologic evaluation of prostate tissue and possible detection of prostate cancer or other malignancy, and possible need for additional procedures. Elevated PSA: Most likely related to retention, incomplete emptying, UTIs, discussed low risk of prostate cancer that would require additional treatment at time of HOLEP, low PSA density Foley catheter exchanged today, preop urine culture sent Schedule HOLEP(105g) and cystolitholapaxy(2 cm), will need cardiology clearance.  He has a son but not reliable to stay with him overnight, may need to monitor overnight observation if no ride home/family overnight   Redell JAYSON Burnet, MD  [facility] [address] [place], [place] [address] [phone]

## 2024-03-21 NOTE — Progress Notes (Signed)
 Cath Change/ Replacement  Patient is present today for a catheter change due to urinary retention.  10ml of water was removed from the balloon, unable to tell what size cath was removed but without difficulty. Not documented in ED. Patient was cleaned and prepped in a sterile fashion with betadine. A 18 FR foley cath was replaced into the bladder, no complications were noted. Urine return was noted 15ml and urine was cloudy yellow in color. The balloon was filled with 10ml of sterile water. A night bag was attached for drainage.  A night bag was also given to the patient and patient was given instruction on how to change from one bag to another. Patient was given proper instruction on catheter care.    Performed by: Beauford Browner, CCMA  Follow up: Eleanor will call for surgery

## 2024-03-21 NOTE — Patient Instructions (Signed)

## 2024-03-22 LAB — URINALYSIS, COMPLETE
Bilirubin, UA: NEGATIVE
Glucose, UA: NEGATIVE
Ketones, UA: NEGATIVE
Nitrite, UA: NEGATIVE
Specific Gravity, UA: 1.02 (ref 1.005–1.030)
Urobilinogen, Ur: 0.2 mg/dL (ref 0.2–1.0)
pH, UA: 7 (ref 5.0–7.5)

## 2024-03-22 LAB — MICROSCOPIC EXAMINATION: WBC, UA: >30 /HPF — AB (ref 0–5)

## 2024-03-22 NOTE — Progress Notes (Signed)
 Patient is not willing to see Cardiology prior to surgery. Patient states that I will never see a Cardiologist again as long as I live, I do just fine on my own. Put me to sleep right now and I'll guarantee I will wake up with no issues. Explained to patient that our Anesthesia group will not sign off on this, Cardiology consult given patients history must occur. Patient states that he will continue with his catheter and get it exchanged monthly. Spoke with Dr. Francisca who also agreed that we must proceed with Cardiology Clearance before surgery. Encouraged patient to keep monthly catheter appts or we are willing to refer to another office for a second opinion.

## 2024-03-29 LAB — CULTURE, URINE COMPREHENSIVE

## 2024-04-19 ENCOUNTER — Ambulatory Visit: Admitting: Physician Assistant

## 2024-04-19 DIAGNOSIS — Z466 Encounter for fitting and adjustment of urinary device: Secondary | ICD-10-CM

## 2024-04-19 NOTE — Progress Notes (Signed)
 Cath Change/ Replacement  Patient is present today for a catheter change due to urinary retention.  8ml of water was removed from the balloon, a 18FR coud foley cath was removed without difficulty.  Patient was cleaned and prepped in a sterile fashion with betadine and 2% lidocaine  jelly was instilled into the urethra. A 18 FR coud foley cath was replaced into the bladder, no complications were noted. Urine return was noted 10ml and urine was yellow in color. The balloon was filled with 10ml of sterile water. A night bag was attached for drainage.  Patient tolerated well.    Performed by: Fonda Rochon, PA-C and Gabrielle Aiden, PA-C  Follow up: Return in about 4 weeks (around 05/17/2024) for Catheter exchange.

## 2024-05-17 ENCOUNTER — Ambulatory Visit
